# Patient Record
Sex: Male | Born: 1990 | Race: Black or African American | Hispanic: No | Marital: Single | Smoking: Never smoker
Health system: Southern US, Community
[De-identification: ages and names within clinical notes are randomized; demographics above are authoritative.]

## PROBLEM LIST (undated history)

## (undated) DIAGNOSIS — D573 Sickle-cell trait: Secondary | ICD-10-CM

## (undated) DIAGNOSIS — R51 Headache: Secondary | ICD-10-CM

## (undated) DIAGNOSIS — D7218 Eosinophilia in diseases classified elsewhere: Secondary | ICD-10-CM

## (undated) DIAGNOSIS — F419 Anxiety disorder, unspecified: Secondary | ICD-10-CM

## (undated) DIAGNOSIS — M301 Polyarteritis with lung involvement [Churg-Strauss]: Secondary | ICD-10-CM

## (undated) DIAGNOSIS — T7840XA Allergy, unspecified, initial encounter: Secondary | ICD-10-CM

## (undated) DIAGNOSIS — Z889 Allergy status to unspecified drugs, medicaments and biological substances status: Secondary | ICD-10-CM

## (undated) HISTORY — DX: Allergy, unspecified, initial encounter: T78.40XA

## (undated) HISTORY — DX: Headache: R51

---
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
## 2011-02-21 NOTE — Consult Note (Signed)
[facility]  Patient:    Larry Estes, Larry Estes                        MRN: [id number] Proc. Date: 02/14/00 Attending:  Artist Pais. Mina Estes, M.D. CC:         Larry Estes, M.D. 2 copies to office                          Consultation Report  PHYSICIAN REQUESTING CONSULTATION:  Larry Estes, M.D.  REASON FOR CONSULTATION:  Larry Estes is a very pleasant [age]-year-old male, right hand dominant, who sustained a traumatic injury to his dominant right ring finger earlier today, and is here with an open injury of the distal phalanx, nail bed laceration, and open fracture of the distal phalanx.  PAST MEDICAL HISTORY:  He is an otherwise healthy [age]-year-old male with seasonal allergies.  MEDICATIONS:  He is currently on Claritin.  ALLERGIES:  No known drug allergies.  No other medications except Claritin or past surgical history of note.  SOCIAL HISTORY:  Noncontributory.  FAMILY MEDICAL HISTORY:  Noncontributory.  REVIEW OF SYSTEMS:  None significant.  PHYSICAL EXAMINATION:  GENERAL:  Well-developed, well-nourished male, alert and oriented x 3.  HEENT:  Normocephalic, atraumatic.  RIGHT UPPER EXTREMITY:  He has an obvious open injury to his right ring finger with a fracture of the nail plate, a nail bed laceration, an open distal phalangeal fracture, and lacerations on both the radial and ulnar borders of the eponychium.  Also has a volar laceration.  Patients x-rays show no distal dislocation but a distal phalangeal fracture.  The tip of his finger is sensate and he has full FDS and FDP function.  PROCEDURE:  The patient was given a 2% plain lidocaine digital sheath block. Once adequate anesthesia had been obtained, his right hand was prepped and draped in the usual sterile fashion.  A Penrose drain was used as a tourniquet at the base of his finger.  At this point in time, the nail plate was removed from the underlying nail bed.   The fracture site was irrigated and debrided of all clot and all nonviable material, and then at this point in time, a 4-0 Vicryl suture undyed was used to repair the lacerations on the volar aspect of the finger as well as on the radial and ulnar borders of the eponychium.  Once adequate alignment had been obtained, the nail bed was sutured using 6-0 Vicryl in four simple sutures.  The nail plate was then placed back under the eponychial fold.  The patients finger was then dressed in Xeroform, 4x4s, Coban wrap, and a volar extension splint with the DIP joint in extension.  DISPOSITION:  Patient was discharged from the emergency department with Keflex 50 mg 1 p.o. b.i.d. for a week for antibiotic prophylaxis as well as Tylenol with codeine elixir 125 mg/5 cc two teaspoons q.6h. p.r.n. pain., #200 cc bottle, and is to follow up in my office in five to seven days.DD:  02/14/00 TD:  02/15/00 Job: [id number]

## 2011-07-07 LAB — WOUND CULTURE: Gram Stain: NONE SEEN

## 2011-07-08 LAB — RAPID STREP SCREEN (MED CTR MEBANE ONLY): Streptococcus, Group A Screen (Direct): NEGATIVE

## 2011-07-16 LAB — POCT RAPID STREP A: Streptococcus, Group A Screen (Direct): NEGATIVE

## 2011-07-22 LAB — URINALYSIS, ROUTINE W REFLEX MICROSCOPIC
Glucose, UA: NEGATIVE
Hgb urine dipstick: NEGATIVE
Ketones, ur: NEGATIVE
Protein, ur: NEGATIVE

## 2011-07-22 LAB — COMPREHENSIVE METABOLIC PANEL
ALT: 25
Alkaline Phosphatase: 102
CO2: 26
Chloride: 102
Glucose, Bld: 99
Potassium: 3.8
Sodium: 136
Total Protein: 8

## 2011-07-22 LAB — DIFFERENTIAL
Basophils Relative: 1
Eosinophils Absolute: 0.4
Monocytes Absolute: 0.7
Neutrophils Relative %: 40

## 2011-07-22 LAB — CBC
Hemoglobin: 14.8 — ABNORMAL HIGH
RBC: 5.66 — ABNORMAL HIGH
RDW: 16.1 — ABNORMAL HIGH
WBC: 5.5

## 2011-08-30 ENCOUNTER — Emergency Department (HOSPITAL_COMMUNITY)
Admission: EM | Admit: 2011-08-30 | Discharge: 2011-08-30 | Disposition: A | Discharge status: Home / Self Care | Payer: Self-pay | Attending: Emergency Medicine | Admitting: Emergency Medicine

## 2011-08-30 ENCOUNTER — Emergency Department (HOSPITAL_COMMUNITY): Payer: Self-pay

## 2011-08-30 ENCOUNTER — Encounter: Payer: Self-pay | Admitting: Emergency Medicine

## 2011-08-30 DIAGNOSIS — R109 Unspecified abdominal pain: Secondary | ICD-10-CM | POA: Insufficient documentation

## 2011-08-30 DIAGNOSIS — K625 Hemorrhage of anus and rectum: Secondary | ICD-10-CM | POA: Insufficient documentation

## 2011-08-30 DIAGNOSIS — K648 Other hemorrhoids: Secondary | ICD-10-CM | POA: Insufficient documentation

## 2011-08-30 DIAGNOSIS — R10819 Abdominal tenderness, unspecified site: Secondary | ICD-10-CM | POA: Insufficient documentation

## 2011-08-30 HISTORY — DX: Eosinophilia in diseases classified elsewhere: D72.18

## 2011-08-30 HISTORY — DX: Polyarteritis with lung involvement (churg-strauss): M30.1

## 2011-08-30 LAB — CBC
HCT: 42.4 % (ref 39.0–52.0)
MCHC: 34.4 g/dL (ref 30.0–36.0)
MCV: 77.4 fL — ABNORMAL LOW (ref 78.0–100.0)
RDW: 13.5 % (ref 11.5–15.5)

## 2011-08-30 LAB — COMPREHENSIVE METABOLIC PANEL
Albumin: 4 g/dL (ref 3.5–5.2)
BUN: 11 mg/dL (ref 6–23)
Creatinine, Ser: 0.92 mg/dL (ref 0.50–1.35)
Total Protein: 7.6 g/dL (ref 6.0–8.3)

## 2011-08-30 LAB — PROTIME-INR
INR: 0.97 (ref 0.00–1.49)
Prothrombin Time: 13.1 seconds (ref 11.6–15.2)

## 2011-08-30 LAB — TYPE AND SCREEN: Antibody Screen: NEGATIVE

## 2011-08-30 MED ORDER — FENTANYL CITRATE 0.05 MG/ML IJ SOLN
INTRAMUSCULAR | Status: AC
  Administered 2011-08-30: 100 ug via INTRAVENOUS
  Filled 2011-08-30: qty 2

## 2011-08-30 MED ORDER — TRAMADOL-ACETAMINOPHEN 37.5-325 MG PO TABS: ORAL_TABLET | ORAL | Status: AC

## 2011-08-30 MED ORDER — PROMETHAZINE HCL 25 MG PO TABS: 25.0000 mg | ORAL_TABLET | Freq: Four times a day (QID) | ORAL | Status: DC | PRN

## 2011-08-30 MED ORDER — HYDROCORTISONE ACETATE 25 MG RE SUPP: 25.0000 mg | Freq: Two times a day (BID) | RECTAL | Status: AC

## 2011-08-30 MED ORDER — FENTANYL CITRATE 0.05 MG/ML IJ SOLN
100.0000 ug | Freq: Once | INTRAMUSCULAR | Status: AC
  Administered 2011-08-30: 100 ug via INTRAVENOUS

## 2011-08-30 MED ORDER — IOHEXOL 300 MG/ML  SOLN
100.0000 mL | Freq: Once | INTRAMUSCULAR | Status: AC | PRN
  Administered 2011-08-30: 100 mL via INTRAVENOUS

## 2011-08-30 NOTE — ED Notes (Signed)
Pt c/o abd pain, rectal bleeding x 3 months, pt states tonight has BM with bright red and dark red clots. EMTs advised come to ED for eval

## 2011-08-30 NOTE — ED Provider Notes (Signed)
Left knee at change of shift to get CT abdomen pelvis results. Patient relates she's had lower abdominal pain constantly for 3 months and is now having rectal bleeding. He started out as blood when he wiped now he states "I have a constant dripping of blood" when I entered the room nurses had just Tommy patient needed more pain medicine however he is heartily eating  his breakfast in no distress.  Results for orders placed during the hospital encounter of 08/30/11  CBC      Component Value Range   WBC 6.2  4.0 - 10.5 (K/uL)   RBC 5.48  4.22 - 5.81 (MIL/uL)   Hemoglobin 14.6  13.0 - 17.0 (g/dL)   HCT 16.1  09.6 - 04.5 (%)   MCV 77.4 (*) 78.0 - 100.0 (fL)   MCH 26.6  26.0 - 34.0 (pg)   MCHC 34.4  30.0 - 36.0 (g/dL)   RDW 40.9  81.1 - 91.4 (%)   Platelets 272  150 - 400 (K/uL)  COMPREHENSIVE METABOLIC PANEL      Component Value Range   Sodium 140  135 - 145 (mEq/L)   Potassium 3.6  3.5 - 5.1 (mEq/L)   Chloride 102  96 - 112 (mEq/L)   CO2 29  19 - 32 (mEq/L)   Glucose, Bld 95  70 - 99 (mg/dL)   BUN 11  6 - 23 (mg/dL)   Creatinine, Ser 7.82  0.50 - 1.35 (mg/dL)   Calcium 9.9  8.4 - 95.6 (mg/dL)   Total Protein 7.6  6.0 - 8.3 (g/dL)   Albumin 4.0  3.5 - 5.2 (g/dL)   AST 31  0 - 37 (U/L)   ALT 35  0 - 53 (U/L)   Alkaline Phosphatase 62  39 - 117 (U/L)   Total Bilirubin 0.2 (*) 0.3 - 1.2 (mg/dL)   GFR calc non Af Amer >90  >90 (mL/min)   GFR calc Af Amer >90  >90 (mL/min)  PROTIME-INR      Component Value Range   Prothrombin Time 13.1  11.6 - 15.2 (seconds)   INR 0.97  0.00 - 1.49   TYPE AND SCREEN      Component Value Range   ABO/RH(D) A POS     Antibody Screen PENDING     Sample Expiration 09/02/2011    OCCULT BLOOD, POC DEVICE      Component Value Range   Fecal Occult Bld POSITIVE     Laboratory interpretation all normal except for positive Hemoccult for blood  Ct Abdomen Pelvis W Contrast  08/30/2011  *RADIOLOGY REPORT*  Clinical Data: Abdominal pain and rectal bleeding   CT ABDOMEN AND PELVIS WITH CONTRAST  Technique:  Multidetector CT imaging of the abdomen and pelvis was performed following the standard protocol during bolus administration of intravenous contrast.  Contrast: OMNIPAQUE IOHEXOL 300 MG/ML IV SOLN  Comparison: None.  Findings: Visualized lung bases are clear.  Liver is normal.  Spleen is normal.  Pancreas is normal.  The adrenal glands are normal.   The kidneys are normal.  Gallbladder is unremarkable by CT.  No biliary ductal dilation.  Stomach and visualized large and small bowel are unremarkable.  The appendix is identified and appears normal  Abdominal aorta normal is in caliber.  No significant lymphadenopathy.  No free fluid or abnormal fluid collections.  IMPRESSION:  1.  No acute findings. Normal exam.  Original Report Authenticated By: Rosealee Albee, M.D.    Diagnoses that have been  ruled out:  Diagnoses that are still under consideration:  Final diagnoses:  Abdominal pain  Rectal bleeding   Patient's Medications  New Prescriptions   HYDROCORTISONE (ANUSOL-HC) 25 MG SUPPOSITORY    Place 1 suppository (25 mg total) rectally 2 (two) times daily.   PROMETHAZINE (PHENERGAN) 25 MG TABLET    Take 1 tablet (25 mg total) by mouth every 6 (six) hours as needed for nausea.   TRAMADOL-ACETAMINOPHEN (ULTRACET) 37.5-325 MG PER TABLET    2 tabs po QID prn pain  Previous Medications   No medications on file  Modified Medications   No medications on file  Discontinued Medications   No medications on file    Plan discharge and refer to GI (Dr Loreta Ave and Dr Elnoria Howard)   10:30 MOP now in room, upset stating he has trouble having BM's and has to strain. Will give advice on constipation.   Devoria Albe, MD, Armando Gang   Ward Givens, MD 08/30/11 [phone]

## 2011-08-30 NOTE — ED Provider Notes (Addendum)
History     CSN: [id number] Arrival date & time: 08/30/2011  2:05 AM   First MD Initiated Contact with Patient 08/30/11 0510      Chief Complaint  Patient presents with  . Abdominal Pain  . Rectal Bleeding    (Consider location/radiation/quality/duration/timing/severity/associated sxs/prior treatment) HPI 20 year old male presents to emergency department with report of 3 months of rectal bleeding and intermittent lower abdominal pain. Patient reports over the last 24 hours he has had worsening lower abdominal pain, copious amounts of rectal bleeding, and vomiting. Patient denies previously being worked up for same. Patient does not know of any family history of irritable bowel disease. He's had no fevers. Patient reports chronic constipation for which he takes laxatives about twice a week. Patient has not had a bowel movement in 2 days despite laxatives. He denies history of hemorrhoids  Past Medical History  Diagnosis Date  . Allergic angiitis     History reviewed. No pertinent past surgical history.  History reviewed. No pertinent family history.  History  Substance Use Topics  . Smoking status: Never Smoker   . Smokeless tobacco: Not on file  . Alcohol Use: No      Review of Systems  All other systems reviewed and are negative.    Allergies  Review of patient's allergies indicates no known allergies.  Home Medications  No current outpatient prescriptions on file.  BP 136/60  Pulse 81  Temp(Src) 99 F (37.2 C) (Oral)  Resp 19  SpO2 100%  Physical Exam  Nursing note and vitals reviewed. Constitutional: He appears well-developed and well-nourished.  HENT:  Head: Normocephalic and atraumatic.  Right Ear: External ear normal.  Left Ear: External ear normal.  Nose: Nose normal.  Mouth/Throat: Oropharynx is clear and moist.  Eyes: Conjunctivae and EOM are normal. Pupils are equal, round, and reactive to light.  Neck: Normal range of motion. Neck supple. No  JVD present. No tracheal deviation present. No thyromegaly present.  Cardiovascular: Normal rate, regular rhythm, normal heart sounds and intact distal pulses.  Exam reveals no gallop and no friction rub.   No murmur heard. Pulmonary/Chest: Effort normal and breath sounds normal. No stridor. No respiratory distress. He has no wheezes. He has no rales. He exhibits no tenderness.  Abdominal: Soft. Bowel sounds are normal. He exhibits no distension and no mass. There is tenderness. There is no rebound and no guarding.       Tenderness to suprapubic and left lower quadrant with palpation  Genitourinary:       Small internal hemorrhoids noted, nontender bright red blood in the rectum  Musculoskeletal: Normal range of motion. He exhibits no edema and no tenderness.  Lymphadenopathy:    He has no cervical adenopathy.  Skin: Skin is dry. No rash noted. No erythema. No pallor.  Psychiatric: He has a normal mood and affect. His behavior is normal. Judgment and thought content normal.    ED Course  Procedures (including critical care time)   Labs Reviewed  OCCULT BLOOD, POC DEVICE  CBC  COMPREHENSIVE METABOLIC PANEL  PROTIME-INR  TYPE AND SCREEN  POCT OCCULT BLOOD STOOL, DEVICE   No results found.   No diagnosis found.    MDM  20 year old male with 3 months of abdominal pain and intermittent rectal bleeding now worsening. Will get baseline labs, CT abdomen pelvis for possible ulcerative colitis or Crohn's disease. Discussed plan with family wishes if labs and CT scan did not show indications for admission he will need referral  to GI        Olivia Mackie, MD 08/30/11 [phone]  8:22 AM Care passed to Dr. Lynelle Doctor awaiting labs and CT scan  Olivia Mackie, MD 08/30/11 [phone]

## 2012-07-26 ENCOUNTER — Emergency Department (HOSPITAL_COMMUNITY)
Admission: EM | Admit: 2012-07-26 | Discharge: 2012-07-26 | Disposition: A | Discharge status: Home / Self Care | Payer: Medicaid Other | Attending: Emergency Medicine | Admitting: Emergency Medicine

## 2012-07-26 ENCOUNTER — Encounter (HOSPITAL_COMMUNITY): Payer: Self-pay | Admitting: *Deleted

## 2012-07-26 DIAGNOSIS — J069 Acute upper respiratory infection, unspecified: Secondary | ICD-10-CM

## 2012-07-26 DIAGNOSIS — R05 Cough: Secondary | ICD-10-CM | POA: Insufficient documentation

## 2012-07-26 DIAGNOSIS — R059 Cough, unspecified: Secondary | ICD-10-CM | POA: Insufficient documentation

## 2012-07-26 DIAGNOSIS — IMO0001 Reserved for inherently not codable concepts without codable children: Secondary | ICD-10-CM | POA: Insufficient documentation

## 2012-07-26 LAB — RAPID STREP SCREEN (MED CTR MEBANE ONLY): Streptococcus, Group A Screen (Direct): NEGATIVE

## 2012-07-26 MED ORDER — BENZONATATE 100 MG PO CAPS: 100.0000 mg | ORAL_CAPSULE | Freq: Three times a day (TID) | ORAL | Status: DC

## 2012-07-26 MED ORDER — IBUPROFEN 600 MG PO TABS: 600.0000 mg | ORAL_TABLET | Freq: Four times a day (QID) | ORAL | Status: DC | PRN

## 2012-07-26 NOTE — ED Notes (Signed)
Pt states sore throat for the past 2 weeks. Pt states flu like symptoms as well. Pt denies CP, SOB, HA. Pt ambulatory denies productive cough.

## 2012-07-26 NOTE — ED Provider Notes (Signed)
History     CSN: [id number]  Arrival date & time 07/26/12  7829   First MD Initiated Contact with Patient 07/26/12 [phone]      Chief Complaint  Patient presents with  . Sore Throat    (Consider location/radiation/quality/duration/timing/severity/associated sxs/prior treatment) HPI Comments: Larry Estes is a 21 y.o. Male who presents with complaint of a sore throat, body aches, chills, cough for about 1-2 weeks. States it started with a mild cough, but now symptoms are worsening. States chills at home but has not checked his temperature. denies difficulty swallowing or breathing. Denies headache or neck stiffness. States cough is productive with clear thick sputum. Taking over the counter cold medication for the last 3 days with no improvement. Nothing makes symptoms better or worse.   The history is provided by the patient.    Past Medical History  Diagnosis Date  . Allergic angiitis     History reviewed. No pertinent past surgical history.  History reviewed. No pertinent family history.  History  Substance Use Topics  . Smoking status: Never Smoker   . Smokeless tobacco: Not on file  . Alcohol Use: No      Review of Systems  Constitutional: Positive for chills. Negative for fever.  HENT: Positive for congestion and sore throat. Negative for ear pain, trouble swallowing, neck pain, neck stiffness and voice change.   Eyes: Negative for discharge.  Respiratory: Positive for cough. Negative for chest tightness and shortness of breath.   Cardiovascular: Negative for chest pain.  Gastrointestinal: Negative for nausea, vomiting and abdominal distention.  Musculoskeletal: Positive for myalgias.  Skin: Negative for rash.  Neurological: Negative for dizziness, weakness and headaches.  Hematological: Negative for adenopathy.    Allergies  Review of patient's allergies indicates no known allergies.  Home Medications  No current outpatient prescriptions on file.  BP  134/84  Temp 98.1 F (36.7 C) (Oral)  Resp 18  SpO2 100%  Physical Exam  Nursing note and vitals reviewed. Constitutional: He appears well-developed and well-nourished. No distress.  HENT:  Head: Normocephalic.  Right Ear: External ear normal.  Left Ear: External ear normal.  Nose: Nose normal.       TMs normal bilaterally. Pharynx and tonsils erythemotous. No exudate. Uvula midline  Eyes: Conjunctivae normal are normal.  Neck: Normal range of motion. Neck supple.  Cardiovascular: Normal rate, regular rhythm and normal heart sounds.   Pulmonary/Chest: Effort normal and breath sounds normal. No respiratory distress. He has no wheezes. He has no rales.  Musculoskeletal: He exhibits no edema.  Lymphadenopathy:    He has no cervical adenopathy.  Neurological: He is alert.  Skin: Skin is warm and dry.  Psychiatric: He has a normal mood and affect.    ED Course  Procedures (including critical care time)  Pt with URI symptoms, sore throat. No signs of pharyngeal or parapharyngeal abscess. Pt non toxic. Vs normal. Strep pending   Results for orders placed during the hospital encounter of 07/26/12  RAPID STREP SCREEN      Component Value Range   Streptococcus, Group A Screen (Direct) NEGATIVE  NEGATIVE    Strep negative. Will treat symptomatically, with follow up with PCP as needed. Pt afebrile, vs normal. Suspect a viral URI.   Filed Vitals:   07/26/12 0622  BP: 134/84  Temp: 98.1 F (36.7 C)  Resp: 18       1. URI (upper respiratory infection)       MDM  Lottie Mussel, [place] 07/26/12 [phone]

## 2012-07-26 NOTE — ED Provider Notes (Signed)
Medical screening examination/treatment/procedure(s) were performed by non-physician practitioner and as supervising physician I was immediately available for consultation/collaboration.  Olivia Mackie, MD 07/26/12 [phone]

## 2012-10-22 ENCOUNTER — Encounter (HOSPITAL_COMMUNITY): Payer: Self-pay | Admitting: *Deleted

## 2012-10-22 ENCOUNTER — Emergency Department (HOSPITAL_COMMUNITY)
Admission: EM | Admit: 2012-10-22 | Discharge: 2012-10-22 | Disposition: A | Discharge status: Home / Self Care | Payer: Medicaid Other | Attending: Emergency Medicine | Admitting: Emergency Medicine

## 2012-10-22 DIAGNOSIS — Z8709 Personal history of other diseases of the respiratory system: Secondary | ICD-10-CM | POA: Insufficient documentation

## 2012-10-22 DIAGNOSIS — R1013 Epigastric pain: Secondary | ICD-10-CM | POA: Insufficient documentation

## 2012-10-22 DIAGNOSIS — R110 Nausea: Secondary | ICD-10-CM | POA: Insufficient documentation

## 2012-10-22 DIAGNOSIS — IMO0002 Reserved for concepts with insufficient information to code with codable children: Secondary | ICD-10-CM

## 2012-10-22 DIAGNOSIS — R109 Unspecified abdominal pain: Secondary | ICD-10-CM

## 2012-10-22 DIAGNOSIS — L03019 Cellulitis of unspecified finger: Secondary | ICD-10-CM | POA: Insufficient documentation

## 2012-10-22 LAB — CBC WITH DIFFERENTIAL/PLATELET
Basophils Absolute: 0 10*3/uL (ref 0.0–0.1)
Basophils Relative: 0 % (ref 0–1)
Eosinophils Absolute: 0.2 10*3/uL (ref 0.0–0.7)
Eosinophils Relative: 3 % (ref 0–5)
Lymphs Abs: 1.6 10*3/uL (ref 0.7–4.0)
MCH: 28.1 pg (ref 26.0–34.0)
Neutrophils Relative %: 54 % (ref 43–77)
Platelets: 249 10*3/uL (ref 150–400)
RBC: 5.4 MIL/uL (ref 4.22–5.81)
RDW: 14 % (ref 11.5–15.5)

## 2012-10-22 LAB — COMPREHENSIVE METABOLIC PANEL
ALT: 18 U/L (ref 0–53)
AST: 20 U/L (ref 0–37)
Albumin: 4 g/dL (ref 3.5–5.2)
Alkaline Phosphatase: 62 U/L (ref 39–117)
Calcium: 9.6 mg/dL (ref 8.4–10.5)
Glucose, Bld: 77 mg/dL (ref 70–99)
Potassium: 3.8 mEq/L (ref 3.5–5.1)
Sodium: 140 mEq/L (ref 135–145)
Total Protein: 7.7 g/dL (ref 6.0–8.3)

## 2012-10-22 LAB — URINALYSIS, MICROSCOPIC ONLY
Bilirubin Urine: NEGATIVE
Glucose, UA: NEGATIVE mg/dL
Hgb urine dipstick: NEGATIVE
Specific Gravity, Urine: 1.015 (ref 1.005–1.030)
Urine-Other: NONE SEEN
Urobilinogen, UA: 1 mg/dL (ref 0.0–1.0)
pH: 7 (ref 5.0–8.0)

## 2012-10-22 MED ORDER — CEPHALEXIN 500 MG PO CAPS: 500.0000 mg | ORAL_CAPSULE | Freq: Four times a day (QID) | ORAL | Status: DC

## 2012-10-22 MED ORDER — SODIUM CHLORIDE 0.9 % IV BOLUS (SEPSIS)
1000.0000 mL | Freq: Once | INTRAVENOUS | Status: AC
  Administered 2012-10-22: 1000 mL via INTRAVENOUS

## 2012-10-22 MED ORDER — KETOROLAC TROMETHAMINE 30 MG/ML IJ SOLN
30.0000 mg | Freq: Once | INTRAMUSCULAR | Status: AC
  Administered 2012-10-22: 30 mg via INTRAVENOUS
  Filled 2012-10-22: qty 1

## 2012-10-22 MED ORDER — HYDROCODONE-ACETAMINOPHEN 5-325 MG PO TABS
2.0000 | ORAL_TABLET | Freq: Once | ORAL | Status: AC
  Administered 2012-10-22: 2 via ORAL
  Filled 2012-10-22: qty 2

## 2012-10-22 MED ORDER — ONDANSETRON HCL 4 MG/2ML IJ SOLN
4.0000 mg | Freq: Once | INTRAMUSCULAR | Status: AC
  Administered 2012-10-22: 4 mg via INTRAVENOUS
  Filled 2012-10-22: qty 2

## 2012-10-22 MED ORDER — HYDROCODONE-ACETAMINOPHEN 5-325 MG PO TABS: 2.0000 | ORAL_TABLET | Freq: Four times a day (QID) | ORAL | Status: DC | PRN

## 2012-10-22 NOTE — ED Notes (Signed)
Pt reports infection to tip of middle left finger x 2 weeks. Pus noted, swelling noted. Also reports severe abdominal pain and pain up left arm.

## 2012-10-22 NOTE — ED Provider Notes (Signed)
History     CSN: [id number]  Arrival date & time 10/22/12  1401   First MD Initiated Contact with Patient 10/22/12 1530      Chief Complaint  Patient presents with  . Finger Injury  . Abdominal Pain    (Consider location/radiation/quality/duration/timing/severity/associated sxs/prior treatment) HPI Comments: Patient presents with 5 day history of epigastric abd pain, feels nauseated but no vomiting or diarrhea. Also complains of 2 week history of pain to the left middle finger.  No injury or trauma.  It is swollen at the base of the nail and throbs.    Patient is a 22 y.o. male presenting with abdominal pain. The history is provided by the patient.  Abdominal Pain The primary symptoms of the illness include abdominal pain and nausea. The primary symptoms of the illness do not include fever, vomiting, diarrhea or dysuria. Episode onset: 5 days ago. The onset of the illness was gradual. The problem has been gradually worsening.  The patient has not had a change in bowel habit. Symptoms associated with the illness do not include chills, constipation, urgency or hematuria.    Past Medical History  Diagnosis Date  . Allergic angiitis     History reviewed. No pertinent past surgical history.  No family history on file.  History  Substance Use Topics  . Smoking status: Never Smoker   . Smokeless tobacco: Not on file  . Alcohol Use: No      Review of Systems  Constitutional: Negative for fever and chills.  Gastrointestinal: Positive for nausea and abdominal pain. Negative for vomiting, diarrhea and constipation.  Genitourinary: Negative for dysuria, urgency and hematuria.  All other systems reviewed and are negative.    Allergies  Fish allergy  Home Medications  No current outpatient prescriptions on file.  BP 148/84  Pulse 74  Temp 98.4 F (36.9 C) (Oral)  Resp 16  SpO2 98%  Physical Exam  Nursing note and vitals reviewed. Constitutional: He is oriented to  person, place, and time. He appears well-developed and well-nourished. No distress.  HENT:  Head: Normocephalic and atraumatic.  Mouth/Throat: Oropharynx is clear and moist.  Neck: Normal range of motion. Neck supple.  Cardiovascular: Normal rate, regular rhythm and normal heart sounds.   Pulmonary/Chest: Effort normal and breath sounds normal. No respiratory distress.  Abdominal: Soft. Bowel sounds are normal.       There is ttp in the epigastrium and periumbilical region without rebound or guarding.    Musculoskeletal: Normal range of motion. He exhibits no edema.       There is swelling and discoloration to the base of the left third fingernail.    Lymphadenopathy:    He has no cervical adenopathy.  Neurological: He is alert and oriented to person, place, and time.  Skin: Skin is warm and dry. He is not diaphoretic.    ED Course  Procedures (including critical care time)   Labs Reviewed  CBC WITH DIFFERENTIAL  COMPREHENSIVE METABOLIC PANEL  LIPASE, BLOOD  URINALYSIS, MICROSCOPIC ONLY   No results found.   No diagnosis found.    MDM  The patient presents here with abd pain and a painful, swollen finger.  The finger problem is a paronychia which was lanced with a #11 blade with copious pus expressed.  The abd pain is non-specific and the labs are all unremarkable.  I suspect gastroenteritis.  To return prn if he worsens.  Will treat with keflex and vicodin.  Geoffery Lyons, MD 10/22/12 [phone]

## 2012-11-15 ENCOUNTER — Encounter (HOSPITAL_COMMUNITY): Payer: Self-pay | Admitting: *Deleted

## 2012-11-15 ENCOUNTER — Emergency Department (HOSPITAL_COMMUNITY)
Admission: EM | Admit: 2012-11-15 | Discharge: 2012-11-15 | Disposition: A | Discharge status: Home / Self Care | Payer: Medicaid Other | Attending: Emergency Medicine | Admitting: Emergency Medicine

## 2012-11-15 ENCOUNTER — Emergency Department (HOSPITAL_COMMUNITY): Payer: Medicaid Other

## 2012-11-15 DIAGNOSIS — R51 Headache: Secondary | ICD-10-CM | POA: Insufficient documentation

## 2012-11-15 DIAGNOSIS — R109 Unspecified abdominal pain: Secondary | ICD-10-CM | POA: Insufficient documentation

## 2012-11-15 DIAGNOSIS — R42 Dizziness and giddiness: Secondary | ICD-10-CM | POA: Insufficient documentation

## 2012-11-15 DIAGNOSIS — J029 Acute pharyngitis, unspecified: Secondary | ICD-10-CM | POA: Insufficient documentation

## 2012-11-15 DIAGNOSIS — B349 Viral infection, unspecified: Secondary | ICD-10-CM

## 2012-11-15 DIAGNOSIS — J3489 Other specified disorders of nose and nasal sinuses: Secondary | ICD-10-CM | POA: Insufficient documentation

## 2012-11-15 DIAGNOSIS — R197 Diarrhea, unspecified: Secondary | ICD-10-CM | POA: Insufficient documentation

## 2012-11-15 LAB — CBC WITH DIFFERENTIAL/PLATELET
Basophils Relative: 0 % (ref 0–1)
Eosinophils Absolute: 0.2 10*3/uL (ref 0.0–0.7)
Hemoglobin: 14.3 g/dL (ref 13.0–17.0)
MCH: 28 pg (ref 26.0–34.0)
MCHC: 35.4 g/dL (ref 30.0–36.0)
Monocytes Relative: 11 % (ref 3–12)
Neutrophils Relative %: 58 % (ref 43–77)
Platelets: 244 10*3/uL (ref 150–400)
WBC: 7.9 10*3/uL (ref 4.0–10.5)

## 2012-11-15 LAB — COMPREHENSIVE METABOLIC PANEL
Albumin: 3.8 g/dL (ref 3.5–5.2)
Alkaline Phosphatase: 66 U/L (ref 39–117)
BUN: 10 mg/dL (ref 6–23)
Calcium: 9.2 mg/dL (ref 8.4–10.5)
Creatinine, Ser: 1.02 mg/dL (ref 0.50–1.35)
Potassium: 3.8 mEq/L (ref 3.5–5.1)
Total Protein: 7.8 g/dL (ref 6.0–8.3)

## 2012-11-15 LAB — LIPASE, BLOOD: Lipase: 22 U/L (ref 11–59)

## 2012-11-15 MED ORDER — KETOROLAC TROMETHAMINE 30 MG/ML IJ SOLN
30.0000 mg | Freq: Once | INTRAMUSCULAR | Status: AC
  Administered 2012-11-15: 30 mg via INTRAVENOUS
  Filled 2012-11-15: qty 1

## 2012-11-15 MED ORDER — PSEUDOEPHEDRINE HCL 30 MG PO TABS: 30.0000 mg | ORAL_TABLET | ORAL | Status: DC | PRN

## 2012-11-15 MED ORDER — ONDANSETRON 8 MG PO TBDP: 8.0000 mg | ORAL_TABLET | Freq: Three times a day (TID) | ORAL | Status: DC | PRN

## 2012-11-15 MED ORDER — SODIUM CHLORIDE 0.9 % IV BOLUS (SEPSIS)
1000.0000 mL | Freq: Once | INTRAVENOUS | Status: AC
  Administered 2012-11-15: 1000 mL via INTRAVENOUS

## 2012-11-15 MED ORDER — FLUTICASONE PROPIONATE 50 MCG/ACT NA SUSP: 2.0000 | Freq: Every day | NASAL | Status: DC

## 2012-11-15 MED ORDER — ONDANSETRON HCL 4 MG/2ML IJ SOLN
4.0000 mg | Freq: Once | INTRAMUSCULAR | Status: AC
  Administered 2012-11-15: 4 mg via INTRAVENOUS
  Filled 2012-11-15: qty 2

## 2012-11-15 NOTE — ED Notes (Signed)
Pt states that he is having n/v/d since yesterday. He is also c/o a pulsating HA and a rash on his face that itches.

## 2012-11-15 NOTE — ED Provider Notes (Signed)
History     CSN: [id number]  Arrival date & time 11/15/12  0607   First MD Initiated Contact with Patient 11/15/12 [phone]      Chief Complaint  Patient presents with  . Emesis  . Headache    (Consider location/radiation/quality/duration/timing/severity/associated sxs/prior treatment) HPI Larry Estes is a 22 y.o. male who presents to ED complaining of headache, sore throat, congestion, sinus pressure, cough, abdominal pain, nausea, vomiting, diarrhea for 2 wks. States symptoms all getting worse. This morning felt dizzy and so came in. States has not tried anything for his symptoms. Nothing makes his symptoms better or worse. States able to eat just small portions and drinking lots of fluids. States about 5 episodes of diarrhea daily, watery. States coughing up green sputum. States "spitting out" pink sputum. Denies fever. Denies medical problems. Has not taken any medications for his complaints.   Past Medical History  Diagnosis Date  . Allergic angiitis     History reviewed. No pertinent past surgical history.  No family history on file.  History  Substance Use Topics  . Smoking status: Never Smoker   . Smokeless tobacco: Not on file  . Alcohol Use: No      Review of Systems  Constitutional: Negative for fever and chills.  HENT: Positive for congestion and sore throat. Negative for neck pain and neck stiffness.   Respiratory: Positive for cough. Negative for shortness of breath and wheezing.   Cardiovascular: Negative.   Gastrointestinal: Positive for nausea, vomiting, abdominal pain and diarrhea. Negative for blood in stool.  Genitourinary: Negative for dysuria, flank pain and decreased urine volume.  Musculoskeletal: Positive for myalgias.  Skin: Negative.   Allergic/Immunologic: Negative for immunocompromised state.  Neurological: Positive for dizziness, weakness, light-headedness and headaches.  Hematological: Negative.     Allergies  Fish allergy  Home  Medications  No current outpatient prescriptions on file.  BP 156/94  Pulse 56  Temp(Src) 98.5 F (36.9 C)  Resp 20  SpO2 100%  Physical Exam  Nursing note and vitals reviewed. Constitutional: He is oriented to person, place, and time. He appears well-developed and well-nourished. No distress.  HENT:  Head: Normocephalic and atraumatic.  Right Ear: Tympanic membrane, external ear and ear canal normal.  Left Ear: Tympanic membrane, external ear and ear canal normal.  Nose: Rhinorrhea present. Right sinus exhibits no maxillary sinus tenderness and no frontal sinus tenderness. Left sinus exhibits no maxillary sinus tenderness and no frontal sinus tenderness.  Mouth/Throat: Oropharynx is clear and moist.  Eyes: Conjunctivae are normal.  Neck: Normal range of motion. Neck supple.  Cardiovascular: Normal rate, regular rhythm and normal heart sounds.   Pulmonary/Chest: Effort normal and breath sounds normal. No respiratory distress. He has no wheezes. He has no rales.  Abdominal: Soft. Bowel sounds are normal. He exhibits no distension. There is no tenderness. There is no rebound.  Musculoskeletal: He exhibits no edema.  Lymphadenopathy:    He has no cervical adenopathy.  Neurological: He is alert and oriented to person, place, and time.  Skin: Skin is warm and dry.  Psychiatric: He has a normal mood and affect.    ED Course  Procedures (including critical care time)  Results for orders placed during the hospital encounter of 11/15/12  CBC WITH DIFFERENTIAL      Result Value Range   WBC 7.9  4.0 - 10.5 K/uL   RBC 5.10  4.22 - 5.81 MIL/uL   Hemoglobin 14.3  13.0 - 17.0 g/dL   HCT  40.4  39.0 - 52.0 %   MCV 79.2  78.0 - 100.0 fL   MCH 28.0  26.0 - 34.0 pg   MCHC 35.4  30.0 - 36.0 g/dL   RDW 04.5  40.9 - 81.1 %   Platelets 244  150 - 400 K/uL   Neutrophils Relative 58  43 - 77 %   Neutro Abs 4.6  1.7 - 7.7 K/uL   Lymphocytes Relative 30  12 - 46 %   Lymphs Abs 2.3  0.7 - 4.0  K/uL   Monocytes Relative 11  3 - 12 %   Monocytes Absolute 0.8  0.1 - 1.0 K/uL   Eosinophils Relative 2  0 - 5 %   Eosinophils Absolute 0.2  0.0 - 0.7 K/uL   Basophils Relative 0  0 - 1 %   Basophils Absolute 0.0  0.0 - 0.1 K/uL  COMPREHENSIVE METABOLIC PANEL      Result Value Range   Sodium 133 (*) 135 - 145 mEq/L   Potassium 3.8  3.5 - 5.1 mEq/L   Chloride 97  96 - 112 mEq/L   CO2 26  19 - 32 mEq/L   Glucose, Bld 95  70 - 99 mg/dL   BUN 10  6 - 23 mg/dL   Creatinine, Ser 9.14  0.50 - 1.35 mg/dL   Calcium 9.2  8.4 - 78.2 mg/dL   Total Protein 7.8  6.0 - 8.3 g/dL   Albumin 3.8  3.5 - 5.2 g/dL   AST 23  0 - 37 U/L   ALT 30  0 - 53 U/L   Alkaline Phosphatase 66  39 - 117 U/L   Total Bilirubin 0.5  0.3 - 1.2 mg/dL   GFR calc non Af Amer >90  >90 mL/min   GFR calc Af Amer >90  >90 mL/min  LIPASE, BLOOD      Result Value Range   Lipase 22  11 - 59 U/L   Dg Chest 2 View  11/15/2012  *RADIOLOGY REPORT*  Clinical Data: Cough and congestion.  CHEST - 2 VIEW  Comparison: 06/30/2005  Findings: The lungs are clear without focal consolidation, edema, effusion or pneumothorax.  Cardiopericardial silhouette is within normal limits for size.  Imaged bony structures of the thorax are intact.  IMPRESSION: Stable.  Normal exam.   Original Report Authenticated By: Kennith Center, M.D.       1. Viral syndrome       MDM  Pt with nasal congestion, sore throat, cough, n/v/d. Abdomen is soft on exam. Non tender. Doubt acute abdomen. Lungs clear. No meningismus. VS normal. Pt not orthostatic. Labs all normal. Pt hydrated here in ED. zofran and toradol given for his headache and nausea. He is in no distress. Non toxic. Suspect a viral syndrome. Will treat symptomatically.   Filed Vitals:   11/15/12 0615 11/15/12 0650 11/15/12 0653 11/15/12 0655  BP: 156/94 122/70 128/66 130/78  Pulse: 56 57 57 63  Temp: 98.5 F (36.9 C)     Resp: 20     SpO2: 100%              Lottie Mussel,  PA 11/15/12 1415  Gissel Keilman A Allen [place], PA 11/15/12 1428

## 2012-11-15 NOTE — ED Notes (Signed)
Headache; vomiting/diarrhea; sore throat

## 2012-11-15 NOTE — ED Notes (Signed)
Voiced understanding of instructions given 

## 2012-11-15 NOTE — ED Notes (Signed)
Pt c/o burning above IV site. Assessed site - clean, dry and intact, flushes well. NS bolus completed. IV saline locked, will re-assess

## 2012-11-18 NOTE — ED Provider Notes (Signed)
Medical screening examination/treatment/procedure(s) were performed by non-physician practitioner and as supervising physician I was immediately available for consultation/collaboration.  Sugey Trevathan, MD 11/18/12 0746 

## 2013-01-06 ENCOUNTER — Encounter (HOSPITAL_COMMUNITY): Payer: Self-pay | Admitting: *Deleted

## 2013-01-06 ENCOUNTER — Emergency Department (HOSPITAL_COMMUNITY)
Admission: EM | Admit: 2013-01-06 | Discharge: 2013-01-06 | Disposition: A | Discharge status: Home Health | Payer: Self-pay | Attending: Emergency Medicine | Admitting: Emergency Medicine

## 2013-01-06 DIAGNOSIS — R109 Unspecified abdominal pain: Secondary | ICD-10-CM | POA: Insufficient documentation

## 2013-01-06 DIAGNOSIS — K921 Melena: Secondary | ICD-10-CM | POA: Insufficient documentation

## 2013-01-06 DIAGNOSIS — R1110 Vomiting, unspecified: Secondary | ICD-10-CM | POA: Insufficient documentation

## 2013-01-06 DIAGNOSIS — K5900 Constipation, unspecified: Secondary | ICD-10-CM | POA: Insufficient documentation

## 2013-01-06 DIAGNOSIS — K625 Hemorrhage of anus and rectum: Secondary | ICD-10-CM | POA: Insufficient documentation

## 2013-01-06 LAB — CBC
HCT: 39.9 % (ref 39.0–52.0)
Hemoglobin: 14.4 g/dL (ref 13.0–17.0)
RBC: 4.98 MIL/uL (ref 4.22–5.81)
WBC: 7.8 10*3/uL (ref 4.0–10.5)

## 2013-01-06 LAB — COMPREHENSIVE METABOLIC PANEL
ALT: 24 U/L (ref 0–53)
Alkaline Phosphatase: 51 U/L (ref 39–117)
BUN: 13 mg/dL (ref 6–23)
CO2: 26 mEq/L (ref 19–32)
Chloride: 103 mEq/L (ref 96–112)
GFR calc Af Amer: >90 mL/min (ref >90)
Glucose, Bld: 115 mg/dL — ABNORMAL HIGH (ref 70–99)
Potassium: 3.4 mEq/L — ABNORMAL LOW (ref 3.5–5.1)
Total Bilirubin: 0.3 mg/dL (ref 0.3–1.2)

## 2013-01-06 LAB — LIPASE, BLOOD: Lipase: 29 U/L (ref 11–59)

## 2013-01-06 MED ORDER — ONDANSETRON 4 MG PO TBDP
8.0000 mg | ORAL_TABLET | Freq: Once | ORAL | Status: AC
  Administered 2013-01-06: 8 mg via ORAL
  Filled 2013-01-06: qty 2

## 2013-01-06 MED ORDER — HYDROCODONE-ACETAMINOPHEN 5-325 MG PO TABS
2.0000 | ORAL_TABLET | Freq: Once | ORAL | Status: AC
  Administered 2013-01-06: 2 via ORAL
  Filled 2013-01-06: qty 2

## 2013-01-06 MED ORDER — POTASSIUM CHLORIDE CRYS ER 20 MEQ PO TBCR
20.0000 meq | EXTENDED_RELEASE_TABLET | Freq: Once | ORAL | Status: AC
  Administered 2013-01-06: 20 meq via ORAL
  Filled 2013-01-06: qty 1

## 2013-01-06 NOTE — ED Notes (Signed)
Patient states ongoing issues with constipation and now has noticed blood in stool when able to have bm, patient also vomiting x few days as well

## 2013-01-06 NOTE — ED Provider Notes (Signed)
History     CSN: [id number]  Arrival date & time 01/06/13  0345   First MD Initiated Contact with Patient 01/06/13 [phone]      Chief Complaint  Patient presents with  . Constipation  . Rectal Bleeding    (Consider location/radiation/quality/duration/timing/severity/associated sxs/prior treatment) Patient is a 22 y.o. male presenting with constipation and hematochezia. The history is provided by the patient.  Constipation  Associated symptoms include abdominal pain and vomiting. Pertinent negatives include no fever, no hematuria, no chest pain, no headaches and no rash.  Rectal Bleeding  Associated symptoms include abdominal pain and vomiting. Pertinent negatives include no fever, no hematuria, no chest pain, no headaches and no rash.  pt states noted recent streaks brb in bms. Notes hx same. Denies specific prior diagnoses. No melena. ?hx hemorrhoids. No hx pud, avm or diverticula. Denies nsaid, or asa use. No etoh use. States recent mildly constipation/straining, with last bm 2 days ago. No distension. C/o intermittent, crampy mid abd pain. No radiation. No specific exacerbating or alleviating factors. Cannot relate to eating or certain foods. No recent wt loss. No prior abd surgery. Denies gu c/o. No fever or chills. No rectal trauma. Denies faintness or lightheadedness. Has had normal appetite. Does note couple episodes of recent emesis, not bloody or bilious. No known ill contacts or bad food ingestion. No recent abx or new meds. Denies hx celiac dis, food allergies, inflammatory bowel, or irritable bowel.   Past Medical History  Diagnosis Date  . Allergic angiitis     History reviewed. No pertinent past surgical history.  No family history on file.  History  Substance Use Topics  . Smoking status: Never Smoker   . Smokeless tobacco: Not on file  . Alcohol Use: No      Review of Systems  Constitutional: Negative for fever and chills.  HENT: Negative for neck pain.   Eyes:  Negative for redness.  Respiratory: Negative for shortness of breath.   Cardiovascular: Negative for chest pain.  Gastrointestinal: Positive for vomiting, abdominal pain, constipation, blood in stool and hematochezia.  Genitourinary: Negative for dysuria, hematuria and flank pain.  Musculoskeletal: Negative for back pain.  Skin: Negative for rash.  Neurological: Negative for light-headedness and headaches.  Hematological: Does not bruise/bleed easily.  Psychiatric/Behavioral: Negative for confusion.    Allergies  Fish allergy  Home Medications   Current Outpatient Rx  Name  Route  Sig  Dispense  Refill  . fluticasone (FLONASE) 50 MCG/ACT nasal spray   Nasal   Place 2 sprays into the nose daily.   16 g   2   . ondansetron (ZOFRAN ODT) 8 MG disintegrating tablet   Oral   Take 1 tablet (8 mg total) by mouth every 8 (eight) hours as needed for nausea.   12 tablet   0   . pseudoephedrine (SUDAFED) 30 MG tablet   Oral   Take 1 tablet (30 mg total) by mouth every 4 (four) hours as needed for congestion.   30 tablet   0     BP 130/69  Pulse 86  Temp(Src) 98.6 F (37 C) (Oral)  Resp 18  SpO2 98%  Physical Exam  Nursing note and vitals reviewed. Constitutional: He is oriented to person, place, and time. He appears well-developed and well-nourished. No distress.  HENT:  Head: Atraumatic.  Mouth/Throat: Oropharynx is clear and moist.  Eyes: Conjunctivae are normal. No scleral icterus.  Neck: Neck supple. No tracheal deviation present.  Cardiovascular: Normal rate,  regular rhythm, normal heart sounds and intact distal pulses.   Pulmonary/Chest: Effort normal and breath sounds normal. No accessory muscle usage. No respiratory distress.  Abdominal: Soft. Bowel sounds are normal. He exhibits no distension and no mass. There is no tenderness. There is no rebound and no guarding.  Genitourinary:  No acutely thrombosed hemorrhoids or external hemorrhoids. No fissue. Scant amt  brown soft stool on rectal, streak of blood, heme pos.   Musculoskeletal: Normal range of motion. He exhibits no edema and no tenderness.  Neurological: He is alert and oriented to person, place, and time.  Skin: Skin is warm and dry. No rash noted. He is not diaphoretic.  Psychiatric: He has a normal mood and affect.    ED Course  Procedures (including critical care time)   Results for orders placed during the hospital encounter of 01/06/13  CBC      Result Value Range   WBC 7.8  4.0 - 10.5 K/uL   RBC 4.98  4.22 - 5.81 MIL/uL   Hemoglobin 14.4  13.0 - 17.0 g/dL   HCT 16.1  09.6 - 04.5 %   MCV 80.1  78.0 - 100.0 fL   MCH 28.9  26.0 - 34.0 pg   MCHC 36.1 (*) 30.0 - 36.0 g/dL   RDW 40.9  81.1 - 91.4 %   Platelets 239  150 - 400 K/uL  COMPREHENSIVE METABOLIC PANEL      Result Value Range   Sodium 139  135 - 145 mEq/L   Potassium 3.4 (*) 3.5 - 5.1 mEq/L   Chloride 103  96 - 112 mEq/L   CO2 26  19 - 32 mEq/L   Glucose, Bld 115 (*) 70 - 99 mg/dL   BUN 13  6 - 23 mg/dL   Creatinine, Ser 7.82  0.50 - 1.35 mg/dL   Calcium 9.3  8.4 - 95.6 mg/dL   Total Protein 7.0  6.0 - 8.3 g/dL   Albumin 3.9  3.5 - 5.2 g/dL   AST 40 (*) 0 - 37 U/L   ALT 24  0 - 53 U/L   Alkaline Phosphatase 51  39 - 117 U/L   Total Bilirubin 0.3  0.3 - 1.2 mg/dL   GFR calc non Af Amer 89 (*) >90 mL/min   GFR calc Af Amer >90  >90 mL/min  LIPASE, BLOOD      Result Value Range   Lipase 29  11 - 59 U/L  OCCULT BLOOD, POC DEVICE      Result Value Range   Fecal Occult Bld POSITIVE (*) NEGATIVE      MDM  Labs.  Reviewed nursing notes and prior charts for additional history.   Noted prior ed eval for same in past couple yrs, was referred to gi, states had not followed up then.  Pt reports negative endoscopy at age 24.   States hx chronic, intermittent gi issues/symptoms, but denies prior specific dx.   Discussed plan for gi f/u w pt.   Recheck no nv in ed.   Pt ask for pain med, hydrocodone  po.  Recheck abd soft nt.  No nv in ed.  Lab unremarkable. k sl low, kcl po.  Pt appears stable for d/c. Tolerating po fluids w benign abd exam.  Will refer to gi, discussed need for close f/u w pt.       Suzi Roots, MD 01/06/13 [phone]

## 2013-01-20 ENCOUNTER — Emergency Department (HOSPITAL_COMMUNITY)
Admission: EM | Admit: 2013-01-20 | Discharge: 2013-01-21 | Disposition: A | Discharge status: Home / Self Care | Payer: Self-pay | Attending: Emergency Medicine | Admitting: Emergency Medicine

## 2013-01-20 ENCOUNTER — Encounter (HOSPITAL_COMMUNITY): Payer: Self-pay | Admitting: Emergency Medicine

## 2013-01-20 DIAGNOSIS — R197 Diarrhea, unspecified: Secondary | ICD-10-CM | POA: Insufficient documentation

## 2013-01-20 DIAGNOSIS — K921 Melena: Secondary | ICD-10-CM | POA: Insufficient documentation

## 2013-01-20 DIAGNOSIS — R112 Nausea with vomiting, unspecified: Secondary | ICD-10-CM | POA: Insufficient documentation

## 2013-01-20 DIAGNOSIS — R059 Cough, unspecified: Secondary | ICD-10-CM | POA: Insufficient documentation

## 2013-01-20 DIAGNOSIS — Z862 Personal history of diseases of the blood and blood-forming organs and certain disorders involving the immune mechanism: Secondary | ICD-10-CM | POA: Insufficient documentation

## 2013-01-20 DIAGNOSIS — J189 Pneumonia, unspecified organism: Secondary | ICD-10-CM

## 2013-01-20 DIAGNOSIS — R109 Unspecified abdominal pain: Secondary | ICD-10-CM | POA: Insufficient documentation

## 2013-01-20 DIAGNOSIS — Z8709 Personal history of other diseases of the respiratory system: Secondary | ICD-10-CM | POA: Insufficient documentation

## 2013-01-20 DIAGNOSIS — J3489 Other specified disorders of nose and nasal sinuses: Secondary | ICD-10-CM | POA: Insufficient documentation

## 2013-01-20 DIAGNOSIS — R05 Cough: Secondary | ICD-10-CM | POA: Insufficient documentation

## 2013-01-20 DIAGNOSIS — J029 Acute pharyngitis, unspecified: Secondary | ICD-10-CM | POA: Insufficient documentation

## 2013-01-20 DIAGNOSIS — R52 Pain, unspecified: Secondary | ICD-10-CM | POA: Insufficient documentation

## 2013-01-20 HISTORY — DX: Allergy status to unspecified drugs, medicaments and biological substances: Z88.9

## 2013-01-20 HISTORY — DX: Sickle-cell trait: D57.3

## 2013-01-20 NOTE — ED Notes (Signed)
PT. REPORTS EMESIS /DIARRHEA WITH BODY ACHES , NECK PAIN , HEADACHE PRODUCTIVE COUGH FOR SEVERAL DAYS .

## 2013-01-21 ENCOUNTER — Emergency Department (HOSPITAL_COMMUNITY): Payer: Self-pay

## 2013-01-21 LAB — URINALYSIS, ROUTINE W REFLEX MICROSCOPIC
Leukocytes, UA: NEGATIVE
Nitrite: NEGATIVE
Specific Gravity, Urine: 1.015 (ref 1.005–1.030)
pH: 6.5 (ref 5.0–8.0)

## 2013-01-21 LAB — CBC WITH DIFFERENTIAL/PLATELET
Basophils Relative: 1 % (ref 0–1)
Eosinophils Absolute: 0.3 10*3/uL (ref 0.0–0.7)
Hemoglobin: 16 g/dL (ref 13.0–17.0)
MCH: 28.9 pg (ref 26.0–34.0)
MCHC: 36.5 g/dL — ABNORMAL HIGH (ref 30.0–36.0)
Monocytes Relative: 15 % — ABNORMAL HIGH (ref 3–12)
Neutrophils Relative %: 35 % — ABNORMAL LOW (ref 43–77)
Platelets: 226 10*3/uL (ref 150–400)
RDW: 13.6 % (ref 11.5–15.5)

## 2013-01-21 LAB — COMPREHENSIVE METABOLIC PANEL
ALT: 23 U/L (ref 0–53)
BUN: 11 mg/dL (ref 6–23)
Calcium: 9.9 mg/dL (ref 8.4–10.5)
GFR calc Af Amer: >90 mL/min (ref >90)
Glucose, Bld: 93 mg/dL (ref 70–99)
Sodium: 141 mEq/L (ref 135–145)
Total Protein: 7.8 g/dL (ref 6.0–8.3)

## 2013-01-21 MED ORDER — MORPHINE SULFATE 4 MG/ML IJ SOLN
4.0000 mg | Freq: Once | INTRAMUSCULAR | Status: AC
  Administered 2013-01-21: 4 mg via INTRAVENOUS
  Filled 2013-01-21: qty 1

## 2013-01-21 MED ORDER — ONDANSETRON 4 MG PO TBDP: ORAL_TABLET | ORAL | Status: DC

## 2013-01-21 MED ORDER — IOHEXOL 300 MG/ML  SOLN
50.0000 mL | Freq: Once | INTRAMUSCULAR | Status: AC | PRN
  Administered 2013-01-21: 50 mL via ORAL

## 2013-01-21 MED ORDER — SODIUM CHLORIDE 0.9 % IV BOLUS (SEPSIS)
1000.0000 mL | Freq: Once | INTRAVENOUS | Status: AC
  Administered 2013-01-21: 1000 mL via INTRAVENOUS

## 2013-01-21 MED ORDER — PROMETHAZINE HCL 25 MG/ML IJ SOLN
25.0000 mg | Freq: Once | INTRAMUSCULAR | Status: AC
  Administered 2013-01-21: 25 mg via INTRAVENOUS
  Filled 2013-01-21: qty 1

## 2013-01-21 MED ORDER — ONDANSETRON HCL 4 MG/2ML IJ SOLN
4.0000 mg | Freq: Once | INTRAMUSCULAR | Status: AC
  Administered 2013-01-21: 4 mg via INTRAVENOUS
  Filled 2013-01-21: qty 2

## 2013-01-21 MED ORDER — HYDROCODONE-ACETAMINOPHEN 5-325 MG PO TABS: 2.0000 | ORAL_TABLET | ORAL | Status: DC | PRN

## 2013-01-21 MED ORDER — LEVOFLOXACIN 750 MG PO TABS: 750.0000 mg | ORAL_TABLET | Freq: Every day | ORAL | Status: DC

## 2013-01-21 MED ORDER — IOHEXOL 300 MG/ML  SOLN
100.0000 mL | Freq: Once | INTRAMUSCULAR | Status: AC | PRN
  Administered 2013-01-21: 100 mL via INTRAVENOUS

## 2013-01-21 MED ORDER — HYDROMORPHONE HCL PF 1 MG/ML IJ SOLN
1.0000 mg | Freq: Once | INTRAMUSCULAR | Status: AC
  Administered 2013-01-21: 1 mg via INTRAVENOUS
  Filled 2013-01-21: qty 1

## 2013-01-21 NOTE — ED Notes (Signed)
Pt given mouth swab and petroleum jelly for lips; mom given soda.

## 2013-01-21 NOTE — ED Notes (Signed)
Pt c/o increased nausea when drinking contrast, EDP aware

## 2013-01-21 NOTE — ED Notes (Signed)
Upon this RN's arrival to pt's room, pt smoking e-cigarette. Pt instructed that this is a smoke-free campus and told to refrain from smoking while on the premisis.

## 2013-01-21 NOTE — ED Provider Notes (Signed)
History     CSN: [id number]  Arrival date & time 01/20/13  2036   First MD Initiated Contact with Patient 01/21/13 0131      Chief Complaint  Patient presents with  . Emesis  . Generalized Body Aches    (Consider location/radiation/quality/duration/timing/severity/associated sxs/prior treatment) HPI Comments: 22 yo AA male w/ PMH of sickle cell trait presents to the ED with complaints of emesis and generalized body aches x 2 days and abdominal pain x several days -acutely worse over the past few days. Pain is on the right side - umbilicus level, and is described as a waxing and waning pinching that is now a stabbing constant pain. He says the pain radiates to his lower back and rates his pain as 10/10. He is also complaining of sinus congestion, cough productive of green/yellow sputum, SOB when coughing and pleuritic pain after cough. Pt has some nausea, emesis, and reports 2 BM with BRBPR.  ROS-  admits to chills, fatigue, HA,bright red blood in stool and dyspepsia; denies dizziness,chest pain,palpitations, urinary freq, dysuria, hematuria  Patient is a 22 y.o. male presenting with vomiting. The history is provided by the patient.  Emesis Associated symptoms: abdominal pain, diarrhea and sore throat     Past Medical History  Diagnosis Date  . Allergic angiitis   . Multiple allergies   . Sickle cell trait     History reviewed. No pertinent past surgical history.  No family history on file.  History  Substance Use Topics  . Smoking status: Never Smoker   . Smokeless tobacco: Not on file  . Alcohol Use: No      Review of Systems  Constitutional: Negative for activity change and appetite change.  HENT: Positive for congestion and sore throat.   Respiratory: Positive for cough. Negative for shortness of breath and wheezing.   Cardiovascular: Negative for chest pain.  Gastrointestinal: Positive for nausea, vomiting, abdominal pain, diarrhea and blood in stool.   Genitourinary: Negative for dysuria.  Skin: Negative for rash.  Neurological: Negative for dizziness.  Hematological: Does not bruise/bleed easily.    Allergies  Fish allergy  Home Medications  No current outpatient prescriptions on file.  BP 122/68  Pulse 52  Temp(Src) 98.9 F (37.2 C) (Oral)  Resp 16  SpO2 97%  Physical Exam  Nursing note and vitals reviewed. Constitutional: He is oriented to person, place, and time. He appears well-developed.  HENT:  Head: Normocephalic and atraumatic.  Mouth/Throat: Oropharynx is clear and moist.  Eyes: Conjunctivae and EOM are normal. Pupils are equal, round, and reactive to light.  Neck: Normal range of motion. Neck supple. No JVD present.  Cardiovascular: Normal rate and regular rhythm.   Pulmonary/Chest: Effort normal and breath sounds normal. He has no wheezes.  Abdominal: Soft. Bowel sounds are normal. He exhibits no distension. There is tenderness. There is guarding. There is no rebound.  Diffuse right sided abd tenderness, including mcburneys  Lymphadenopathy:    He has no cervical adenopathy.  Neurological: He is alert and oriented to person, place, and time.  Skin: Skin is warm.    ED Course  Procedures (including critical care time)  Labs Reviewed  CBC WITH DIFFERENTIAL - Abnormal; Notable for the following:    MCHC 36.5 (*)    Neutrophils Relative 35 (*)    Monocytes Relative 15 (*)    All other components within normal limits  LIPASE, BLOOD  COMPREHENSIVE METABOLIC PANEL  URINALYSIS, ROUTINE W REFLEX MICROSCOPIC   Dg Chest  2 View  01/21/2013  *RADIOLOGY REPORT*  Clinical Data: Chest pain, shortness of breath, cough and chills. Right upper quadrant abdominal pain and bloody stools.  CHEST - 2 VIEW  Comparison: Chest radiograph performed 11/15/2012  Findings: The lungs are well-aerated and clear.  There is no evidence of focal opacification, pleural effusion or pneumothorax.  The heart is normal in size; the  mediastinal contour is within normal limits.  No acute osseous abnormalities are seen.  IMPRESSION: No acute cardiopulmonary process seen.   Original Report Authenticated By: Tonia Ghent, M.D.      No diagnosis found.    MDM  Pt comes in with cc of abd pain. Pt has some viral syndrome like sx - and in addition abd pain, generalized x 2+ weeks now that is worse today. He also had 2 BRBPR. No risk factors for UGIB.  Pt has no hx of renal stones. No uti like sx. I am unsure what the cause of his pain is at this time. We will get CT scan of the abdomen, which if negative- likely no significant pathology suspected.  Will treat rest of the symptoms symptomatically.    Derwood Kaplan, MD 01/21/13 [phone]

## 2013-01-21 NOTE — ED Notes (Signed)
Pt returned from xray

## 2013-01-21 NOTE — ED Notes (Signed)
Patient transported to CT 

## 2013-01-21 NOTE — ED Notes (Signed)
PT ambulated with baseline gait; VSS; A&Ox3; no signs of distress; respirations even and unlabored; skin warm and dry; no questions upon discharge.  

## 2013-01-21 NOTE — ED Notes (Signed)
Notified CT that pt is drinking 2nd cup of contrast.

## 2013-01-24 ENCOUNTER — Encounter (HOSPITAL_COMMUNITY): Payer: Self-pay | Admitting: Emergency Medicine

## 2013-01-24 ENCOUNTER — Emergency Department (HOSPITAL_COMMUNITY)
Admission: EM | Admit: 2013-01-24 | Discharge: 2013-01-24 | Disposition: A | Discharge status: Home / Self Care | Payer: Self-pay | Attending: Emergency Medicine | Admitting: Emergency Medicine

## 2013-01-24 DIAGNOSIS — R197 Diarrhea, unspecified: Secondary | ICD-10-CM | POA: Insufficient documentation

## 2013-01-24 DIAGNOSIS — R5381 Other malaise: Secondary | ICD-10-CM | POA: Insufficient documentation

## 2013-01-24 DIAGNOSIS — Z8679 Personal history of other diseases of the circulatory system: Secondary | ICD-10-CM | POA: Insufficient documentation

## 2013-01-24 DIAGNOSIS — R110 Nausea: Secondary | ICD-10-CM

## 2013-01-24 DIAGNOSIS — R6884 Jaw pain: Secondary | ICD-10-CM | POA: Insufficient documentation

## 2013-01-24 DIAGNOSIS — R059 Cough, unspecified: Secondary | ICD-10-CM | POA: Insufficient documentation

## 2013-01-24 DIAGNOSIS — R1084 Generalized abdominal pain: Secondary | ICD-10-CM | POA: Insufficient documentation

## 2013-01-24 DIAGNOSIS — R112 Nausea with vomiting, unspecified: Secondary | ICD-10-CM | POA: Insufficient documentation

## 2013-01-24 DIAGNOSIS — Z9109 Other allergy status, other than to drugs and biological substances: Secondary | ICD-10-CM | POA: Insufficient documentation

## 2013-01-24 DIAGNOSIS — D573 Sickle-cell trait: Secondary | ICD-10-CM | POA: Insufficient documentation

## 2013-01-24 DIAGNOSIS — R0602 Shortness of breath: Secondary | ICD-10-CM | POA: Insufficient documentation

## 2013-01-24 DIAGNOSIS — J029 Acute pharyngitis, unspecified: Secondary | ICD-10-CM | POA: Insufficient documentation

## 2013-01-24 DIAGNOSIS — R05 Cough: Secondary | ICD-10-CM | POA: Insufficient documentation

## 2013-01-24 LAB — COMPREHENSIVE METABOLIC PANEL
AST: 24 U/L (ref 0–37)
Albumin: 3.8 g/dL (ref 3.5–5.2)
Alkaline Phosphatase: 51 U/L (ref 39–117)
Chloride: 100 mEq/L (ref 96–112)
Potassium: 4.2 mEq/L (ref 3.5–5.1)
Sodium: 137 mEq/L (ref 135–145)
Total Bilirubin: 0.5 mg/dL (ref 0.3–1.2)

## 2013-01-24 LAB — CBC WITH DIFFERENTIAL/PLATELET
Basophils Absolute: 0.1 10*3/uL (ref 0.0–0.1)
Eosinophils Absolute: 0.3 10*3/uL (ref 0.0–0.7)
Eosinophils Relative: 6 % — ABNORMAL HIGH (ref 0–5)
Lymphs Abs: 2.7 10*3/uL (ref 0.7–4.0)
MCH: 28.6 pg (ref 26.0–34.0)
MCV: 78.9 fL (ref 78.0–100.0)
Monocytes Absolute: 0.5 10*3/uL (ref 0.1–1.0)
Platelets: 216 10*3/uL (ref 150–400)
RDW: 13 % (ref 11.5–15.5)

## 2013-01-24 MED ORDER — PROMETHAZINE HCL 25 MG PO TABS
25.0000 mg | ORAL_TABLET | Freq: Once | ORAL | Status: AC
  Administered 2013-01-24: 25 mg via ORAL
  Filled 2013-01-24: qty 1

## 2013-01-24 MED ORDER — PROMETHAZINE HCL 25 MG PO TABS: 25.0000 mg | ORAL_TABLET | Freq: Three times a day (TID) | ORAL | Status: DC | PRN

## 2013-01-24 MED ORDER — BENZOCAINE 20 % MT SOLN
Freq: Once | OROMUCOSAL | Status: AC
  Administered 2013-01-24: 10:00:00 via OROMUCOSAL
  Filled 2013-01-24: qty 57

## 2013-01-24 MED ORDER — DICYCLOMINE HCL 10 MG PO CAPS
10.0000 mg | ORAL_CAPSULE | Freq: Once | ORAL | Status: AC
  Administered 2013-01-24: 10 mg via ORAL
  Filled 2013-01-24: qty 1

## 2013-01-24 MED ORDER — DICYCLOMINE HCL 10 MG PO CAPS: 10.0000 mg | ORAL_CAPSULE | Freq: Three times a day (TID) | ORAL | Status: DC

## 2013-01-24 MED ORDER — METOCLOPRAMIDE HCL 10 MG PO TABS
10.0000 mg | ORAL_TABLET | Freq: Once | ORAL | Status: AC
  Administered 2013-01-24: 10 mg via ORAL
  Filled 2013-01-24 (×2): qty 1

## 2013-01-24 MED ORDER — ONDANSETRON 4 MG PO TBDP: 4.0000 mg | ORAL_TABLET | Freq: Once | ORAL | Status: DC

## 2013-01-24 MED ORDER — METOCLOPRAMIDE HCL 10 MG PO TABS: 10.0000 mg | ORAL_TABLET | Freq: Three times a day (TID) | ORAL | Status: DC | PRN

## 2013-01-24 NOTE — ED Provider Notes (Signed)
History     CSN: [id number]  Arrival date & time 01/24/13  8413   First MD Initiated Contact with Patient 01/24/13 [phone]      Chief Complaint  Patient presents with  . Nausea  . Emesis    (Consider location/radiation/quality/duration/timing/severity/associated sxs/prior treatment) HPI 22 yo M presenting to the ED with abdominal pain, nausea and vomiting.  He was seen for the same symptoms 4 days ago and discharged with zofran.  CT abd/pelvis at that time was normal without signs of appendicitis or cholecystitis.  He returns today complaining of continued nausea and vomiting, especially when he tries to eat.  The abdominal pain is constant, feels like "someone is stabbing me."  He states that if it worse after he tries to eat and seems to start in the lower abdomen and radiate around the belly button.  Vomiting about 3 times a day.  Tolerating some water.  Describes having loose stools.  No blood in stool or emesis.  The zofran relieves the nausea and abdominal pain for about 20 minutes.  Also complaining of submandibular pain, sore throat, fatigue, cough and shortness of breath.  No headache, neck stiffness, urinary symptoms.    Past Medical History  Diagnosis Date  . Allergic angiitis   . Multiple allergies   . Sickle cell trait     History reviewed. No pertinent past surgical history.  History reviewed. No pertinent family history.  History  Substance Use Topics  . Smoking status: Never Smoker   . Smokeless tobacco: Not on file  . Alcohol Use: No      Review of Systems  Constitutional: Positive for chills and fatigue. Negative for fever.  HENT: Positive for sore throat. Negative for congestion and rhinorrhea.   Eyes: Negative.   Respiratory: Positive for cough and shortness of breath.   Cardiovascular: Negative for chest pain, palpitations and leg swelling.  Gastrointestinal: Positive for nausea, vomiting, abdominal pain and diarrhea (loose stools). Negative for  constipation and blood in stool.  Genitourinary: Negative.   Musculoskeletal: Negative.   Skin: Negative.   Neurological: Negative.   All other systems reviewed and are negative.    Allergies  Fish allergy  Home Medications   Current Outpatient Rx  Name  Route  Sig  Dispense  Refill  . HYDROcodone-acetaminophen (NORCO) 5-325 MG per tablet   Oral   Take 2 tablets by mouth every 4 (four) hours as needed for pain.   20 tablet   0   . levofloxacin (LEVAQUIN) 750 MG tablet   Oral   Take 1 tablet (750 mg total) by mouth daily. X 7 days   7 tablet   0   . ondansetron (ZOFRAN ODT) 4 MG disintegrating tablet      4mg  ODT q4 hours prn nausea/vomit   8 tablet   0     BP 136/72  Pulse 86  Temp(Src) 98 F (36.7 C) (Oral)  Resp 12  SpO2 100%  Physical Exam  Constitutional: He is oriented to person, place, and time. He appears well-developed and well-nourished. No distress.  HENT:  Head: Normocephalic and atraumatic.  Right Ear: External ear normal.  Left Ear: External ear normal.  Mouth/Throat: Oropharynx is clear and moist. No oropharyngeal exudate.  Eyes: Conjunctivae and EOM are normal. Pupils are equal, round, and reactive to light. Right eye exhibits no discharge. Left eye exhibits no discharge.  Neck: Normal range of motion. Neck supple.  Tender over bilateral submandibular lymph nodes without obvious enlargement  Cardiovascular: Normal rate, regular rhythm, normal heart sounds and intact distal pulses.   No murmur heard. Pulmonary/Chest: Effort normal and breath sounds normal. No respiratory distress. He has no wheezes. He has no rales.  Abdominal: Soft. Bowel sounds are normal. He exhibits no distension and no mass. There is tenderness (diffuse, but worse along right side). There is no rebound and no guarding.  Musculoskeletal: He exhibits no edema and no tenderness.  Lymphadenopathy:    He has no cervical adenopathy.  Neurological: He is alert and oriented to  person, place, and time. He exhibits normal muscle tone. Coordination normal.  Skin: Skin is warm and dry. No rash noted. He is not diaphoretic. No erythema.  Psychiatric: He has a normal mood and affect. Thought content normal.    ED Course  Procedures (including critical care time)  Labs Reviewed  CBC WITH DIFFERENTIAL - Abnormal; Notable for the following:    MCHC 36.3 (*)    Neutrophils Relative 33 (*)    Lymphocytes Relative 50 (*)    Eosinophils Relative 6 (*)    All other components within normal limits  COMPREHENSIVE METABOLIC PANEL   No results found.   No diagnosis found.    MDM  22 yo healthy M presenting with continued abdominal pain and nausea/vomiting.  CT abd/pelvic normal 4 days ago.  Concern for IBD given prolonged course of functional abdominal pain/nausea.  Unknown family history.  Will check basic lab work, phenergan, PO challenge.  Anticipate d/c with anti-emetic, bentyl, and f/u with gastroenterology.     8:30AM: Labs are normal.  Nausea has improved with phenergan, but not completely.  Will give reglan as well and do fluid challenge.   9:30AM: Patient feeling better after bentyl and reglan.  Tolerating POs.  Okay for discharge.  Encouraged f/u with GI.  Return precautions reviewed.        Phebe Colla, MD 01/24/13 [phone]

## 2013-01-24 NOTE — ED Provider Notes (Signed)
I saw and evaluated the patient, reviewed the resident's note and I agree with the findings and plan.  The patient is well-appearing.  His abdominal exam is benign.  This may represent some type of inflammatory bowel disease given his age and his symptoms.  CT scan reviewed from several days ago that was normal.  The patient feels better in emergency apartment.  Discharge home with antibiotics and gastroenterology followup.  He understands to return to the ER for new or worsening symptoms  Lyanne Co, MD 01/24/13 1000

## 2013-01-24 NOTE — ED Notes (Signed)
Offered patient fluids, patient tolerated well.  No emesis noted.

## 2013-01-24 NOTE — ED Notes (Signed)
Pt from home c/o n/v w/abd pain and loose stools. Pt was seen here for same Thursday. Pt states symptoms have not resolved.

## 2013-07-03 ENCOUNTER — Encounter (HOSPITAL_COMMUNITY): Payer: Self-pay | Admitting: Emergency Medicine

## 2013-07-03 ENCOUNTER — Emergency Department (HOSPITAL_COMMUNITY): Payer: Self-pay

## 2013-07-03 ENCOUNTER — Emergency Department (HOSPITAL_COMMUNITY)
Admission: EM | Admit: 2013-07-03 | Discharge: 2013-07-03 | Disposition: A | Discharge status: Home / Self Care | Payer: Self-pay | Attending: Emergency Medicine | Admitting: Emergency Medicine

## 2013-07-03 DIAGNOSIS — R112 Nausea with vomiting, unspecified: Secondary | ICD-10-CM | POA: Insufficient documentation

## 2013-07-03 DIAGNOSIS — Z79899 Other long term (current) drug therapy: Secondary | ICD-10-CM | POA: Insufficient documentation

## 2013-07-03 DIAGNOSIS — D573 Sickle-cell trait: Secondary | ICD-10-CM | POA: Insufficient documentation

## 2013-07-03 DIAGNOSIS — Z9109 Other allergy status, other than to drugs and biological substances: Secondary | ICD-10-CM | POA: Insufficient documentation

## 2013-07-03 DIAGNOSIS — R109 Unspecified abdominal pain: Secondary | ICD-10-CM | POA: Insufficient documentation

## 2013-07-03 DIAGNOSIS — R1110 Vomiting, unspecified: Secondary | ICD-10-CM

## 2013-07-03 DIAGNOSIS — R197 Diarrhea, unspecified: Secondary | ICD-10-CM | POA: Insufficient documentation

## 2013-07-03 DIAGNOSIS — M3130 Wegener's granulomatosis without renal involvement: Secondary | ICD-10-CM | POA: Insufficient documentation

## 2013-07-03 LAB — CBC WITH DIFFERENTIAL/PLATELET
Basophils Absolute: 0 10*3/uL (ref 0.0–0.1)
Basophils Relative: 0 % (ref 0–1)
Eosinophils Absolute: 0.2 10*3/uL (ref 0.0–0.7)
Eosinophils Relative: 3 % (ref 0–5)
Lymphocytes Relative: 46 % (ref 12–46)
MCHC: 35.9 g/dL (ref 30.0–36.0)
MCV: 81.5 fL (ref 78.0–100.0)
Platelets: 229 10*3/uL (ref 150–400)
RDW: 13.2 % (ref 11.5–15.5)
WBC: 5.2 10*3/uL (ref 4.0–10.5)

## 2013-07-03 LAB — URINALYSIS, ROUTINE W REFLEX MICROSCOPIC
Bilirubin Urine: NEGATIVE
Ketones, ur: NEGATIVE mg/dL
Nitrite: NEGATIVE
Urobilinogen, UA: 0.2 mg/dL (ref 0.0–1.0)

## 2013-07-03 LAB — LIPASE, BLOOD: Lipase: 22 U/L (ref 11–59)

## 2013-07-03 LAB — COMPREHENSIVE METABOLIC PANEL
ALT: 19 U/L (ref 0–53)
AST: 21 U/L (ref 0–37)
Albumin: 3.8 g/dL (ref 3.5–5.2)
CO2: 26 mEq/L (ref 19–32)
Calcium: 9.2 mg/dL (ref 8.4–10.5)
Sodium: 140 mEq/L (ref 135–145)
Total Protein: 6.9 g/dL (ref 6.0–8.3)

## 2013-07-03 LAB — URINE MICROSCOPIC-ADD ON

## 2013-07-03 MED ORDER — IOHEXOL 300 MG/ML  SOLN
100.0000 mL | Freq: Once | INTRAMUSCULAR | Status: AC | PRN
  Administered 2013-07-03: 100 mL via INTRAVENOUS

## 2013-07-03 MED ORDER — ONDANSETRON HCL 4 MG/2ML IJ SOLN
4.0000 mg | Freq: Once | INTRAMUSCULAR | Status: AC
  Administered 2013-07-03: 4 mg via INTRAVENOUS
  Filled 2013-07-03: qty 2

## 2013-07-03 MED ORDER — MORPHINE SULFATE 4 MG/ML IJ SOLN
6.0000 mg | Freq: Once | INTRAMUSCULAR | Status: AC
  Administered 2013-07-03: 6 mg via INTRAVENOUS
  Filled 2013-07-03: qty 2

## 2013-07-03 MED ORDER — POTASSIUM CHLORIDE CRYS ER 20 MEQ PO TBCR
40.0000 meq | EXTENDED_RELEASE_TABLET | Freq: Once | ORAL | Status: AC
  Administered 2013-07-03: 40 meq via ORAL
  Filled 2013-07-03: qty 2

## 2013-07-03 MED ORDER — IOHEXOL 300 MG/ML  SOLN
25.0000 mL | INTRAMUSCULAR | Status: DC | PRN
  Administered 2013-07-03: 25 mL via ORAL

## 2013-07-03 MED ORDER — MORPHINE SULFATE 4 MG/ML IJ SOLN
8.0000 mg | Freq: Once | INTRAMUSCULAR | Status: AC
  Administered 2013-07-03: 8 mg via INTRAVENOUS
  Filled 2013-07-03: qty 2

## 2013-07-03 MED ORDER — ONDANSETRON HCL 8 MG PO TABS: 8.0000 mg | ORAL_TABLET | Freq: Two times a day (BID) | ORAL | Status: DC | PRN

## 2013-07-03 NOTE — ED Notes (Signed)
Pt has been experiencing n/v/d x7 days along with RLQ and LLQ abd pain. Pt reports having fevers of 101.

## 2013-07-03 NOTE — ED Provider Notes (Signed)
CSN: [id number]     Arrival date & time 07/03/13  0439 History   First MD Initiated Contact with Patient 07/03/13 [phone]     Chief Complaint  Patient presents with  . Abdominal Pain   (Consider location/radiation/quality/duration/timing/severity/associated sxs/prior Treatment) HPI Comments: 22 yo male with hx of recurrent abd pain presents with lower abd pain, vomiting, diarrhea for 7 days.  Across lower abdomen.  No sick contacts or new foods.  Nothing improves, gradually worsening.  No abd surgery hx.  Pt has had egd in the past, unremarkable per pt.   Patient is a 22 y.o. male presenting with abdominal pain. The history is provided by the patient.  Abdominal Pain Associated symptoms: diarrhea, nausea and vomiting   Associated symptoms: no chest pain, no chills, no dysuria, no fever and no shortness of breath     Past Medical History  Diagnosis Date  . Allergic angiitis   . Multiple allergies   . Sickle cell trait    History reviewed. No pertinent past surgical history. No family history on file. History  Substance Use Topics  . Smoking status: Never Smoker   . Smokeless tobacco: Not on file  . Alcohol Use: No    Review of Systems  Constitutional: Negative for fever and chills.  HENT: Negative for neck pain and neck stiffness.   Respiratory: Negative for shortness of breath.   Cardiovascular: Negative for chest pain.  Gastrointestinal: Positive for nausea, vomiting, abdominal pain and diarrhea.  Genitourinary: Negative for dysuria and flank pain.  Musculoskeletal: Negative for back pain.  Skin: Negative for rash.  Neurological: Negative for light-headedness and headaches.    Allergies  Fish allergy  Home Medications   Current Outpatient Rx  Name  Route  Sig  Dispense  Refill  . Aspirin-Salicylamide-Caffeine (BC HEADACHE POWDER PO)   Oral   Take 1 packet by mouth 2 (two) times daily as needed (for pain).          BP 136/71  Pulse 60  Temp(Src) 98 F (36.7 C)  (Oral)  Resp 19  SpO2 100% Physical Exam  Nursing note and vitals reviewed. Constitutional: He is oriented to person, place, and time. He appears well-developed and well-nourished.  HENT:  Head: Normocephalic and atraumatic.  Eyes: Conjunctivae are normal. Right eye exhibits no discharge. Left eye exhibits no discharge.  Neck: Normal range of motion. Neck supple. No tracheal deviation present.  Cardiovascular: Normal rate and regular rhythm.   Pulmonary/Chest: Effort normal and breath sounds normal.  Abdominal: Soft. He exhibits no distension. There is tenderness (tender RLQ and LLQ). There is no guarding.  Musculoskeletal: He exhibits no edema.  Neurological: He is alert and oriented to person, place, and time.  Skin: Skin is warm. No rash noted.  Psychiatric: He has a normal mood and affect.    ED Course  Procedures (including critical care time) Labs Review Labs Reviewed  CBC WITH DIFFERENTIAL - Abnormal; Notable for the following:    Neutrophils Relative % 39 (*)    All other components within normal limits  COMPREHENSIVE METABOLIC PANEL - Abnormal; Notable for the following:    Potassium 3.3 (*)    All other components within normal limits  URINALYSIS, ROUTINE W REFLEX MICROSCOPIC - Abnormal; Notable for the following:    Color, Urine STRAW (*)    Specific Gravity, Urine <1.005 (*)    Hgb urine dipstick TRACE (*)    All other components within normal limits  LIPASE, BLOOD  URINE MICROSCOPIC-ADD  ON   Imaging Review No results found.  MDM  No diagnosis found. Likely acute on chronic vs colitis vs atypical appy,.  Pain improved in ED.  With recurrent sxs, fever at home, vomiting, CT to rule out appy. Fluids given.   Signed out with plan to fup CT scan and dispo.     Enid Skeens, MD 07/03/13 [phone]

## 2013-07-03 NOTE — ED Provider Notes (Signed)
I assumed care in signout from Dr Jodi Mourning to f/u on CT imaging CT scan negative, and appendix is normal Pt admits he has pain on/off for "awhile" He walks around the room and department in no distress His abdomen is soft to palpation Discussed need for outpatient followup with patient and his mother Stable for d/c home   Joya Gaskins, MD 07/03/13 [phone]

## 2013-07-03 NOTE — ED Notes (Signed)
Pt. Reports that pain continues to be 10/10.  Pain medication he received was ineffective.  Reported to Dr. Bebe Shaggy

## 2013-08-19 ENCOUNTER — Emergency Department (HOSPITAL_COMMUNITY)
Admission: EM | Admit: 2013-08-19 | Discharge: 2013-08-19 | Disposition: A | Discharge status: Home / Self Care | Payer: Self-pay | Attending: Emergency Medicine | Admitting: Emergency Medicine

## 2013-08-19 ENCOUNTER — Other Ambulatory Visit: Payer: Self-pay | Admitting: Diagnostic Radiology

## 2013-08-19 ENCOUNTER — Encounter (HOSPITAL_COMMUNITY): Payer: Self-pay | Admitting: Emergency Medicine

## 2013-08-19 ENCOUNTER — Emergency Department (HOSPITAL_COMMUNITY): Payer: Self-pay

## 2013-08-19 DIAGNOSIS — R0789 Other chest pain: Secondary | ICD-10-CM

## 2013-08-19 DIAGNOSIS — R071 Chest pain on breathing: Secondary | ICD-10-CM | POA: Insufficient documentation

## 2013-08-19 DIAGNOSIS — Z862 Personal history of diseases of the blood and blood-forming organs and certain disorders involving the immune mechanism: Secondary | ICD-10-CM | POA: Insufficient documentation

## 2013-08-19 LAB — CBC
Hemoglobin: 16.1 g/dL (ref 13.0–17.0)
MCH: 29 pg (ref 26.0–34.0)
MCHC: 35.9 g/dL (ref 30.0–36.0)
MCV: 80.9 fL (ref 78.0–100.0)
Platelets: 252 10*3/uL (ref 150–400)
RBC: 5.55 MIL/uL (ref 4.22–5.81)
WBC: 4.6 10*3/uL (ref 4.0–10.5)

## 2013-08-19 LAB — COMPREHENSIVE METABOLIC PANEL
ALT: 18 U/L (ref 0–53)
AST: 21 U/L (ref 0–37)
Alkaline Phosphatase: 60 U/L (ref 39–117)
CO2: 27 mEq/L (ref 19–32)
Calcium: 10 mg/dL (ref 8.4–10.5)
Chloride: 99 mEq/L (ref 96–112)
GFR calc Af Amer: >90 mL/min (ref >90)
GFR calc non Af Amer: >90 mL/min (ref >90)
Glucose, Bld: 91 mg/dL (ref 70–99)
Potassium: 3.3 mEq/L — ABNORMAL LOW (ref 3.5–5.1)
Sodium: 137 mEq/L (ref 135–145)
Total Bilirubin: 1.1 mg/dL (ref 0.3–1.2)
Total Protein: 8.6 g/dL — ABNORMAL HIGH (ref 6.0–8.3)

## 2013-08-19 MED ORDER — HYDROMORPHONE HCL PF 1 MG/ML IJ SOLN
0.5000 mg | Freq: Once | INTRAMUSCULAR | Status: AC
  Administered 2013-08-19: 0.5 mg via INTRAMUSCULAR
  Filled 2013-08-19: qty 1

## 2013-08-19 MED ORDER — IBUPROFEN 600 MG PO TABS: 600.0000 mg | ORAL_TABLET | Freq: Three times a day (TID) | ORAL | Status: AC

## 2013-08-19 MED ORDER — KETOROLAC TROMETHAMINE 30 MG/ML IJ SOLN: 30.0000 mg | Freq: Once | INTRAMUSCULAR | Status: DC

## 2013-08-19 MED ORDER — KETOROLAC TROMETHAMINE 30 MG/ML IJ SOLN
30.0000 mg | Freq: Once | INTRAMUSCULAR | Status: AC
  Administered 2013-08-19: 30 mg via INTRAMUSCULAR
  Filled 2013-08-19: qty 1

## 2013-08-19 NOTE — ED Notes (Signed)
Pt was laying on couch and began feeling as if someone was stabbing him in middle of chest and he felt as if his heart was racing.  He became anxious as if was falling over a cliff.  Pt feels as if he has been under a lot of stress.

## 2013-08-19 NOTE — ED Notes (Signed)
Patient transported to X-ray, NAD.

## 2013-08-19 NOTE — ED Provider Notes (Signed)
CSN: [id number]     Arrival date & time 08/19/13  1857 History   First MD Initiated Contact with Patient 08/19/13 1927     Chief Complaint  Patient presents with  . Chest Pain   (Consider location/radiation/quality/duration/timing/severity/associated sxs/prior Treatment) HPI Patient presents with chest pain.  Pain began earlier today, when the patient was sitting down. The past several days he has had vague discomfort, palpitations in the area, but earlier today the discomfort became sharp, focally about the left suprasternal area.  There is no associated nausea, vomiting, lightheadedness, syncope, unilateral weakness. Pain is nonradiating. Patient does not smoke, does not drink. Patient has no notable family history of cardiac disease. Patient is a Archivist, and working as well. No clear alleviating or exacerbating factors Past Medical History  Diagnosis Date  . Allergic angiitis   . Multiple allergies   . Sickle cell trait    History reviewed. No pertinent past surgical history. No family history on file. History  Substance Use Topics  . Smoking status: Never Smoker   . Smokeless tobacco: Not on file  . Alcohol Use: No    Review of Systems  Constitutional:       Per HPI, otherwise negative  HENT:       Per HPI, otherwise negative  Respiratory:       Per HPI, otherwise negative  Cardiovascular:       Per HPI, otherwise negative  Gastrointestinal: Negative for vomiting.  Endocrine:       Negative aside from HPI  Genitourinary:       Neg aside from HPI   Musculoskeletal:       Per HPI, otherwise negative  Skin: Negative for rash and wound.  Neurological: Negative for syncope.    Allergies  Fish allergy  Home Medications  No current outpatient prescriptions on file. BP 135/63  Pulse 65  Temp(Src) 98.5 F (36.9 C) (Oral)  Resp 16  Ht 5\' 7"  (1.702 m)  Wt 190 lb 8 oz (86.41 kg)  BMI 29.83 kg/m2  SpO2 100% Physical Exam  Nursing note and vitals  reviewed. Constitutional: He is oriented to person, place, and time. He appears well-developed. No distress.  HENT:  Head: Normocephalic and atraumatic.  Eyes: Conjunctivae and EOM are normal.  Cardiovascular: Normal rate and regular rhythm.   Pulmonary/Chest: Effort normal. No stridor. No respiratory distress.    Abdominal: He exhibits no distension.  Musculoskeletal: He exhibits no edema.  Neurological: He is alert and oriented to person, place, and time.  Skin: Skin is warm and dry.  Psychiatric: He has a normal mood and affect.    ED Course  Procedures (including critical care time) Labs Review Labs Reviewed  COMPREHENSIVE METABOLIC PANEL - Abnormal; Notable for the following:    Potassium 3.3 (*)    Total Protein 8.6 (*)    All other components within normal limits  CBC  POCT I-STAT TROPONIN I   Imaging Review Dg Chest 2 View  08/19/2013   CLINICAL DATA:  Chest pain,  rapid heart rate  EXAM: CHEST  2 VIEW  COMPARISON:  None.  FINDINGS: Normal mediastinum and cardiac silhouette. Normal pulmonary vasculature. No evidence of effusion, infiltrate, or pneumothorax. No acute bony abnormality.  IMPRESSION: Normal chest radiograph.   Electronically Signed   By: Genevive Bi M.D.   On: 08/19/2013 19:40    EKG Interpretation     Ventricular Rate:  82 PR Interval:  146 QRS Duration: 88 QT Interval:  334  QTC Calculation: 390 R Axis:   81 Text Interpretation:  Normal sinus rhythm with sinus arrhythmia Artifact Borderline ECG           Cardiac 70 sinus rhythm normal Pulse oximetry 100% room air normal   10:44 PM Patient upright, talking on the phone.  He appears better, states he feels better. MDM  No diagnosis found. This young male presents with chest pain.  Patient has no risk factors for ACS, a nonischemic EKG, negative troponin.  The tenderness to palpation about the left suprasternal/clavicular area suggests inflammatory condition.  Patient's labs, x-ray  were reassuring.  Patient was started on an anti-inflammatory regimen for discharged in stable condition.    Gerhard Munch, MD 08/19/13 [phone]

## 2013-09-09 ENCOUNTER — Emergency Department (HOSPITAL_COMMUNITY): Payer: Self-pay

## 2013-09-09 ENCOUNTER — Emergency Department (HOSPITAL_COMMUNITY)
Admission: EM | Admit: 2013-09-09 | Discharge: 2013-09-09 | Disposition: A | Discharge status: Home / Self Care | Payer: Self-pay | Attending: Emergency Medicine | Admitting: Emergency Medicine

## 2013-09-09 ENCOUNTER — Encounter (HOSPITAL_COMMUNITY): Payer: Self-pay | Admitting: Emergency Medicine

## 2013-09-09 DIAGNOSIS — Z8679 Personal history of other diseases of the circulatory system: Secondary | ICD-10-CM | POA: Insufficient documentation

## 2013-09-09 DIAGNOSIS — H53149 Visual discomfort, unspecified: Secondary | ICD-10-CM | POA: Insufficient documentation

## 2013-09-09 DIAGNOSIS — R51 Headache: Secondary | ICD-10-CM | POA: Insufficient documentation

## 2013-09-09 DIAGNOSIS — H538 Other visual disturbances: Secondary | ICD-10-CM | POA: Insufficient documentation

## 2013-09-09 DIAGNOSIS — R110 Nausea: Secondary | ICD-10-CM | POA: Insufficient documentation

## 2013-09-09 DIAGNOSIS — Z862 Personal history of diseases of the blood and blood-forming organs and certain disorders involving the immune mechanism: Secondary | ICD-10-CM | POA: Insufficient documentation

## 2013-09-09 MED ORDER — MORPHINE SULFATE 4 MG/ML IJ SOLN
4.0000 mg | Freq: Once | INTRAMUSCULAR | Status: AC
  Administered 2013-09-09: 4 mg via INTRAVENOUS
  Filled 2013-09-09: qty 1

## 2013-09-09 MED ORDER — KETOROLAC TROMETHAMINE 30 MG/ML IJ SOLN
30.0000 mg | Freq: Once | INTRAMUSCULAR | Status: AC
  Administered 2013-09-09: 30 mg via INTRAVENOUS
  Filled 2013-09-09: qty 1

## 2013-09-09 MED ORDER — METOCLOPRAMIDE HCL 5 MG/ML IJ SOLN
10.0000 mg | Freq: Once | INTRAMUSCULAR | Status: AC
  Administered 2013-09-09: 10 mg via INTRAVENOUS
  Filled 2013-09-09: qty 2

## 2013-09-09 NOTE — ED Notes (Signed)
Pt presents with c/o headache that has been going on for about an hour. Pt says he has never had a headache like this before. Pt says the pain is "rippling" from the left side of his head to the right side and his eyes are also throbbing.

## 2013-09-09 NOTE — ED Provider Notes (Signed)
CSN: [id number]     Arrival date & time 09/09/13  1906 History   First MD Initiated Contact with Patient 09/09/13 2008     Chief Complaint  Patient presents with  . Headache    HPI Is reports worsening headache for the past several days with associated nausea.  He reports bilateral blurred vision.  He reports some photophobia and phonophobia.  No prior history of headache.  Recent trauma or illness.  Neck pain or neck stiffness.  No vomiting.  No diarrhea.  No recent illness or sick contacts.  Denies weakness of his arms or legs.  Symptoms are moderate to severe in severity.  Nothing improves his pain.  His pain is worsened as above   Past Medical History  Diagnosis Date  . Allergic angiitis   . Multiple allergies   . Sickle cell trait    History reviewed. No pertinent past surgical history. No family history on file. History  Substance Use Topics  . Smoking status: Never Smoker   . Smokeless tobacco: Not on file  . Alcohol Use: No    Review of Systems  All other systems reviewed and are negative.    Allergies  Fish allergy  Home Medications   Current Outpatient Rx  Name  Route  Sig  Dispense  Refill  . ibuprofen (ADVIL,MOTRIN) 200 MG tablet   Oral   Take 200 mg by mouth every 6 (six) hours as needed (pain).          BP 131/84  Pulse 72  Temp(Src) 98.5 F (36.9 C) (Oral)  Resp 18  SpO2 98% Physical Exam  Nursing note and vitals reviewed. Constitutional: He is oriented to person, place, and time. He appears well-developed and well-nourished.  HENT:  Head: Normocephalic and atraumatic.  Eyes: EOM are normal. Pupils are equal, round, and reactive to light.  Neck: Normal range of motion.  Cardiovascular: Normal rate, regular rhythm, normal heart sounds and intact distal pulses.   Pulmonary/Chest: Effort normal and breath sounds normal. No respiratory distress.  Abdominal: Soft. He exhibits no distension. There is no tenderness.  Musculoskeletal: Normal range  of motion.  Neurological: He is alert and oriented to person, place, and time.  5/5 strength in major muscle groups of  bilateral upper and lower extremities. Speech normal. No facial asymetry.   Skin: Skin is warm and dry.  Psychiatric: He has a normal mood and affect. Judgment normal.    ED Course  Procedures (including critical care time) Labs Review Labs Reviewed - No data to display Imaging Review Ct Head Wo Contrast  09/09/2013   CLINICAL DATA:  Syncopal episode today. Headache for several days. Difficulty swallowing and talking.  EXAM: CT HEAD WITHOUT CONTRAST  TECHNIQUE: Contiguous axial images were obtained from the base of the skull through the vertex without intravenous contrast.  COMPARISON:  09/05/2005.  FINDINGS: Normal appearing cerebral hemispheres and posterior fossa structures. Normal size and position of the ventricles. No intracranial hemorrhage, mass lesion or CT evidence of acute infarction. Unremarkable bones and included paranasal sinuses.  IMPRESSION: Normal examination.   Electronically Signed   By: Gordan Payment M.D.   On: 09/09/2013 21:15  I personally reviewed the imaging tests through PACS system I reviewed available ER/hospitalization records through the EMR   EKG Interpretation   None       MDM   1. Headache    May represent migraine headache.  Outpatient neurology followup.  CT head normal.  Discharge home in good  condition.  Patient feeling better after migraine cocktail    Lyanne Co, MD 09/09/13 2212

## 2013-12-22 ENCOUNTER — Encounter (HOSPITAL_COMMUNITY): Payer: Self-pay | Admitting: Emergency Medicine

## 2013-12-22 ENCOUNTER — Emergency Department (HOSPITAL_COMMUNITY)
Admission: EM | Admit: 2013-12-22 | Discharge: 2013-12-23 | Disposition: A | Discharge status: Home / Self Care | Payer: Self-pay | Attending: Emergency Medicine | Admitting: Emergency Medicine

## 2013-12-22 DIAGNOSIS — R51 Headache: Secondary | ICD-10-CM | POA: Insufficient documentation

## 2013-12-22 DIAGNOSIS — L02219 Cutaneous abscess of trunk, unspecified: Secondary | ICD-10-CM | POA: Insufficient documentation

## 2013-12-22 DIAGNOSIS — Z862 Personal history of diseases of the blood and blood-forming organs and certain disorders involving the immune mechanism: Secondary | ICD-10-CM | POA: Insufficient documentation

## 2013-12-22 DIAGNOSIS — R319 Hematuria, unspecified: Secondary | ICD-10-CM | POA: Insufficient documentation

## 2013-12-22 DIAGNOSIS — L03319 Cellulitis of trunk, unspecified: Secondary | ICD-10-CM

## 2013-12-22 DIAGNOSIS — L02214 Cutaneous abscess of groin: Secondary | ICD-10-CM

## 2013-12-22 DIAGNOSIS — R42 Dizziness and giddiness: Secondary | ICD-10-CM | POA: Insufficient documentation

## 2013-12-22 LAB — I-STAT CHEM 8, ED
BUN: 8 mg/dL (ref 6–23)
CREATININE: 1.1 mg/dL (ref 0.50–1.35)
Calcium, Ion: 1.22 mmol/L (ref 1.12–1.23)
Chloride: 101 mEq/L (ref 96–112)
Glucose, Bld: 121 mg/dL — ABNORMAL HIGH (ref 70–99)
HCT: 47 % (ref 39.0–52.0)
HEMOGLOBIN: 16 g/dL (ref 13.0–17.0)
Potassium: 3.7 mEq/L (ref 3.7–5.3)
SODIUM: 141 meq/L (ref 137–147)
TCO2: 27 mmol/L (ref 0–100)

## 2013-12-22 LAB — CBC WITH DIFFERENTIAL/PLATELET
Basophils Absolute: 0 10*3/uL (ref 0.0–0.1)
Basophils Relative: 0 % (ref 0–1)
Eosinophils Absolute: 0.2 10*3/uL (ref 0.0–0.7)
Eosinophils Relative: 2 % (ref 0–5)
HCT: 42.6 % (ref 39.0–52.0)
Hemoglobin: 15 g/dL (ref 13.0–17.0)
LYMPHS ABS: 2.2 10*3/uL (ref 0.7–4.0)
LYMPHS PCT: 29 % (ref 12–46)
MCH: 28.5 pg (ref 26.0–34.0)
MCHC: 35.2 g/dL (ref 30.0–36.0)
MCV: 80.8 fL (ref 78.0–100.0)
Monocytes Absolute: 0.8 10*3/uL (ref 0.1–1.0)
Monocytes Relative: 11 % (ref 3–12)
NEUTROS PCT: 57 % (ref 43–77)
Neutro Abs: 4.3 10*3/uL (ref 1.7–7.7)
PLATELETS: 239 10*3/uL (ref 150–400)
RBC: 5.27 MIL/uL (ref 4.22–5.81)
RDW: 12.8 % (ref 11.5–15.5)
WBC: 7.5 10*3/uL (ref 4.0–10.5)

## 2013-12-22 LAB — URINE MICROSCOPIC-ADD ON

## 2013-12-22 LAB — URINALYSIS, ROUTINE W REFLEX MICROSCOPIC
Bilirubin Urine: NEGATIVE
GLUCOSE, UA: NEGATIVE mg/dL
Ketones, ur: NEGATIVE mg/dL
Leukocytes, UA: NEGATIVE
Nitrite: NEGATIVE
Protein, ur: NEGATIVE mg/dL
Specific Gravity, Urine: 1.009 (ref 1.005–1.030)
Urobilinogen, UA: 0.2 mg/dL (ref 0.0–1.0)
pH: 6.5 (ref 5.0–8.0)

## 2013-12-22 LAB — POC OCCULT BLOOD, ED: Fecal Occult Bld: NEGATIVE

## 2013-12-22 MED ORDER — MORPHINE SULFATE 4 MG/ML IJ SOLN
4.0000 mg | Freq: Once | INTRAMUSCULAR | Status: AC
  Administered 2013-12-22: 4 mg via INTRAVENOUS
  Filled 2013-12-22: qty 1

## 2013-12-22 MED ORDER — SODIUM CHLORIDE 0.9 % IV BOLUS (SEPSIS)
1000.0000 mL | Freq: Once | INTRAVENOUS | Status: AC
  Administered 2013-12-22: 1000 mL via INTRAVENOUS

## 2013-12-22 MED ORDER — ONDANSETRON HCL 4 MG/2ML IJ SOLN
4.0000 mg | Freq: Once | INTRAMUSCULAR | Status: AC
  Administered 2013-12-22: 4 mg via INTRAVENOUS
  Filled 2013-12-22: qty 2

## 2013-12-22 MED ORDER — HYDROMORPHONE HCL PF 1 MG/ML IJ SOLN
1.0000 mg | Freq: Once | INTRAMUSCULAR | Status: AC
  Administered 2013-12-22: 1 mg via INTRAVENOUS
  Filled 2013-12-22: qty 1

## 2013-12-22 NOTE — ED Notes (Signed)
Pt complains of urinating blood and spitting up blood since Monday, he also states that he has pain in his right lower flank. Pt has a groin cyst on the right side

## 2013-12-22 NOTE — ED Notes (Signed)
PA at bedside. PA Laveda Normanran at bedside performing I&D on pt's abscess.

## 2013-12-22 NOTE — ED Notes (Addendum)
Pt is aware that we need urine. A urinal has been left at the bedside.

## 2013-12-22 NOTE — ED Provider Notes (Signed)
CSN: [id number]     Arrival date & time 12/22/13  1834 History   First MD Initiated Contact with Patient 12/22/13 1945     Chief Complaint  Patient presents with  . Hematuria  . Groin Pain     (Consider location/radiation/quality/duration/timing/severity/associated sxs/prior Treatment) HPI  23 year old male with history of sickle cell trait presents with chief complaints of abnormal bleeding. Patient reports for the past 2-3 weeks he has been experiencing many interesting symptoms including headache. Describes headache as a throbbing sensation to his forehead followed by a pulsating sensation lasting for minutes to hours. Reports light and sound sensitivity with headaches. He tries taking Motrin twice daily with minimal relief. He also endorsed having occasional cough with trace of blood as well as vomiting a small amount of blood for the past 3 weeks. For the same duration he complains of urinating blood as well. Report having right lower flank and noticing a cyst on his R groin that is painful.  Patient also reported having lightheadedness and dizziness with occasional shortness of breath. Denies any family history of coagulopathy. Denies fever chills. Denies any abnormal gum bleeding, any prior history of PE DVT, patient is not a smoker.  Past Medical History  Diagnosis Date  . Allergic angiitis   . Multiple allergies   . Sickle cell trait    History reviewed. No pertinent past surgical history. History reviewed. No pertinent family history. History  Substance Use Topics  . Smoking status: Never Smoker   . Smokeless tobacco: Not on file  . Alcohol Use: No    Review of Systems  All other systems reviewed and are negative.      Allergies  Tramadol and Fish allergy  Home Medications   Current Outpatient Rx  Name  Route  Sig  Dispense  Refill  . Diphenhydramine-APAP, sleep, 38-500 MG PACK   Oral   Take 1 Package by mouth 2 (two) times daily as needed (HEADACHE).           BP 132/69  Pulse 78  Temp(Src) 98.7 F (37.1 C) (Oral)  Resp 18  Ht 5\' 7"  (1.702 m)  Wt 181 lb (82.101 kg)  BMI 28.34 kg/m2  SpO2 100% Physical Exam  Nursing note and vitals reviewed. Constitutional: He is oriented to person, place, and time. He appears well-developed and well-nourished. No distress.  Awake, alert, nontoxic appearance  HENT:  Head: Atraumatic.  Mouth/Throat: Oropharynx is clear and moist. No oropharyngeal exudate.  Eyes: Conjunctivae and EOM are normal. Pupils are equal, round, and reactive to light. Right eye exhibits no discharge. Left eye exhibits no discharge.  Neck: Normal range of motion. Neck supple.  No nuchal rigidity  Cardiovascular: Normal rate and regular rhythm.   Pulmonary/Chest: Effort normal. No respiratory distress. He exhibits no tenderness.  Abdominal: Soft. Bowel sounds are normal. There is tenderness (mild generalized abdominal tenderness without guarding or rebound tenderness. no hernia noted). There is no rebound.  R CVA tenderness.    Genitourinary:  Chaperone present:  Normal circumcised penis, no scrotal pain or swelling, no hernia noted.    Superficial cutaneous abscess noted to R groin, ttp.    Normal rectal tone, no frank bleeding, no melena.  Hemoccult negative    Musculoskeletal: He exhibits no tenderness.  ROM appears intact, no obvious focal weakness  Neurological: He is alert and oriented to person, place, and time.  Skin: Skin is warm and dry. No rash noted.  Psychiatric: He has a normal mood and affect.  ED Course  Procedures (including critical care time)  12:00 AM Patient came in complaining of hematuria, hemoptysis, hematemesis. Patient has normal hemoglobin of 15, and fecal Hemoccult is negative. There is trace of blood on urinalysis. Patient does have mild right flank pain. Dr. Jodi MourningZavitz performed bedside renal US without evidence of hydronephrosis.  Pt may have a small kidney stone causing his complaint.  He has  normal platelets and normal renal function.  Therefore, will provide pain medication and urology follow up.  Pt also has a small cutaneous R inguinal abscess that was I&D by me.  Recommend warm compress and abscess care.  Return precaution discussed.  Otherwise pt stable for discharge.  Pt to f/u with PCP for further care  INCISION AND DRAINAGE Performed by: Fayrene HelperRAN,Weston Fulco Consent: Verbal consent obtained. Risks and benefits: risks, benefits and alternatives were discussed Type: abscess  Body area: R inguinal region   Anesthesia: local infiltration  Incision was made with a scalpel.  Local anesthetic: lidocaine 2% w/o epinephrine  Anesthetic total: 3 ml  Complexity: complex Blunt dissection to break up loculations  Drainage: purulent  Drainage amount: small  Packing material: none  Patient tolerance: Patient tolerated the procedure well with no immediate complications.     Labs Review Labs Reviewed  URINALYSIS, ROUTINE W REFLEX MICROSCOPIC - Abnormal; Notable for the following:    Hgb urine dipstick TRACE (*)    All other components within normal limits  I-STAT CHEM 8, ED - Abnormal; Notable for the following:    Glucose, Bld 121 (*)    All other components within normal limits  CBC WITH DIFFERENTIAL  URINE MICROSCOPIC-ADD ON  POC OCCULT BLOOD, ED   Imaging Review No results found.   EKG Interpretation None      MDM   Final diagnoses:  Soft tissue abscess of inguinal region  Hematuria    BP 122/77  Pulse 51  Temp(Src) 98.7 F (37.1 C) (Oral)  Resp 16  Ht 5\' 7"  (1.702 m)  Wt 181 lb (82.101 kg)  BMI 28.34 kg/m2  SpO2 98%     Fayrene HelperBowie Mordecai Tindol, PA-C 12/23/13 0007

## 2013-12-23 MED ORDER — PROMETHAZINE HCL 25 MG PO TABS: 25.0000 mg | ORAL_TABLET | Freq: Four times a day (QID) | ORAL | Status: DC | PRN

## 2013-12-23 MED ORDER — OXYCODONE-ACETAMINOPHEN 5-325 MG PO TABS: 2.0000 | ORAL_TABLET | ORAL | Status: DC | PRN

## 2013-12-23 NOTE — Discharge Instructions (Signed)
Remember to apply warm moist compress to abscess 3-5 times daily for several days and squeeze on it to decrease risk of reinfection.  Take pain medication as needed.  You may have a small kidney stone.  If you continue to have blood in urine and having pain then follow up with urologist.  Otherwise follow up with your doctor for further care.    Abscess Care After An abscess (also called a boil or furuncle) is an infected area that contains a collection of pus. Signs and symptoms of an abscess include pain, tenderness, redness, or hardness, or you may feel a moveable soft area under your skin. An abscess can occur anywhere in the body. The infection may spread to surrounding tissues causing cellulitis. A cut (incision) by the surgeon was made over your abscess and the pus was drained out. Gauze may have been packed into the space to provide a drain that will allow the cavity to heal from the inside outwards. The boil may be painful for 5 to 7 days. Most people with a boil do not have high fevers. Your abscess, if seen early, may not have localized, and may not have been lanced. If not, another appointment may be required for this if it does not get better on its own or with medications. HOME CARE INSTRUCTIONS   Only take over-the-counter or prescription medicines for pain, discomfort, or fever as directed by your caregiver.  When you bathe, soak and then remove gauze or iodoform packs at least daily or as directed by your caregiver. You may then wash the wound gently with mild soapy water. Repack with gauze or do as your caregiver directs. SEEK IMMEDIATE MEDICAL CARE IF:   You develop increased pain, swelling, redness, drainage, or bleeding in the wound site.  You develop signs of generalized infection including muscle aches, chills, fever, or a general ill feeling.  An oral temperature above 102 F (38.9 C) develops, not controlled by medication. See your caregiver for a recheck if you develop any  of the symptoms described above. If medications (antibiotics) were prescribed, take them as directed. Document Released: 04/10/2005 Document Revised: 12/15/2011 Document Reviewed: 12/06/2007 [facility] Patient Information 2014 [place], [place].  Hematuria, Adult Hematuria is blood in your urine. It can be caused by a bladder infection, kidney infection, prostate infection, kidney stone, or cancer of your urinary tract. Infections can usually be treated with medicine, and a kidney stone usually will pass through your urine. If neither of these is the cause of your hematuria, further workup to find out the reason may be needed. It is very important that you tell your health care provider about any blood you see in your urine, even if the blood stops without treatment or happens without causing pain. Blood in your urine that happens and then stops and then happens again can be a symptom of a very serious condition. Also, pain is not a symptom in the initial stages of many urinary cancers. HOME CARE INSTRUCTIONS   Drink lots of fluid, 3 4 quarts a day. If you have been diagnosed with an infection, cranberry juice is especially recommended, in addition to large amounts of water.  Avoid caffeine, tea, and carbonated beverages, because they tend to irritate the bladder.  Avoid alcohol because it may irritate the prostate.  Only take over-the-counter or prescription medicines for pain, discomfort, or fever as directed by your health care provider.  If you have been diagnosed with a kidney stone, follow your health care  provider's instructions regarding straining your urine to catch the stone.  Empty your bladder often. Avoid holding urine for long periods of time.  After a bowel movement, women should cleanse front to back. Use each tissue only once.  Empty your bladder before and after sexual intercourse if you are a male. SEEK MEDICAL CARE IF: You develop back pain, fever, a feeling of sickness in  your stomach (nausea), or vomiting or if your symptoms are not better in 3 days. Return sooner if you are getting worse. SEEK IMMEDIATE MEDICAL CARE IF:   You have a persistent fever, with a temperature of 101.53F (38.8C) or greater.  You develop severe vomiting and are unable to keep the medicine down.  You develop severe back or abdominal pain despite taking your medicines.  You begin passing a large amount of blood or clots in your urine.  You feel extremely weak or faint, or you pass out. MAKE SURE YOU:   Understand these instructions.  Will watch your condition.  Will get help right away if you are not doing well or get worse. Document Released: 09/22/2005 Document Revised: 07/13/2013 Document Reviewed: 05/23/2013 [facility] Patient Information 2014 [place]

## 2013-12-23 NOTE — ED Provider Notes (Signed)
Medical screening examination/treatment/procedure(s) were conducted as a shared visit with non-physician practitioner(s) or resident and myself. I personally evaluated the patient during the encounter and agree with the findings and plan unless otherwise indicated.  I have personally reviewed any xrays and/ or EKG's with the provider and I agree with interpretation.  Sickle trait hx. Intermittent mild bleeding with urinating and vomiting. Recent right flank pain, intermittent. No stone hx. Swelling right groin. No MRSA hx or DM. Exam right inguinal region 2 cm abscess/ tender, no surrounding induration or cellulitis. No flank pain, abd soft/ NT. Blood work unremarkable. Mild hematuria.  Bedside US for flank pain/ hematuria, no hydronephrosis seen.  RIght groin US showed abscess/ improved after I and D.  I was present for I and D and participated in US. Pain meds given.  EMERGENCY DEPARTMENT US SOFT TISSUE INTERPRETATION  "Study: Limited Soft Tissue Ultrasound"  INDICATIONS: Pain and Soft tissue infection  Multiple views of the body part were obtained in real-time with a multi-frequency linear probe  PERFORMED BY: Myself  IMAGES ARCHIVED?: Yes  SIDE:Right  BODY PART:Other soft tisse (comment in note)  FINDINGS: Abcess present  INTERPRETATION: Abcess present and No cellulitis noted  Emergency Focused Ultrasound Exam  Limited retroperitoneal ultrasound of kidneys  Performed and interpreted by Dr. Jodi MourningZavitz  Indication: flank pain right  Focused abdominal ultrasound with both kidneys imaged in transverse and longitudinal planes in real-time.  Interpretation: no hydronephrosis visualized. No stones or cysts visualized  Images archived electronically  Right flank pain, hematuria, Inguinal abscess   Larry SkeensJoshua M Evelene Roussin, MD 12/23/13 1050

## 2014-05-24 ENCOUNTER — Encounter (HOSPITAL_COMMUNITY): Payer: Self-pay | Admitting: Emergency Medicine

## 2014-05-24 ENCOUNTER — Emergency Department (HOSPITAL_COMMUNITY)
Admission: EM | Admit: 2014-05-24 | Discharge: 2014-05-25 | Disposition: A | Discharge status: Home / Self Care | Payer: Self-pay | Attending: Emergency Medicine | Admitting: Emergency Medicine

## 2014-05-24 DIAGNOSIS — R519 Headache, unspecified: Secondary | ICD-10-CM

## 2014-05-24 DIAGNOSIS — Z8709 Personal history of other diseases of the respiratory system: Secondary | ICD-10-CM | POA: Insufficient documentation

## 2014-05-24 DIAGNOSIS — Z862 Personal history of diseases of the blood and blood-forming organs and certain disorders involving the immune mechanism: Secondary | ICD-10-CM | POA: Insufficient documentation

## 2014-05-24 DIAGNOSIS — R51 Headache: Secondary | ICD-10-CM | POA: Insufficient documentation

## 2014-05-24 MED ORDER — DIPHENHYDRAMINE HCL 50 MG/ML IJ SOLN
25.0000 mg | Freq: Once | INTRAMUSCULAR | Status: AC
  Administered 2014-05-24: 25 mg via INTRAVENOUS
  Filled 2014-05-24: qty 1

## 2014-05-24 MED ORDER — SODIUM CHLORIDE 0.9 % IV BOLUS (SEPSIS)
1000.0000 mL | Freq: Once | INTRAVENOUS | Status: AC
  Administered 2014-05-24: 1000 mL via INTRAVENOUS

## 2014-05-24 MED ORDER — KETOROLAC TROMETHAMINE 15 MG/ML IJ SOLN
15.0000 mg | Freq: Once | INTRAMUSCULAR | Status: AC
  Administered 2014-05-24: 15 mg via INTRAVENOUS
  Filled 2014-05-24: qty 1

## 2014-05-24 MED ORDER — PROCHLORPERAZINE EDISYLATE 5 MG/ML IJ SOLN
10.0000 mg | Freq: Four times a day (QID) | INTRAMUSCULAR | Status: DC | PRN
  Administered 2014-05-24: 10 mg via INTRAVENOUS
  Filled 2014-05-24: qty 2

## 2014-05-24 NOTE — Discharge Instructions (Signed)

## 2014-05-24 NOTE — ED Notes (Signed)
Pt with sharp needle like pain in head.  Pain moves around head and in no specific spot.  Pt states dizziness and light headed. Also.

## 2014-05-31 NOTE — ED Provider Notes (Signed)
CSN: [id number]     Arrival date & time 05/24/14  1826 History   First MD Initiated Contact with Patient 05/24/14 2123     Chief Complaint  Patient presents with  . Headache     (Consider location/radiation/quality/duration/timing/severity/associated sxs/prior Treatment) HPI  23 year old male with headache. Gradual onset approximately 3 days ago. Denies any trauma. Describes the pain is sharp/needlelike. Migratory. Does not feel like it stays in any specific location. Denies any significant neck pain or stiffness. Has been taking over-the-counter medication for symptoms of any significant relief. No change in visual acuity or other ocular complaints. No fevers or chills. Denies any significant headache history. No acute numbness, tingling or loss of strength. No family history of aneurysm that is aware of.  Past Medical History  Diagnosis Date  . Allergic angiitis   . Multiple allergies   . Sickle cell trait    History reviewed. No pertinent past surgical history. History reviewed. No pertinent family history. History  Substance Use Topics  . Smoking status: Never Smoker   . Smokeless tobacco: Not on file  . Alcohol Use: No    Review of Systems  All systems reviewed and negative, other than as noted in HPI.   Allergies  Tramadol and Fish allergy  Home Medications   Prior to Admission medications   Medication Sig Start Date End Date Taking? Authorizing Provider  Diphenhydramine-APAP, sleep, 38-500 MG PACK Take 1 Package by mouth 2 (two) times daily as needed (HEADACHE).   Yes Historical Provider, MD   BP 116/80  Pulse 74  Temp(Src) 98.2 F (36.8 C) (Oral)  Resp 16  SpO2 100% Physical Exam  Nursing note and vitals reviewed. Constitutional: He is oriented to person, place, and time. He appears well-developed and well-nourished. No distress.  Sitting up in bed watching TV. No acute distress.  HENT:  Head: Normocephalic and atraumatic.  Eyes: Conjunctivae are normal.  Right eye exhibits no discharge. Left eye exhibits no discharge.  Neck: Normal range of motion. Neck supple.  No neck rigidity  Cardiovascular: Normal rate, regular rhythm and normal heart sounds.  Exam reveals no gallop and no friction rub.   No murmur heard. Pulmonary/Chest: Effort normal and breath sounds normal. No respiratory distress.  Abdominal: Soft. He exhibits no distension. There is no tenderness.  Musculoskeletal: He exhibits no edema and no tenderness.  Neurological: He is alert and oriented to person, place, and time.  Speech clear. Content appropriate. Cranial nerves are intact. Strength is 5 out of 5 bilateral upper and lower extremities. Good finger nose testing bilaterally. Gait is steady.  Skin: Skin is warm and dry.  Psychiatric: He has a normal mood and affect. His behavior is normal. Thought content normal.    ED Course  Procedures (including critical care time) Labs Review Labs Reviewed - No data to display  Imaging Review No results found.   EKG Interpretation None      MDM   Final diagnoses:  Headache, unspecified headache type    23yM with headache. Suspect primary HA. Consider emergent secondary causes such as bleed, infectious or mass but doubt. There is no history of trauma. Pt has a nonfocal neurological exam. Afebrile and neck supple. No use of blood thinning medication. Consider ocular etiology such as acute angle closure glaucoma but doubt. Pt denies acute change in visual acuity and eye exam unremarkable. Doubt CO poisoning. No contacts with similar symptoms. Doubt venous thrombosis. Doubt carotid or vertebral arteries dissection. Symptoms improved with meds. Feel  that can be safely discharged, but strict return precautions discussed. Outpt fu.     Raeford Razor, MD 05/31/14 2211

## 2014-06-19 ENCOUNTER — Encounter (HOSPITAL_COMMUNITY): Payer: Self-pay | Admitting: Emergency Medicine

## 2014-06-19 ENCOUNTER — Emergency Department (HOSPITAL_COMMUNITY): Payer: Self-pay

## 2014-06-19 ENCOUNTER — Emergency Department (HOSPITAL_COMMUNITY)
Admission: EM | Admit: 2014-06-19 | Discharge: 2014-06-20 | Disposition: A | Discharge status: Home / Self Care | Payer: Self-pay | Attending: Emergency Medicine | Admitting: Emergency Medicine

## 2014-06-19 DIAGNOSIS — H9209 Otalgia, unspecified ear: Secondary | ICD-10-CM | POA: Insufficient documentation

## 2014-06-19 DIAGNOSIS — Z8679 Personal history of other diseases of the circulatory system: Secondary | ICD-10-CM | POA: Insufficient documentation

## 2014-06-19 DIAGNOSIS — Z862 Personal history of diseases of the blood and blood-forming organs and certain disorders involving the immune mechanism: Secondary | ICD-10-CM | POA: Insufficient documentation

## 2014-06-19 DIAGNOSIS — J029 Acute pharyngitis, unspecified: Secondary | ICD-10-CM | POA: Insufficient documentation

## 2014-06-19 DIAGNOSIS — R5381 Other malaise: Secondary | ICD-10-CM | POA: Insufficient documentation

## 2014-06-19 DIAGNOSIS — R51 Headache: Secondary | ICD-10-CM | POA: Insufficient documentation

## 2014-06-19 DIAGNOSIS — R5383 Other fatigue: Secondary | ICD-10-CM

## 2014-06-19 LAB — CBC WITH DIFFERENTIAL/PLATELET
BASOS PCT: 1 % (ref 0–1)
Basophils Absolute: 0 10*3/uL (ref 0.0–0.1)
EOS ABS: 0.3 10*3/uL (ref 0.0–0.7)
Eosinophils Relative: 4 % (ref 0–5)
HCT: 44.3 % (ref 39.0–52.0)
Hemoglobin: 15.1 g/dL (ref 13.0–17.0)
LYMPHS ABS: 1.5 10*3/uL (ref 0.7–4.0)
Lymphocytes Relative: 24 % (ref 12–46)
MCH: 28.3 pg (ref 26.0–34.0)
MCHC: 34.1 g/dL (ref 30.0–36.0)
MCV: 83 fL (ref 78.0–100.0)
Monocytes Absolute: 0.7 10*3/uL (ref 0.1–1.0)
Monocytes Relative: 11 % (ref 3–12)
Neutro Abs: 3.6 10*3/uL (ref 1.7–7.7)
Neutrophils Relative %: 60 % (ref 43–77)
PLATELETS: 246 10*3/uL (ref 150–400)
RBC: 5.34 MIL/uL (ref 4.22–5.81)
RDW: 13.5 % (ref 11.5–15.5)
WBC: 6.1 10*3/uL (ref 4.0–10.5)

## 2014-06-19 LAB — COMPREHENSIVE METABOLIC PANEL
ALT: 28 U/L (ref 0–53)
AST: 29 U/L (ref 0–37)
Albumin: 4.1 g/dL (ref 3.5–5.2)
Alkaline Phosphatase: 55 U/L (ref 39–117)
Anion gap: 12 (ref 5–15)
BUN: 6 mg/dL (ref 6–23)
CALCIUM: 9.7 mg/dL (ref 8.4–10.5)
CO2: 23 meq/L (ref 19–32)
Chloride: 99 mEq/L (ref 96–112)
Creatinine, Ser: 0.97 mg/dL (ref 0.50–1.35)
GFR calc Af Amer: >90 mL/min (ref >90)
Glucose, Bld: 96 mg/dL (ref 70–99)
Potassium: 4.4 mEq/L (ref 3.7–5.3)
Sodium: 134 mEq/L — ABNORMAL LOW (ref 137–147)
Total Bilirubin: 0.8 mg/dL (ref 0.3–1.2)
Total Protein: 7.5 g/dL (ref 6.0–8.3)

## 2014-06-19 LAB — I-STAT TROPONIN, ED: Troponin i, poc: 0 ng/mL (ref 0.00–0.08)

## 2014-06-19 LAB — RAPID STREP SCREEN (MED CTR MEBANE ONLY): STREPTOCOCCUS, GROUP A SCREEN (DIRECT): NEGATIVE

## 2014-06-19 LAB — LIPASE, BLOOD: Lipase: 17 U/L (ref 11–59)

## 2014-06-19 MED ORDER — SODIUM CHLORIDE 0.9 % IV BOLUS (SEPSIS)
1000.0000 mL | Freq: Once | INTRAVENOUS | Status: AC
  Administered 2014-06-19: 1000 mL via INTRAVENOUS

## 2014-06-19 MED ORDER — DEXAMETHASONE SODIUM PHOSPHATE 10 MG/ML IJ SOLN
10.0000 mg | Freq: Once | INTRAMUSCULAR | Status: AC
  Administered 2014-06-19: 10 mg via INTRAVENOUS
  Filled 2014-06-19: qty 1

## 2014-06-19 MED ORDER — KETOROLAC TROMETHAMINE 30 MG/ML IJ SOLN
30.0000 mg | Freq: Once | INTRAMUSCULAR | Status: AC
  Administered 2014-06-19: 30 mg via INTRAVENOUS
  Filled 2014-06-19: qty 1

## 2014-06-19 NOTE — ED Notes (Signed)
Pt aware urine sample is needed 

## 2014-06-19 NOTE — ED Provider Notes (Signed)
MSE was initiated and I personally evaluated the patient and placed orders (if any) at  9:48 PM on June 19, 2014.  Larry Estes is a 23 y.o. male who presents to the Emergency Department complaining of gradual onset severe sore throat that started 3 days ago. Swallowing worsens the pain. States he has associated intermittent throbbing headache that started 3 days ago and became constant earlier today. Reports chills, nausea and constant pulling chest pain that started 3 days ago as well. Unsure of fever. He has not eaten or drank anything due to nausea. States he has had lower abdominal pain and two episodes of emesis yesterday. He had bright red stools this morning. Reports neck stiffness and pain that started earlier today. Pt states family members were recently diagnosed with strep throat. Denies blurred vision, sudden loss of vision, SOB, difficulty breathing, black tarry stools.  PCP is Dr. Jeri Cos  Alert and oriented. GCS 15. Heart rate and rhythm normal. Lungs clear to auscultation to upper and lower lobes bilaterally. Bowel sounds normoactive in all 4 quadrants the abdomen, abdomen soft with tenderness upon palpation to the lower quadrants and suprapubic abdomen. Negative trismus. Uvula midline with symmetrical elevation. Very mild tonsillar adenopathy bilaterally with negative petechiae or exudate noted. Decreased range of motion to the neck. Discomfort upon palpation to the neck.  Patient with multiple complaints-neck pain, subjective fever, abdominal pain. Further imaging and labs are required. Patient moved to main ED for further workup to be performed. Orders have been placed.  The patient appears stable so that the remainder of the MSE may be completed by another provider.  Raymon Mutton, PA-C 06/20/14 [phone]

## 2014-06-19 NOTE — ED Notes (Signed)
Pt A&Ox4. Moving all extremities. C/o neck stiffness and fevers at home recently upon PA assessment. Also c/o chest pain. In NAD. Awaiting MD/PA.

## 2014-06-19 NOTE — ED Notes (Signed)
Pt states that he has close family members with strep throat and PNA. Pt states that he is having a severe sore throat and neck pain with stiffness. Pt also c/o nausea.

## 2014-06-20 MED ORDER — PENICILLIN G BENZATHINE 1200000 UNIT/2ML IM SUSP
1.2000 10*6.[IU] | Freq: Once | INTRAMUSCULAR | Status: AC
  Administered 2014-06-20: 1.2 10*6.[IU] via INTRAMUSCULAR
  Filled 2014-06-20: qty 2

## 2014-06-20 MED ORDER — NAPROXEN 500 MG PO TABS: 500.0000 mg | ORAL_TABLET | Freq: Two times a day (BID) | ORAL | Status: DC

## 2014-06-20 NOTE — ED Provider Notes (Signed)
CSN: [id number]     Arrival date & time 06/19/14  1748 History   First MD Initiated Contact with Patient 06/19/14 2120     Chief Complaint  Patient presents with  . Sore Throat  . Neck Pain     (Consider location/radiation/quality/duration/timing/severity/associated sxs/prior Treatment) HPI Comments: Patient with no significant past medical history presents with complaint of sore throat, soreness and neck, nausea, headache for the past 3-4 days. Of note, several family members were recently diagnosed with strep throat. Patient also states he has been exposed to family members who have pneumonia. Patient has chills but no fever. He is eating and drinking well. He took [place] powder once today without relief. He has myalgias. No vomiting, diarrhea, dysuria. The onset of this condition was acute. The course is constant. Aggravating factors: none. Alleviating factors: none.    Patient is a 23 y.o. male presenting with pharyngitis and neck pain. The history is provided by the patient and a parent.  Sore Throat Associated symptoms include chills, fatigue, headaches, myalgias, nausea, neck pain and a sore throat. Pertinent negatives include no abdominal pain, chest pain, congestion, coughing, fever, rash or vomiting.  Neck Pain Associated symptoms: headaches   Associated symptoms: no chest pain and no fever     Past Medical History  Diagnosis Date  . Allergic angiitis   . Multiple allergies   . Sickle cell trait    History reviewed. No pertinent past surgical history. No family history on file. History  Substance Use Topics  . Smoking status: Never Smoker   . Smokeless tobacco: Never Used  . Alcohol Use: No    Review of Systems  Constitutional: Positive for chills and fatigue. Negative for fever.  HENT: Positive for ear pain and sore throat. Negative for congestion, ear discharge, rhinorrhea and sinus pressure.   Eyes: Negative for redness.  Respiratory: Negative for cough, shortness  of breath and wheezing.   Cardiovascular: Negative for chest pain and leg swelling.  Gastrointestinal: Positive for nausea. Negative for vomiting, abdominal pain and diarrhea.  Genitourinary: Negative for dysuria.  Musculoskeletal: Positive for myalgias and neck pain.  Skin: Negative for rash.  Neurological: Positive for headaches.  Hematological: Negative for adenopathy.   Allergies  Tramadol and Fish allergy  Home Medications   Prior to Admission medications   Medication Sig Start Date End Date Taking? Authorizing Provider  Aspirin-Salicylamide-Caffeine (BC HEADACHE POWDER PO) Take 1 packet by mouth as needed (pain or headache).   Yes Historical Provider, MD   BP 126/80  Pulse 60  Temp(Src) 98.7 F (37.1 C) (Oral)  Resp 20  Wt 180 lb (81.647 kg)  SpO2 99%  Physical Exam  Nursing note and vitals reviewed. Constitutional: He appears well-developed and well-nourished.  HENT:  Head: Normocephalic and atraumatic.  Right Ear: Tympanic membrane, external ear and ear canal normal.  Left Ear: Tympanic membrane, external ear and ear canal normal.  Nose: Nose normal. No mucosal edema or rhinorrhea.  Mouth/Throat: Uvula is midline and mucous membranes are normal. Mucous membranes are not dry. No trismus in the jaw. No uvula swelling. Posterior oropharyngeal erythema present. No oropharyngeal exudate, posterior oropharyngeal edema or tonsillar abscesses.  Eyes: Conjunctivae are normal. Right eye exhibits no discharge. Left eye exhibits no discharge.  Neck: Normal range of motion. Neck supple.  No meningitis.  Cardiovascular: Normal rate, regular rhythm and normal heart sounds.   No murmur heard. Pulmonary/Chest: Effort normal and breath sounds normal. No respiratory distress. He has no wheezes. He  has no rales.  Abdominal: Soft. There is no tenderness.  Musculoskeletal: He exhibits no edema and no tenderness.  Lymphadenopathy:    He has no cervical adenopathy.  Neurological: He is  alert.  Skin: Skin is warm and dry.  Psychiatric: He has a normal mood and affect.    ED Course  Procedures (including critical care time) Labs Review Labs Reviewed  COMPREHENSIVE METABOLIC PANEL - Abnormal; Notable for the following:    Sodium 134 (*)    All other components within normal limits  RAPID STREP SCREEN  CULTURE, GROUP A STREP  LIPASE, BLOOD  CBC WITH DIFFERENTIAL  CBC WITH DIFFERENTIAL  URINALYSIS, ROUTINE W REFLEX MICROSCOPIC  I-STAT TROPOININ, ED    Imaging Review Dg Chest 2 View  06/19/2014   CLINICAL DATA:  Sore throat and neck stiffness  EXAM: CHEST  2 VIEW  COMPARISON:  Prior chest x-ray 08/19/2013  FINDINGS: The lungs are clear and negative for focal airspace consolidation, pulmonary edema or suspicious pulmonary nodule. No pleural effusion or pneumothorax. Cardiac and mediastinal contours are within normal limits. No acute fracture or lytic or blastic osseous lesions. The visualized upper abdominal bowel gas pattern is unremarkable.  IMPRESSION: No active cardiopulmonary disease.   Electronically Signed   By: Malachy Moan M.D.   On: 06/19/2014 22:26     EKG Interpretation None      Patient seen and examined. Medications ordered.   Vital signs reviewed and are as follows: BP 126/80  Pulse 60  Temp(Src) 98.7 F (37.1 C) (Oral)  Resp 20  Wt 180 lb (81.647 kg)  SpO2 99%  I spoke with patient and mother extensively at bedside. Given positive strep tests of family members and patient's symptoms -- they would feel more comfortable with get IM bicillin here. I do not think this is entirely unreasonable given these exposures.   Pt counseled to continue NSAIDs and rest at home.   Patient counseled on supportive care for viral URI and s/s to return including worsening symptoms, persistent fever, persistent vomiting, or if they have any other concerns. Urged to see PCP if symptoms persist for more than 3 days. Patient verbalizes understanding and agrees  with plan.    MDM   Final diagnoses:  Pharyngitis   Patient with syndrome consistent with pharyngitis. He has neck pain but good mobility of neck, no fever. I doubt meningitis. Do not feel LP would be of much utility at this time. Patient appears very well, non-toxic. Treated in ED with decadron, Bicillin, toradol, fluids. Labs are unremarkable. Discharge to home with supportive care indicated and felt appropriate. Appropriate return instructions discussed with patient and mother including signs such as severe HA, fever, confusion that would indicate worsening meningitis and should prompt immediate return.   No dangerous or life-threatening conditions suspected or identified by history, physical exam, and by work-up. No indications for hospitalization identified.       Renne Crigler, PA-C 06/20/14 [phone]

## 2014-06-20 NOTE — Discharge Instructions (Signed)
Please read and follow all provided instructions.  Your diagnoses today include:  1. Pharyngitis     Tests performed today include:  Strep test: was negative for strep throat  Strep culture: you will be notified if this comes back positive  Blood counts and electrolytes - normal  Chest x-ray - no pneumonia  Vital signs. See below for your results today.   Medications prescribed:   Naproxen - anti-inflammatory pain medication  Do not exceed  naproxen every 12 hours, take with food  You have been prescribed an anti-inflammatory medication or NSAID. Take with food. Take smallest effective dose for the shortest duration needed for your pain. Stop taking if you experience stomach pain or vomiting.   Home care instructions:  Please read the educational materials provided and follow any instructions contained in this packet.  Follow-up instructions: Please follow-up with your primary care provider as needed for further evaluation of your symptoms.   Return instructions:   Please return to the Emergency Department if you experience worsening symptoms.   Return if you have worsening problems swallowing, your neck becomes swollen, you cannot swallow your saliva or your voice becomes muffled.   Return with high persistent fever, persistent vomiting, or if you have trouble breathing.   Please return if you have any other emergent concerns.  Additional Information:  Your vital signs today were: BP 126/80   Pulse 60   Temp(Src) 98.7 F (37.1 C) (Oral)   Resp 20   Wt 180 lb (81.647 kg)   SpO2 99% If your blood pressure (BP) was elevated above 135/85 this visit, please have this repeated by your doctor within one month. --------------

## 2014-06-21 LAB — CULTURE, GROUP A STREP

## 2014-06-22 NOTE — ED Provider Notes (Signed)
Medical screening examination/treatment/procedure(s) were performed by non-physician practitioner and as supervising physician I was immediately available for consultation/collaboration.   EKG Interpretation   Date/Time:  Monday June 19 2014 22:16:21 EDT Ventricular Rate:  56 PR Interval:  147 QRS Duration: 88 QT Interval:  372 QTC Calculation: 359 R Axis:   70 Text Interpretation:  Sinus rhythm Baseline wander in lead(s) V2 ED  PHYSICIAN INTERPRETATION AVAILABLE IN [facility] Confirmed by TEST,  Record ([id number]) on 06/21/2014 8:02:07 AM       Derwood Kaplan, MD 06/22/14 6045

## 2014-06-28 ENCOUNTER — Encounter (HOSPITAL_COMMUNITY): Payer: Self-pay | Admitting: Emergency Medicine

## 2014-06-28 ENCOUNTER — Emergency Department (HOSPITAL_COMMUNITY): Payer: Self-pay

## 2014-06-28 DIAGNOSIS — M949 Disorder of cartilage, unspecified: Secondary | ICD-10-CM

## 2014-06-28 DIAGNOSIS — Y9289 Other specified places as the place of occurrence of the external cause: Secondary | ICD-10-CM | POA: Insufficient documentation

## 2014-06-28 DIAGNOSIS — Z862 Personal history of diseases of the blood and blood-forming organs and certain disorders involving the immune mechanism: Secondary | ICD-10-CM | POA: Insufficient documentation

## 2014-06-28 DIAGNOSIS — Z79899 Other long term (current) drug therapy: Secondary | ICD-10-CM | POA: Insufficient documentation

## 2014-06-28 DIAGNOSIS — X500XXA Overexertion from strenuous movement or load, initial encounter: Secondary | ICD-10-CM | POA: Insufficient documentation

## 2014-06-28 DIAGNOSIS — S93409A Sprain of unspecified ligament of unspecified ankle, initial encounter: Secondary | ICD-10-CM | POA: Insufficient documentation

## 2014-06-28 DIAGNOSIS — S8990XA Unspecified injury of unspecified lower leg, initial encounter: Secondary | ICD-10-CM | POA: Insufficient documentation

## 2014-06-28 DIAGNOSIS — S99929A Unspecified injury of unspecified foot, initial encounter: Secondary | ICD-10-CM

## 2014-06-28 DIAGNOSIS — M899 Disorder of bone, unspecified: Secondary | ICD-10-CM | POA: Insufficient documentation

## 2014-06-28 DIAGNOSIS — S99919A Unspecified injury of unspecified ankle, initial encounter: Secondary | ICD-10-CM

## 2014-06-28 DIAGNOSIS — Z7982 Long term (current) use of aspirin: Secondary | ICD-10-CM | POA: Insufficient documentation

## 2014-06-28 DIAGNOSIS — Y9339 Activity, other involving climbing, rappelling and jumping off: Secondary | ICD-10-CM | POA: Insufficient documentation

## 2014-06-28 NOTE — ED Notes (Signed)
Pt reports twisting his R ankle when trying to chase his niece earlier today.  Pt is ambulatory with a limp

## 2014-06-29 ENCOUNTER — Emergency Department (HOSPITAL_COMMUNITY)
Admission: EM | Admit: 2014-06-29 | Discharge: 2014-06-29 | Disposition: A | Discharge status: Home / Self Care | Payer: Self-pay | Attending: Emergency Medicine | Admitting: Emergency Medicine

## 2014-06-29 DIAGNOSIS — S93401A Sprain of unspecified ligament of right ankle, initial encounter: Secondary | ICD-10-CM

## 2014-06-29 DIAGNOSIS — M899 Disorder of bone, unspecified: Secondary | ICD-10-CM

## 2014-06-29 MED ORDER — HYDROCODONE-ACETAMINOPHEN 5-325 MG PO TABS: 1.0000 | ORAL_TABLET | ORAL | Status: DC | PRN

## 2014-06-29 MED ORDER — IBUPROFEN 800 MG PO TABS
800.0000 mg | ORAL_TABLET | Freq: Once | ORAL | Status: AC
  Administered 2014-06-29: 800 mg via ORAL
  Filled 2014-06-29: qty 1

## 2014-06-29 MED ORDER — IBUPROFEN 600 MG PO TABS: 600.0000 mg | ORAL_TABLET | Freq: Four times a day (QID) | ORAL | Status: DC | PRN

## 2014-06-29 MED ORDER — HYDROCODONE-ACETAMINOPHEN 5-325 MG PO TABS
1.0000 | ORAL_TABLET | Freq: Once | ORAL | Status: AC
  Administered 2014-06-29: 1 via ORAL
  Filled 2014-06-29: qty 1

## 2014-06-29 NOTE — ED Provider Notes (Signed)
Medical screening examination/treatment/procedure(s) were performed by non-physician practitioner and as supervising physician I was immediately available for consultation/collaboration.   EKG Interpretation None        Purvis Sheffield, MD 06/29/14 [phone]

## 2014-06-29 NOTE — Discharge Instructions (Signed)
Ibuprofen for pain. norco for severe pain. Keep ankle elevated. Ice. ASO brace. Crutches. Follow up with your doctor.   It is also recommended that you follow up to have an MRI done for the incidental finding of a bone lesion on your x-ray.    Ankle Sprain An ankle sprain is an injury to the strong, fibrous tissues (ligaments) that hold the bones of your ankle joint together.  CAUSES An ankle sprain is usually caused by a fall or by twisting your ankle. Ankle sprains most commonly occur when you step on the outer edge of your foot, and your ankle turns inward. People who participate in sports are more prone to these types of injuries.  SYMPTOMS   Pain in your ankle. The pain may be present at rest or only when you are trying to stand or walk.  Swelling.  Bruising. Bruising may develop immediately or within 1 to 2 days after your injury.  Difficulty standing or walking, particularly when turning corners or changing directions. DIAGNOSIS  Your caregiver will ask you details about your injury and perform a physical exam of your ankle to determine if you have an ankle sprain. During the physical exam, your caregiver will press on and apply pressure to specific areas of your foot and ankle. Your caregiver will try to move your ankle in certain ways. An X-ray exam may be done to be sure a bone was not broken or a ligament did not separate from one of the bones in your ankle (avulsion fracture).  TREATMENT  Certain types of braces can help stabilize your ankle. Your caregiver can make a recommendation for this. Your caregiver may recommend the use of medicine for pain. If your sprain is severe, your caregiver may refer you to a surgeon who helps to restore function to parts of your skeletal system (orthopedist) or a physical therapist. HOME CARE INSTRUCTIONS   Apply ice to your injury for 1-2 days or as directed by your caregiver. Applying ice helps to reduce inflammation and pain.  Put ice in a  plastic bag.  Place a towel between your skin and the bag.  Leave the ice on for 15-20 minutes at a time, every 2 hours while you are awake.  Only take over-the-counter or prescription medicines for pain, discomfort, or fever as directed by your caregiver.  Elevate your injured ankle above the level of your heart as much as possible for 2-3 days.  If your caregiver recommends crutches, use them as instructed. Gradually put weight on the affected ankle. Continue to use crutches or a cane until you can walk without feeling pain in your ankle.  If you have a plaster splint, wear the splint as directed by your caregiver. Do not rest it on anything harder than a pillow for the first 24 hours. Do not put weight on it. Do not get it wet. You may take it off to take a shower or bath.  You may have been given an elastic bandage to wear around your ankle to provide support. If the elastic bandage is too tight (you have numbness or tingling in your foot or your foot becomes cold and blue), adjust the bandage to make it comfortable.  If you have an air splint, you may blow more air into it or let air out to make it more comfortable. You may take your splint off at night and before taking a shower or bath. Wiggle your toes in the splint several times per day to  decrease swelling. SEEK MEDICAL CARE IF:   You have rapidly increasing bruising or swelling.  Your toes feel extremely cold or you lose feeling in your foot.  Your pain is not relieved with medicine. SEEK IMMEDIATE MEDICAL CARE IF:  Your toes are numb or blue.  You have severe pain that is increasing. MAKE SURE YOU:   Understand these instructions.  Will watch your condition.  Will get help right away if you are not doing well or get worse. Document Released: 09/22/2005 Document Revised: 06/16/2012 Document Reviewed: 10/04/2011 [facility] Patient Information 2015 [place], [place]. This information is not intended to replace advice given  to you by your health care provider. Make sure you discuss any questions you have with your health care provider.

## 2014-06-29 NOTE — ED Provider Notes (Signed)
CSN: [id number]     Arrival date & time 06/28/14  2326 History   First MD Initiated Contact with Patient 06/29/14 0056     Chief Complaint  Patient presents with  . Ankle Pain     (Consider location/radiation/quality/duration/timing/severity/associated sxs/prior Treatment) HPI Larry Estes is a 23 y.o. male with no medical problems, presents emergency department after a right ankle injury. Patient states that he was playing around with his knees, jumped off of the bed and twisted his right ankle. States pain to the outside of the ankle. Unable to bear weight. Denies history of prior ankle injury. No other injuries. No numbness or weakness in his foot. No medications tried prior to coming in. States movement, palpation of the ankle, appearing weight causes him to have pain. Nothing makes his pain better. Pain does not radiate.  Past Medical History  Diagnosis Date  . Allergic angiitis   . Multiple allergies   . Sickle cell trait    History reviewed. No pertinent past surgical history. No family history on file. History  Substance Use Topics  . Smoking status: Never Smoker   . Smokeless tobacco: Never Used  . Alcohol Use: No    Review of Systems  Constitutional: Negative for fever and chills.  Respiratory: Negative for cough, chest tightness and shortness of breath.   Cardiovascular: Negative for chest pain, palpitations and leg swelling.  Musculoskeletal: Positive for arthralgias and joint swelling. Negative for myalgias, neck pain and neck stiffness.  Skin: Negative for rash.  Allergic/Immunologic: Negative for immunocompromised state.  Neurological: Negative for dizziness, weakness, light-headedness, numbness and headaches.      Allergies  Tramadol and Fish allergy  Home Medications   Prior to Admission medications   Medication Sig Start Date End Date Taking? Authorizing Provider  acetaminophen (TYLENOL) 500 MG tablet Take 1,000 mg by mouth every 6 (six) hours as  needed for moderate pain.   Yes Historical Provider, MD  Aspirin-Salicylamide-Caffeine (BC HEADACHE POWDER PO) Take 1 packet by mouth as needed (pain or headache).   Yes Historical Provider, MD  Multiple Vitamin (MULTIVITAMIN WITH MINERALS) TABS tablet Take 1 tablet by mouth daily.   Yes Historical Provider, MD   BP 135/69  Pulse 72  Temp(Src) 98.3 F (36.8 C) (Oral)  Resp 16  SpO2 100% Physical Exam  Nursing note and vitals reviewed. Constitutional: He appears well-developed and well-nourished. No distress.  HENT:  Head: Normocephalic and atraumatic.  Eyes: Conjunctivae are normal.  Neck: Neck supple.  Cardiovascular: Normal rate, regular rhythm and normal heart sounds.   Pulmonary/Chest: Effort normal. No respiratory distress. He has no wheezes. He has no rales.  Musculoskeletal:  Normal appearing ankle with no swelling or deformity. Tender to palpation over lateral malleolus. Pain with any range of motion of the ankle joint including dorsiflexion, plantar flexion, ED version, inversion. Normal foot. Dorsal pedal pulses intact. Cap refill less than 2 seconds distally. Achilles tendon nontender and is intact  Neurological: He is alert.  Skin: Skin is warm and dry.    ED Course  Procedures (including critical care time) Labs Review Labs Reviewed - No data to display  Imaging Review Dg Ankle Complete Right  06/29/2014   CLINICAL DATA:  Running, twisted ankle with pain and swelling lateral malleolus.  EXAM: RIGHT ANKLE - COMPLETE 3+ VIEW  COMPARISON:  None.  FINDINGS: No acute fracture deformity or dislocation. Joint space intact without erosions. Lateral tibial lucent lesion in the metaphysis with thickened geographic peripheral sclerosis. The Soft  tissue planes are not suspicious.  IMPRESSION: No acute fracture deformity or dislocation.  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    Electronically Signed   By: Awilda Metro   On: 06/29/2014 00:10     EKG Interpretation None      MDM   Final diagnoses:  Ankle sprain, right, initial encounter  Bone lesion   Patient with right ankle injury after twisting and while jumping off the bed. X-rays negative with exception of an incidental finding of a lateral tibial metaphysis lesion, and recommended outpatient MRI. This was discussed with patient. He will followup. There is some crutches provided. Home with pain medications and followup with primary care doctor.   Filed Vitals:   06/28/14 2341  BP: 135/69  Pulse: 72  Temp: 98.3 F (36.8 C)  TempSrc: Oral  Resp: 16  SpO2: 100%       Lottie Mussel, PA-C 06/29/14 0535

## 2014-10-02 ENCOUNTER — Emergency Department (HOSPITAL_COMMUNITY): Payer: Self-pay

## 2014-10-02 ENCOUNTER — Emergency Department (HOSPITAL_COMMUNITY)
Admission: EM | Admit: 2014-10-02 | Discharge: 2014-10-03 | Disposition: A | Discharge status: Home / Self Care | Payer: Self-pay | Attending: Emergency Medicine | Admitting: Emergency Medicine

## 2014-10-02 ENCOUNTER — Encounter (HOSPITAL_COMMUNITY): Payer: Self-pay

## 2014-10-02 DIAGNOSIS — Z8739 Personal history of other diseases of the musculoskeletal system and connective tissue: Secondary | ICD-10-CM | POA: Insufficient documentation

## 2014-10-02 DIAGNOSIS — H539 Unspecified visual disturbance: Secondary | ICD-10-CM | POA: Insufficient documentation

## 2014-10-02 DIAGNOSIS — Z862 Personal history of diseases of the blood and blood-forming organs and certain disorders involving the immune mechanism: Secondary | ICD-10-CM | POA: Insufficient documentation

## 2014-10-02 DIAGNOSIS — R5383 Other fatigue: Secondary | ICD-10-CM | POA: Insufficient documentation

## 2014-10-02 DIAGNOSIS — R51 Headache: Secondary | ICD-10-CM | POA: Insufficient documentation

## 2014-10-02 DIAGNOSIS — R519 Headache, unspecified: Secondary | ICD-10-CM

## 2014-10-02 DIAGNOSIS — Z8679 Personal history of other diseases of the circulatory system: Secondary | ICD-10-CM | POA: Insufficient documentation

## 2014-10-02 DIAGNOSIS — R42 Dizziness and giddiness: Secondary | ICD-10-CM | POA: Insufficient documentation

## 2014-10-02 DIAGNOSIS — Z79899 Other long term (current) drug therapy: Secondary | ICD-10-CM | POA: Insufficient documentation

## 2014-10-02 LAB — CBC WITH DIFFERENTIAL/PLATELET
BASOS ABS: 0 10*3/uL (ref 0.0–0.1)
Basophils Relative: 0 % (ref 0–1)
Eosinophils Absolute: 0.1 10*3/uL (ref 0.0–0.7)
Eosinophils Relative: 1 % (ref 0–5)
HCT: 45.2 % (ref 39.0–52.0)
Hemoglobin: 15.2 g/dL (ref 13.0–17.0)
Lymphocytes Relative: 20 % (ref 12–46)
Lymphs Abs: 1.3 10*3/uL (ref 0.7–4.0)
MCH: 27.9 pg (ref 26.0–34.0)
MCHC: 33.6 g/dL (ref 30.0–36.0)
MCV: 83.1 fL (ref 78.0–100.0)
Monocytes Absolute: 0.6 10*3/uL (ref 0.1–1.0)
Monocytes Relative: 10 % (ref 3–12)
NEUTROS PCT: 69 % (ref 43–77)
Neutro Abs: 4.5 10*3/uL (ref 1.7–7.7)
PLATELETS: 295 10*3/uL (ref 150–400)
RBC: 5.44 MIL/uL (ref 4.22–5.81)
RDW: 13.9 % (ref 11.5–15.5)
WBC: 6.6 10*3/uL (ref 4.0–10.5)

## 2014-10-02 LAB — URINE MICROSCOPIC-ADD ON

## 2014-10-02 LAB — COMPREHENSIVE METABOLIC PANEL
ALBUMIN: 4.5 g/dL (ref 3.5–5.2)
ALK PHOS: 47 U/L (ref 39–117)
ALT: 22 U/L (ref 0–53)
AST: 30 U/L (ref 0–37)
Anion gap: 7 (ref 5–15)
BUN: 8 mg/dL (ref 6–23)
CALCIUM: 9.6 mg/dL (ref 8.4–10.5)
CO2: 26 mmol/L (ref 19–32)
Chloride: 106 mEq/L (ref 96–112)
Creatinine, Ser: 0.91 mg/dL (ref 0.50–1.35)
GFR calc Af Amer: >90 mL/min (ref >90)
GFR calc non Af Amer: >90 mL/min (ref >90)
Glucose, Bld: 84 mg/dL (ref 70–99)
POTASSIUM: 3.9 mmol/L (ref 3.5–5.1)
Sodium: 139 mmol/L (ref 135–145)
TOTAL PROTEIN: 7.8 g/dL (ref 6.0–8.3)
Total Bilirubin: 0.7 mg/dL (ref 0.3–1.2)

## 2014-10-02 LAB — URINALYSIS, ROUTINE W REFLEX MICROSCOPIC
BILIRUBIN URINE: NEGATIVE
Glucose, UA: NEGATIVE mg/dL
Ketones, ur: NEGATIVE mg/dL
Leukocytes, UA: NEGATIVE
NITRITE: NEGATIVE
PH: 6.5 (ref 5.0–8.0)
Protein, ur: NEGATIVE mg/dL
Specific Gravity, Urine: 1.013 (ref 1.005–1.030)
Urobilinogen, UA: 0.2 mg/dL (ref 0.0–1.0)

## 2014-10-02 MED ORDER — METOCLOPRAMIDE HCL 5 MG/ML IJ SOLN
10.0000 mg | Freq: Once | INTRAMUSCULAR | Status: AC
  Administered 2014-10-02: 10 mg via INTRAVENOUS
  Filled 2014-10-02: qty 2

## 2014-10-02 MED ORDER — KETOROLAC TROMETHAMINE 30 MG/ML IJ SOLN
30.0000 mg | Freq: Once | INTRAMUSCULAR | Status: AC
  Administered 2014-10-02: 30 mg via INTRAVENOUS
  Filled 2014-10-02: qty 1

## 2014-10-02 MED ORDER — SODIUM CHLORIDE 0.9 % IV BOLUS (SEPSIS)
1000.0000 mL | Freq: Once | INTRAVENOUS | Status: AC
  Administered 2014-10-02: 1000 mL via INTRAVENOUS

## 2014-10-02 MED ORDER — MAGNESIUM SULFATE IN D5W 10-5 MG/ML-% IV SOLN
1.0000 g | Freq: Once | INTRAVENOUS | Status: AC
  Administered 2014-10-02: 1 g via INTRAVENOUS
  Filled 2014-10-02: qty 100

## 2014-10-02 MED ORDER — DEXAMETHASONE SODIUM PHOSPHATE 10 MG/ML IJ SOLN
10.0000 mg | Freq: Once | INTRAMUSCULAR | Status: AC
  Administered 2014-10-02: 10 mg via INTRAVENOUS
  Filled 2014-10-02: qty 1

## 2014-10-02 MED ORDER — DIPHENHYDRAMINE HCL 50 MG/ML IJ SOLN
25.0000 mg | Freq: Once | INTRAMUSCULAR | Status: AC
  Administered 2014-10-02: 25 mg via INTRAVENOUS
  Filled 2014-10-02: qty 1

## 2014-10-02 NOTE — Discharge Instructions (Signed)

## 2014-10-02 NOTE — ED Notes (Addendum)
Pt c/o dizziness, headache and fatigue x 1 week.  Pt states loss of balance today.  Vomiting.  Decreased energy.  Increased urinary frequency.  Blurred vision

## 2014-10-02 NOTE — ED Provider Notes (Signed)
CSN: [id number]     Arrival date & time 10/02/14  1811 History   First MD Initiated Contact with Patient 10/02/14 2011     Chief Complaint  Patient presents with  . Headache  . Fatigue     (Consider location/radiation/quality/duration/timing/severity/associated sxs/prior Treatment) Patient is a 23 y.o. male presenting with headaches. The history is provided by the patient.  Headache Pain location:  Generalized Quality:  Sharp Radiates to:  Does not radiate Onset quality:  Sudden Duration:  7 days Timing:  Intermittent Progression:  Waxing and waning Chronicity:  New Similar to prior headaches: no   Context: not coughing   Context comment:  Spontaneously Relieved by:  Nothing Worsened by:  Nothing tried Associated symptoms: dizziness (with difficulty ambulating) and visual change   Associated symptoms: no abdominal pain, no cough, no fever, no nausea, no syncope, no tingling and no vomiting     Past Medical History  Diagnosis Date  . Allergic angiitis   . Multiple allergies   . Sickle cell trait    History reviewed. No pertinent past surgical history. History reviewed. No pertinent family history. History  Substance Use Topics  . Smoking status: Never Smoker   . Smokeless tobacco: Never Used  . Alcohol Use: No    Review of Systems  Constitutional: Negative for fever.  Respiratory: Negative for cough and shortness of breath.   Cardiovascular: Negative for syncope.  Gastrointestinal: Negative for nausea, vomiting and abdominal pain.  Neurological: Positive for dizziness (with difficulty ambulating) and headaches.  All other systems reviewed and are negative.     Allergies  Tramadol and Fish allergy  Home Medications   Prior to Admission medications   Medication Sig Start Date End Date Taking? Authorizing Provider  acetaminophen (TYLENOL) 500 MG tablet Take 1,000 mg by mouth every 6 (six) hours as needed for moderate pain.    Historical Provider, MD   Aspirin-Salicylamide-Caffeine (BC HEADACHE POWDER PO) Take 1 packet by mouth as needed (pain or headache).    Historical Provider, MD  HYDROcodone-acetaminophen (NORCO/VICODIN) 5-325 MG per tablet Take 1 tablet by mouth every 4 (four) hours as needed for moderate pain or severe pain. Patient not taking: Reported on 10/02/2014 06/29/14   Tatyana A Kirichenko, PA-C  ibuprofen (ADVIL,MOTRIN) 600 MG tablet Take 1 tablet (600 mg total) by mouth every 6 (six) hours as needed. Patient not taking: Reported on 10/02/2014 06/29/14   Tatyana A Kirichenko, PA-C  Multiple Vitamin (MULTIVITAMIN WITH MINERALS) TABS tablet Take 1 tablet by mouth daily.    Historical Provider, MD   BP 128/58 mmHg  Pulse 66  Temp(Src) 97.3 F (36.3 C) (Oral)  Resp 16  SpO2 96% Physical Exam  Constitutional: He is oriented to person, place, and time. He appears well-developed and well-nourished. No distress.  HENT:  Head: Normocephalic and atraumatic.  Mouth/Throat: Oropharynx is clear and moist. No oropharyngeal exudate.  Eyes: EOM are normal. Pupils are equal, round, and reactive to light.  Neck: Normal range of motion. Neck supple.  Cardiovascular: Normal rate and regular rhythm.  Exam reveals no friction rub.   No murmur heard. Pulmonary/Chest: Effort normal and breath sounds normal. No respiratory distress. He has no wheezes. He has no rales.  Abdominal: Soft. He exhibits no distension. There is no tenderness. There is no rebound.  Musculoskeletal: Normal range of motion. He exhibits no edema.  Neurological: He is alert and oriented to person, place, and time. No cranial nerve deficit. He exhibits normal muscle tone. Coordination normal.  Skin: No rash noted. He is not diaphoretic.  Nursing note and vitals reviewed.   ED Course  Procedures (including critical care time) Labs Review Labs Reviewed  CBC WITH DIFFERENTIAL  COMPREHENSIVE METABOLIC PANEL  URINALYSIS, ROUTINE W REFLEX MICROSCOPIC    Imaging  Review No results found.   EKG Interpretation None      MDM   Final diagnoses:  None    49M presents with headache for 7 days. Acute onset 7 days ago, throbbing, dizziness, fatigue. Also having blurry vision. He had some balance difficulties today. Hx of migraines, not regularly having migraines though. AFVSS here. Well-appearing, neurologically intact. Will treat with headache cocktail and scan his head. CT Head normal. Feeling much better after meds. Stable for discharge.   Elwin MochaBlair Luria Rosario, MD 10/03/14 [phone]

## 2014-12-15 ENCOUNTER — Encounter (HOSPITAL_COMMUNITY): Payer: Self-pay | Admitting: Emergency Medicine

## 2014-12-15 ENCOUNTER — Emergency Department (HOSPITAL_COMMUNITY)
Admission: EM | Admit: 2014-12-15 | Discharge: 2014-12-16 | Disposition: A | Discharge status: Home / Self Care | Payer: Self-pay | Attending: Emergency Medicine | Admitting: Emergency Medicine

## 2014-12-15 DIAGNOSIS — Z862 Personal history of diseases of the blood and blood-forming organs and certain disorders involving the immune mechanism: Secondary | ICD-10-CM | POA: Insufficient documentation

## 2014-12-15 DIAGNOSIS — A084 Viral intestinal infection, unspecified: Secondary | ICD-10-CM | POA: Insufficient documentation

## 2014-12-15 DIAGNOSIS — Z8739 Personal history of other diseases of the musculoskeletal system and connective tissue: Secondary | ICD-10-CM | POA: Insufficient documentation

## 2014-12-15 DIAGNOSIS — Z79899 Other long term (current) drug therapy: Secondary | ICD-10-CM | POA: Insufficient documentation

## 2014-12-15 LAB — CBC WITH DIFFERENTIAL/PLATELET
BASOS ABS: 0 10*3/uL (ref 0.0–0.1)
Basophils Relative: 0 % (ref 0–1)
Eosinophils Absolute: 0.1 10*3/uL (ref 0.0–0.7)
Eosinophils Relative: 2 % (ref 0–5)
HCT: 44.5 % (ref 39.0–52.0)
HEMOGLOBIN: 14.9 g/dL (ref 13.0–17.0)
LYMPHS PCT: 27 % (ref 12–46)
Lymphs Abs: 1.4 10*3/uL (ref 0.7–4.0)
MCH: 28.3 pg (ref 26.0–34.0)
MCHC: 33.5 g/dL (ref 30.0–36.0)
MCV: 84.6 fL (ref 78.0–100.0)
MONO ABS: 0.6 10*3/uL (ref 0.1–1.0)
MONOS PCT: 11 % (ref 3–12)
NEUTROS ABS: 3 10*3/uL (ref 1.7–7.7)
NEUTROS PCT: 60 % (ref 43–77)
Platelets: 265 10*3/uL (ref 150–400)
RBC: 5.26 MIL/uL (ref 4.22–5.81)
RDW: 13.7 % (ref 11.5–15.5)
WBC: 5.1 10*3/uL (ref 4.0–10.5)

## 2014-12-15 LAB — COMPREHENSIVE METABOLIC PANEL
ALBUMIN: 4.3 g/dL (ref 3.5–5.2)
ALT: 24 U/L (ref 0–53)
AST: 33 U/L (ref 0–37)
Alkaline Phosphatase: 46 U/L (ref 39–117)
Anion gap: 1 — ABNORMAL LOW (ref 5–15)
BILIRUBIN TOTAL: 1.1 mg/dL (ref 0.3–1.2)
BUN: 9 mg/dL (ref 6–23)
CHLORIDE: 105 mmol/L (ref 96–112)
CO2: 30 mmol/L (ref 19–32)
CREATININE: 0.81 mg/dL (ref 0.50–1.35)
Calcium: 9.1 mg/dL (ref 8.4–10.5)
GFR calc Af Amer: >90 mL/min (ref >90)
GFR calc non Af Amer: >90 mL/min (ref >90)
Glucose, Bld: 86 mg/dL (ref 70–99)
POTASSIUM: 3.6 mmol/L (ref 3.5–5.1)
Sodium: 136 mmol/L (ref 135–145)
TOTAL PROTEIN: 7.7 g/dL (ref 6.0–8.3)

## 2014-12-15 NOTE — ED Notes (Signed)
Pt reports he had sudden onset of abdominal pain, emesis and diarrhea. Pt states he believes he has vomited x8 and had x7 episodes of diarrhea.

## 2014-12-16 MED ORDER — ONDANSETRON HCL 4 MG PO TABS: 4.0000 mg | ORAL_TABLET | Freq: Four times a day (QID) | ORAL | Status: DC

## 2014-12-16 MED ORDER — FENTANYL CITRATE 0.05 MG/ML IJ SOLN
50.0000 ug | Freq: Once | INTRAMUSCULAR | Status: AC
  Administered 2014-12-16: 50 ug via INTRAVENOUS
  Filled 2014-12-16: qty 2

## 2014-12-16 MED ORDER — SODIUM CHLORIDE 0.9 % IV BOLUS (SEPSIS)
1000.0000 mL | Freq: Once | INTRAVENOUS | Status: AC
  Administered 2014-12-16: 1000 mL via INTRAVENOUS

## 2014-12-16 MED ORDER — NAPROXEN 500 MG PO TABS: 500.0000 mg | ORAL_TABLET | Freq: Two times a day (BID) | ORAL | Status: DC

## 2014-12-16 MED ORDER — ONDANSETRON HCL 4 MG/2ML IJ SOLN
4.0000 mg | Freq: Once | INTRAMUSCULAR | Status: AC
Start: 2014-12-16 — End: 2014-12-16
  Administered 2014-12-16: 4 mg via INTRAVENOUS
  Filled 2014-12-16: qty 2

## 2014-12-16 NOTE — ED Notes (Signed)
Patient given ginger ale. 

## 2014-12-16 NOTE — Discharge Instructions (Signed)
Recommend he drink plenty of clear liquids to prevent dehydration. Take Zofran as needed for nausea/vomiting and naproxen as prescribed for pain. Avoid fatty, greasy, or fried foods as well as milk products as this will likely cause you to vomit. Follow-up with your primary care doctor for a recheck of symptoms.  Viral Gastroenteritis Viral gastroenteritis is also known as stomach flu. This condition affects the stomach and intestinal tract. It can cause sudden diarrhea and vomiting. The illness typically lasts 3 to 8 days. Most people develop an immune response that eventually gets rid of the virus. While this natural response develops, the virus can make you quite ill. CAUSES  Many different viruses can cause gastroenteritis, such as rotavirus or noroviruses. You can catch one of these viruses by consuming contaminated food or water. You may also catch a virus by sharing utensils or other personal items with an infected person or by touching a contaminated surface. SYMPTOMS  The most common symptoms are diarrhea and vomiting. These problems can cause a severe loss of body fluids (dehydration) and a body salt (electrolyte) imbalance. Other symptoms may include:  Fever.  Headache.  Fatigue.  Abdominal pain. DIAGNOSIS  Your caregiver can usually diagnose viral gastroenteritis based on your symptoms and a physical exam. A stool sample may also be taken to test for the presence of viruses or other infections. TREATMENT  This illness typically goes away on its own. Treatments are aimed at rehydration. The most serious cases of viral gastroenteritis involve vomiting so severely that you are not able to keep fluids down. In these cases, fluids must be given through an intravenous line (IV). HOME CARE INSTRUCTIONS   Drink enough fluids to keep your urine clear or pale yellow. Drink small amounts of fluids frequently and increase the amounts as tolerated.  Ask your caregiver for specific rehydration  instructions.  Avoid:  Foods high in sugar.  Alcohol.  Carbonated drinks.  Tobacco.  Juice.  Caffeine drinks.  Extremely hot or cold fluids.  Fatty, greasy foods.  Too much intake of anything at one time.  Dairy products until 24 to 48 hours after diarrhea stops.  You may consume probiotics. Probiotics are active cultures of beneficial bacteria. They may lessen the amount and number of diarrheal stools in adults. Probiotics can be found in yogurt with active cultures and in supplements.  Wash your hands well to avoid spreading the virus.  Only take over-the-counter or prescription medicines for pain, discomfort, or fever as directed by your caregiver. Do not give aspirin to children. Antidiarrheal medicines are not recommended.  Ask your caregiver if you should continue to take your regular prescribed and over-the-counter medicines.  Keep all follow-up appointments as directed by your caregiver. SEEK IMMEDIATE MEDICAL CARE IF:   You are unable to keep fluids down.  You do not urinate at least once every 6 to 8 hours.  You develop shortness of breath.  You notice blood in your stool or vomit. This may look like coffee grounds.  You have abdominal pain that increases or is concentrated in one small area (localized).  You have persistent vomiting or diarrhea.  You have a fever.  The patient is a child younger than 3 months, and he or she has a fever.  The patient is a child older than 3 months, and he or she has a fever and persistent symptoms.  The patient is a child older than 3 months, and he or she has a fever and symptoms suddenly get  worse.  The patient is a baby, and he or she has no tears when crying. MAKE SURE YOU:   Understand these instructions.  Will watch your condition.  Will get help right away if you are not doing well or get worse. Document Released: 09/22/2005 Document Revised: 12/15/2011 Document Reviewed: 07/09/2011 [facility] Patient  Information 2015 [employer], [place]. This information is not intended to replace advice given to you by your health care provider. Make sure you discuss any questions you have with your health care provider.

## 2014-12-16 NOTE — ED Provider Notes (Signed)
CSN: [id number]     Arrival date & time 12/15/14  1914 History   First MD Initiated Contact with Patient 12/16/14 0055     Chief Complaint  Patient presents with  . Abdominal Pain  . Nausea  . Emesis    (Consider location/radiation/quality/duration/timing/severity/associated sxs/prior Treatment) HPI Comments: 24 year old male with a history of sickle cell trait presents to the emergency department for further evaluation of vomiting and diarrhea. Symptoms have been intermittent over the past 2 days, though he reports preceding nausea for 1 week. Patient has had associated headache which she describes as pressure-like and diffuse. He also notes a "piercing" abdominal pain which is generalized. Patient has tried to drink water, but threw it up. He reports sick contacts with similar symptoms at his job. Patient denies associated fever, hematemesis, melena or hematochezia, dysuria, or hematuria. He was able to void 2 hours ago while in the waiting room. No medications taken prior to arrival. No history of abdominal surgeries.  Patient is a 24 y.o. male presenting with abdominal pain and vomiting. The history is provided by the patient. No language interpreter was used.  Abdominal Pain Associated symptoms: chills, diarrhea, nausea and vomiting   Emesis Associated symptoms: abdominal pain, chills, diarrhea and headaches     Past Medical History  Diagnosis Date  . Allergic angiitis   . Multiple allergies   . Sickle cell trait    History reviewed. No pertinent past surgical history. History reviewed. No pertinent family history. History  Substance Use Topics  . Smoking status: Never Smoker   . Smokeless tobacco: Never Used  . Alcohol Use: No    Review of Systems  Constitutional: Positive for chills.  Gastrointestinal: Positive for nausea, vomiting, abdominal pain and diarrhea.  Neurological: Positive for headaches.  All other systems reviewed and are negative.   Allergies  Tramadol  and Fish allergy  Home Medications   Prior to Admission medications   Medication Sig Start Date End Date Taking? Authorizing Provider  Multiple Vitamin (MULTIVITAMIN WITH MINERALS) TABS tablet Take 1 tablet by mouth daily.   Yes Historical Provider, MD  acetaminophen (TYLENOL) 500 MG tablet Take 1,000 mg by mouth every 6 (six) hours as needed for moderate pain.    Historical Provider, MD  Aspirin-Salicylamide-Caffeine (BC HEADACHE POWDER PO) Take 1 packet by mouth as needed (pain or headache).    Historical Provider, MD  HYDROcodone-acetaminophen (NORCO/VICODIN) 5-325 MG per tablet Take 1 tablet by mouth every 4 (four) hours as needed for moderate pain or severe pain. Patient not taking: Reported on 10/02/2014 06/29/14   Tatyana Kirichenko, PA-C  ibuprofen (ADVIL,MOTRIN) 600 MG tablet Take 1 tablet (600 mg total) by mouth every 6 (six) hours as needed. Patient not taking: Reported on 10/02/2014 06/29/14   Tatyana Kirichenko, PA-C  naproxen (NAPROSYN) 500 MG tablet Take 1 tablet (500 mg total) by mouth 2 (two) times daily. 12/16/14   Antony Madura, PA-C  ondansetron (ZOFRAN) 4 MG tablet Take 1 tablet (4 mg total) by mouth every 6 (six) hours. 12/16/14   Antony Madura, PA-C   BP 107/47 mmHg  Pulse 58  Temp(Src) 98.3 F (36.8 C) (Oral)  Resp 18  Ht  (1.702 m)  Wt 185 lb (83.915 kg)  BMI 28.97 kg/m2  SpO2 100%   Physical Exam  Constitutional: He is oriented to person, place, and time. He appears well-developed and well-nourished. No distress.  Nontoxic/nonseptic appearing  HENT:  Head: Normocephalic and atraumatic.  Eyes: Conjunctivae and EOM are normal.  No scleral icterus.  Neck: Normal range of motion.  Cardiovascular: Normal rate, regular rhythm and intact distal pulses.   Pulmonary/Chest: Effort normal and breath sounds normal. No respiratory distress. He has no wheezes. He has no rales.  Respirations even and unlabored  Abdominal: Soft. He exhibits no distension. There is  tenderness. There is no rebound and no guarding.  Generalized TTP. Abdomen soft. No peritoneal signs.  Musculoskeletal: Normal range of motion.  Neurological: He is alert and oriented to person, place, and time. He exhibits normal muscle tone. Coordination normal.  Skin: Skin is warm and dry. No rash noted. He is not diaphoretic. No erythema. No pallor.  Psychiatric: He has a normal mood and affect. His behavior is normal.  Nursing note and vitals reviewed.   ED Course  Procedures (including critical care time) Labs Review Labs Reviewed  COMPREHENSIVE METABOLIC PANEL - Abnormal; Notable for the following:    Anion gap 1 (*)    All other components within normal limits  CBC WITH DIFFERENTIAL/PLATELET    Imaging Review No results found.   EKG Interpretation None      Medications  sodium chloride 0.9 % bolus 1,000 mL (0 mLs Intravenous Stopped 12/16/14 0220)  ondansetron (ZOFRAN) injection 4 mg (4 mg Intravenous Given 12/16/14 0122)  fentaNYL (SUBLIMAZE) injection 50 mcg (50 mcg Intravenous Given 12/16/14 0339)    MDM   Final diagnoses:  Viral gastroenteritis    Patient with symptoms consistent with viral gastroenteritis. Vitals are stable, no fever. No signs of dehydration and patient tolerating PO fluids > 6 oz. Lungs are clear. No focal abdominal pain and serial abdominal reexaminations are stable. No concern for appendicitis, cholecystitis, pancreatitis, ruptured viscus, UTI, kidney stone, or any other abdominal etiology. Supportive therapy indicated with return if symptoms worsen. Return precautions provided and patient agreeable to plan with no unaddressed concerns.   Filed Vitals:   12/16/14 0130 12/16/14 0200 12/16/14 0230 12/16/14 0339  BP: 104/55 113/72 115/60 107/47  Pulse: 48 64 44 58  Temp:    98.3 F (36.8 C)  TempSrc:    Oral  Resp:    18  Height:      Weight:      SpO2: 100% 100% 99% 100%       Antony MaduraKelly Ikeisha Blumberg, PA-C 12/16/14 0411  Paula LibraJohn Molpus,  MD 12/16/14 [phone]

## 2015-01-04 ENCOUNTER — Emergency Department (HOSPITAL_COMMUNITY)
Admission: EM | Admit: 2015-01-04 | Discharge: 2015-01-04 | Disposition: A | Discharge status: Home / Self Care | Payer: Self-pay | Attending: Emergency Medicine | Admitting: Emergency Medicine

## 2015-01-04 ENCOUNTER — Encounter (HOSPITAL_COMMUNITY): Payer: Self-pay | Admitting: Emergency Medicine

## 2015-01-04 DIAGNOSIS — M791 Myalgia, unspecified site: Secondary | ICD-10-CM

## 2015-01-04 DIAGNOSIS — Z791 Long term (current) use of non-steroidal anti-inflammatories (NSAID): Secondary | ICD-10-CM | POA: Insufficient documentation

## 2015-01-04 DIAGNOSIS — R112 Nausea with vomiting, unspecified: Secondary | ICD-10-CM

## 2015-01-04 DIAGNOSIS — F419 Anxiety disorder, unspecified: Secondary | ICD-10-CM | POA: Insufficient documentation

## 2015-01-04 DIAGNOSIS — E876 Hypokalemia: Secondary | ICD-10-CM | POA: Insufficient documentation

## 2015-01-04 DIAGNOSIS — Z862 Personal history of diseases of the blood and blood-forming organs and certain disorders involving the immune mechanism: Secondary | ICD-10-CM | POA: Insufficient documentation

## 2015-01-04 DIAGNOSIS — Z79899 Other long term (current) drug therapy: Secondary | ICD-10-CM | POA: Insufficient documentation

## 2015-01-04 DIAGNOSIS — R197 Diarrhea, unspecified: Secondary | ICD-10-CM

## 2015-01-04 LAB — CBC WITH DIFFERENTIAL/PLATELET
Basophils Absolute: 0 10*3/uL (ref 0.0–0.1)
Basophils Relative: 0 % (ref 0–1)
EOS PCT: 4 % (ref 0–5)
Eosinophils Absolute: 0.2 10*3/uL (ref 0.0–0.7)
HEMATOCRIT: 39.1 % (ref 39.0–52.0)
Hemoglobin: 13.2 g/dL (ref 13.0–17.0)
LYMPHS ABS: 1.1 10*3/uL (ref 0.7–4.0)
Lymphocytes Relative: 18 % (ref 12–46)
MCH: 28.1 pg (ref 26.0–34.0)
MCHC: 33.8 g/dL (ref 30.0–36.0)
MCV: 83.2 fL (ref 78.0–100.0)
MONO ABS: 0.5 10*3/uL (ref 0.1–1.0)
Monocytes Relative: 8 % (ref 3–12)
Neutro Abs: 4.1 10*3/uL (ref 1.7–7.7)
Neutrophils Relative %: 70 % (ref 43–77)
Platelets: 238 10*3/uL (ref 150–400)
RBC: 4.7 MIL/uL (ref 4.22–5.81)
RDW: 13.6 % (ref 11.5–15.5)
WBC: 5.9 10*3/uL (ref 4.0–10.5)

## 2015-01-04 LAB — BASIC METABOLIC PANEL
ANION GAP: 6 (ref 5–15)
BUN: 16 mg/dL (ref 6–23)
CALCIUM: 9 mg/dL (ref 8.4–10.5)
CO2: 25 mmol/L (ref 19–32)
Chloride: 109 mmol/L (ref 96–112)
Creatinine, Ser: 0.99 mg/dL (ref 0.50–1.35)
GFR calc Af Amer: >90 mL/min (ref >90)
Glucose, Bld: 102 mg/dL — ABNORMAL HIGH (ref 70–99)
POTASSIUM: 3.3 mmol/L — AB (ref 3.5–5.1)
SODIUM: 140 mmol/L (ref 135–145)

## 2015-01-04 MED ORDER — ONDANSETRON HCL 4 MG PO TABS: 4.0000 mg | ORAL_TABLET | Freq: Four times a day (QID) | ORAL | Status: DC

## 2015-01-04 MED ORDER — ONDANSETRON HCL 4 MG/2ML IJ SOLN
4.0000 mg | Freq: Once | INTRAMUSCULAR | Status: AC
  Administered 2015-01-04: 4 mg via INTRAVENOUS
  Filled 2015-01-04: qty 2

## 2015-01-04 MED ORDER — SODIUM CHLORIDE 0.9 % IV BOLUS (SEPSIS)
1000.0000 mL | Freq: Once | INTRAVENOUS | Status: AC
  Administered 2015-01-04: 1000 mL via INTRAVENOUS

## 2015-01-04 MED ORDER — KETOROLAC TROMETHAMINE 30 MG/ML IJ SOLN
30.0000 mg | Freq: Once | INTRAMUSCULAR | Status: AC
  Administered 2015-01-04: 30 mg via INTRAVENOUS
  Filled 2015-01-04: qty 1

## 2015-01-04 MED ORDER — POTASSIUM CHLORIDE CRYS ER 20 MEQ PO TBCR
40.0000 meq | EXTENDED_RELEASE_TABLET | Freq: Once | ORAL | Status: AC
  Administered 2015-01-04: 40 meq via ORAL
  Filled 2015-01-04: qty 2

## 2015-01-04 MED ORDER — POTASSIUM CHLORIDE CRYS ER 20 MEQ PO TBCR: 20.0000 meq | EXTENDED_RELEASE_TABLET | Freq: Two times a day (BID) | ORAL | Status: DC

## 2015-01-04 NOTE — ED Notes (Signed)
Pt states he is having flu like symptoms including headache, sore throat, fever, body aches, chills, and body aches  Pt states he is very fatigued  Pt states he has been vomiting for about a week off and on

## 2015-01-04 NOTE — Discharge Instructions (Signed)
Drink plenty of fluids, avoid fried, spicy or greasy foods until for the next several days. You can take zofran for your nausea or vomiting and imodium OTC for your diarrhea. Avoid milk until the diarrhea is gone. You can take Aleve 2 tabs OTC + acetaminophen 1000 mg 4 times a day for your body aches.  You can have Dr Janee Mornhompson recheck your wrists if you continue to have problems.

## 2015-01-04 NOTE — ED Provider Notes (Signed)
CSN: [id number]     Arrival date & time 01/04/15  4098 History   First MD Initiated Contact with Patient 01/04/15 [phone]     Chief Complaint  Patient presents with  . Generalized Body Aches  . Emesis  . Headache     (Consider location/radiation/quality/duration/timing/severity/associated sxs/prior Treatment) HPI  Patient reports for the past 2 weeks he has felt like he was going to catch a cold and he was in the "recovering mode". He reports he's had nausea with vomiting at least 3 times a day for the past week. He also has had diarrhea about 3 times a day for the past week that he describes as watery and loose. He denies abdominal pain or fever. He states it started out with a sore throat 2-1/2 weeks ago and he has some yellow-green rhinorrhea. He states he feels dizzy and lightheaded when he stands up. He complains of diffuse myalgias and body aches and states tonight at work he was so weak in his wrists he felt like he couldn't pick up any packages. He denies any specific injury to his wrists. 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  Of note patient was just seen in the ED on March 12 with complaints of vomiting and diarrhea. At that time he was having diffuse abdominal pain. He states he got better than restarted again.  PCP Dr Shelda Pal  Past Medical History  Diagnosis Date  . Allergic angiitis   . Multiple allergies   . Sickle cell trait    History reviewed. No pertinent past surgical history. Family History  Problem Relation Age of Onset  . CAD Other   . Hypertension Other   . Diabetes Other    History  Substance Use Topics  . Smoking status: Never Smoker   . Smokeless tobacco: Never Used  . Alcohol Use: No  employed at [employer]  Review of Systems  All other systems reviewed and are negative.     Allergies  Tramadol and Fish allergy  Home Medications   Prior to Admission medications   Medication Sig Start Date End Date Taking? Authorizing Provider  acetaminophen  (TYLENOL) 500 MG tablet Take 1,000 mg by mouth every 6 (six) hours as needed for moderate pain.   Yes Historical Provider, MD  Aspirin-Salicylamide-Caffeine (BC HEADACHE POWDER PO) Take 1 packet by mouth as needed (pain or headache).   Yes Historical Provider, MD  Multiple Vitamin (MULTIVITAMIN WITH MINERALS) TABS tablet Take 1 tablet by mouth daily.   Yes Historical Provider, MD  HYDROcodone-acetaminophen (NORCO/VICODIN) 5-325 MG per tablet Take 1 tablet by mouth every 4 (four) hours as needed for moderate pain or severe pain. Patient not taking: Reported on 10/02/2014 06/29/14   Tatyana Kirichenko, PA-C  ibuprofen (ADVIL,MOTRIN) 600 MG tablet Take 1 tablet (600 mg total) by mouth every 6 (six) hours as needed. Patient not taking: Reported on 10/02/2014 06/29/14   Tatyana Kirichenko, PA-C  naproxen (NAPROSYN) 500 MG tablet Take 1 tablet (500 mg total) by mouth 2 (two) times daily. Patient not taking: Reported on 01/04/2015 12/16/14   Antony Madura, PA-C  ondansetron (ZOFRAN) 4 MG tablet Take 1 tablet (4 mg total) by mouth every 6 (six) hours. 01/04/15   Devoria Albe, MD   BP 122/57 mmHg  Pulse 86  Temp(Src) 98.1 F (36.7 C) (Oral)  Resp 19  Ht  (1.702 m)  Wt 185 lb (83.915 kg)  BMI 28.97 kg/m2  SpO2 96%  Vital signs normal   Physical Exam  Constitutional: He is oriented to person, place, and time. He appears well-developed and well-nourished.  Non-toxic appearance. He does not appear ill. He appears distressed.  Dramatic, constantly emphasizing how he aches all over  HENT:  Head: Normocephalic and atraumatic.  Right Ear: External ear normal.  Left Ear: External ear normal.  Nose: Nose normal. No mucosal edema or rhinorrhea.  Mouth/Throat: Oropharynx is clear and moist and mucous membranes are normal. No dental abscesses or uvula swelling.  Mildly dry mucus membranes  Eyes: Conjunctivae and EOM are normal. Pupils are equal, round, and reactive to light.  Neck: Normal range of motion and  full passive range of motion without pain. Neck supple.  Cardiovascular: Normal rate, regular rhythm and normal heart sounds.  Exam reveals no gallop and no friction rub.   No murmur heard. Pulmonary/Chest: Effort normal and breath sounds normal. No respiratory distress. He has no wheezes. He has no rhonchi. He has no rales. He exhibits no tenderness and no crepitus.  Abdominal: Soft. Normal appearance and bowel sounds are normal. He exhibits no distension. There is no tenderness. There is no rebound and no guarding.  Musculoskeletal: Normal range of motion. He exhibits no edema or tenderness.  Moves all extremities well.   Neurological: He is alert and oriented to person, place, and time. He has normal strength. No cranial nerve deficit.  Skin: Skin is warm, dry and intact. No rash noted. No erythema. No pallor.  Psychiatric: His speech is normal and behavior is normal. His mood appears anxious.  Nursing note and vitals reviewed.   ED Course  Procedures (including critical care time) Medications  sodium chloride 0.9 % bolus 1,000 mL (1,000 mLs Intravenous New Bag/Given 01/04/15 0611)  sodium chloride 0.9 % bolus 1,000 mL (1,000 mLs Intravenous New Bag/Given 01/04/15 0543)  ketorolac (TORADOL) 30 MG/ML injection 30 mg (30 mg Intravenous Given 01/04/15 0542)  ondansetron (ZOFRAN) injection 4 mg (4 mg Intravenous Given 01/04/15 0542)  potassium chloride SA (K-DUR,KLOR-CON) CR tablet 40 mEq (40 mEq Oral Given 01/04/15 1610)    Patient has a large bag of [employer] with a extra large [employer] drink at his bedside. Patient states he bought it just prior to coming to the ED.  Patient was given IV fluids. He was given Zofran for his nausea. After review of his labs was given oral potassium for his low potassium. Patient is very unhappy getting the Toradol for his body aches and myalgias. He told the nurse before she gave it to him that "it's not going to work". I have discussed with the patient for  generalized body aches he can take Tylenol however I'm not going give him anything stronger for pain for body aches with his stomach problem. At this point patient states he was wanting to leave.  Labs Review Results for orders placed or performed during the hospital encounter of 01/04/15  Basic metabolic panel  Result Value Ref Range   Sodium 140 135 - 145 mmol/L   Potassium 3.3 (L) 3.5 - 5.1 mmol/L   Chloride 109 96 - 112 mmol/L   CO2 25 19 - 32 mmol/L   Glucose, Bld 102 (H) 70 - 99 mg/dL   BUN 16 6 - 23 mg/dL   Creatinine, Ser 9.60 0.50 - 1.35 mg/dL   Calcium 9.0 8.4 - 45.4 mg/dL   GFR calc non Af Amer >90 >90 mL/min   GFR calc Af Amer >90 >90 mL/min   Anion gap 6 5 - 15  CBC with  Differential/Platelet  Result Value Ref Range   WBC 5.9 4.0 - 10.5 K/uL   RBC 4.70 4.22 - 5.81 MIL/uL   Hemoglobin 13.2 13.0 - 17.0 g/dL   HCT 16.139.1 09.639.0 - 04.552.0 %   MCV 83.2 78.0 - 100.0 fL   MCH 28.1 26.0 - 34.0 pg   MCHC 33.8 30.0 - 36.0 g/dL   RDW 40.913.6 81.111.5 - 91.415.5 %   Platelets 238 150 - 400 K/uL   Neutrophils Relative % 70 43 - 77 %   Neutro Abs 4.1 1.7 - 7.7 K/uL   Lymphocytes Relative 18 12 - 46 %   Lymphs Abs 1.1 0.7 - 4.0 K/uL   Monocytes Relative 8 3 - 12 %   Monocytes Absolute 0.5 0.1 - 1.0 K/uL   Eosinophils Relative 4 0 - 5 %   Eosinophils Absolute 0.2 0.0 - 0.7 K/uL   Basophils Relative 0 0 - 1 %   Basophils Absolute 0.0 0.0 - 0.1 K/uL       Imaging Review No results found.   EKG Interpretation None      MDM   Final diagnoses:  Nausea vomiting and diarrhea  Myalgia  Hypokalemia    New Prescriptions   ONDANSETRON (ZOFRAN) 4 MG TABLET    Take 1 tablet (4 mg total) by mouth every 6 (six) hours.    Plan discharge   Devoria AlbeIva Aldred Mase, MD, Concha PyoFACEP     Carling Liberman, MD 01/04/15 [phone]

## 2015-03-04 ENCOUNTER — Emergency Department (HOSPITAL_COMMUNITY)
Admission: EM | Admit: 2015-03-04 | Discharge: 2015-03-05 | Disposition: A | Discharge status: Home / Self Care | Payer: Self-pay | Attending: Emergency Medicine | Admitting: Emergency Medicine

## 2015-03-04 ENCOUNTER — Encounter (HOSPITAL_COMMUNITY): Payer: Self-pay | Admitting: Emergency Medicine

## 2015-03-04 DIAGNOSIS — H538 Other visual disturbances: Secondary | ICD-10-CM | POA: Insufficient documentation

## 2015-03-04 DIAGNOSIS — Z862 Personal history of diseases of the blood and blood-forming organs and certain disorders involving the immune mechanism: Secondary | ICD-10-CM | POA: Insufficient documentation

## 2015-03-04 DIAGNOSIS — R519 Headache, unspecified: Secondary | ICD-10-CM

## 2015-03-04 DIAGNOSIS — Z79899 Other long term (current) drug therapy: Secondary | ICD-10-CM | POA: Insufficient documentation

## 2015-03-04 DIAGNOSIS — Z8739 Personal history of other diseases of the musculoskeletal system and connective tissue: Secondary | ICD-10-CM | POA: Insufficient documentation

## 2015-03-04 DIAGNOSIS — Z7982 Long term (current) use of aspirin: Secondary | ICD-10-CM | POA: Insufficient documentation

## 2015-03-04 DIAGNOSIS — R51 Headache: Secondary | ICD-10-CM | POA: Insufficient documentation

## 2015-03-04 MED ORDER — KETOROLAC TROMETHAMINE 30 MG/ML IJ SOLN
30.0000 mg | Freq: Once | INTRAMUSCULAR | Status: AC
  Administered 2015-03-04: 30 mg via INTRAVENOUS
  Filled 2015-03-04: qty 1

## 2015-03-04 MED ORDER — METOCLOPRAMIDE HCL 10 MG PO TABS
10.0000 mg | ORAL_TABLET | Freq: Once | ORAL | Status: AC
  Administered 2015-03-05: 10 mg via ORAL
  Filled 2015-03-04: qty 1

## 2015-03-04 MED ORDER — IBUPROFEN 200 MG PO TABS
400.0000 mg | ORAL_TABLET | Freq: Once | ORAL | Status: AC
  Administered 2015-03-04: 400 mg via ORAL
  Filled 2015-03-04: qty 2

## 2015-03-04 MED ORDER — DIPHENHYDRAMINE HCL 25 MG PO CAPS
25.0000 mg | ORAL_CAPSULE | Freq: Once | ORAL | Status: AC
  Administered 2015-03-04: 25 mg via ORAL
  Filled 2015-03-04: qty 1

## 2015-03-04 NOTE — ED Notes (Signed)
Provided two cups of water.

## 2015-03-04 NOTE — ED Notes (Addendum)
Pt requesting that we move him to the back before the people in the lobby and states "Ma'am I am really hurting". Pt was informed that we will be moving people as soon as room availability occurs. He ambulated to lobby with steady gait. Pt medicated with Motrin and given one cup of water. Pt alert and oriented.

## 2015-03-04 NOTE — ED Provider Notes (Signed)
CSN: [id number]     Arrival date & time 03/04/15  2109 History   First MD Initiated Contact with Patient 03/04/15 2317     Chief Complaint  Patient presents with  . Headache  . Blurred Vision    HPI   24 year old male presents with headache. Patient reports and Friday he's had slow onset bilateral temporal throbbing headache, with no radiation of pain, no nausea vomiting, no vascular deficits. Patient denies fever, chills, trouble seeing, trauma to the head, neck pain or stiffness or any other concerning signs or symptoms. He reports he tried Tylenol at home with little relief. Patient reports she does not frequently have headaches, and is uncertain as to the etiology. He reports nothing makes the headache better or worse, denies photophobia, phonophobia. Nursing notes report blurred vision, patient reports that he does not have blurred vision only pain in trying to focus on objects up close.  Past Medical History  Diagnosis Date  . Allergic angiitis   . Multiple allergies   . Sickle cell trait    History reviewed. No pertinent past surgical history. Family History  Problem Relation Age of Onset  . CAD Other   . Hypertension Other   . Diabetes Other    History  Substance Use Topics  . Smoking status: Never Smoker   . Smokeless tobacco: Never Used  . Alcohol Use: No    Review of Systems  All other systems reviewed and are negative.   Allergies  Tramadol and Fish allergy  Home Medications   Prior to Admission medications   Medication Sig Start Date End Date Taking? Authorizing Provider  acetaminophen (TYLENOL) 500 MG tablet Take 1,000 mg by mouth every 6 (six) hours as needed for moderate pain.    Historical Provider, MD  Aspirin-Salicylamide-Caffeine (BC HEADACHE POWDER PO) Take 1 packet by mouth as needed (pain or headache).    Historical Provider, MD  HYDROcodone-acetaminophen (NORCO/VICODIN) 5-325 MG per tablet Take 1 tablet by mouth every 4 (four) hours as needed for  moderate pain or severe pain. Patient not taking: Reported on 10/02/2014 06/29/14   Tatyana Kirichenko, PA-C  ibuprofen (ADVIL,MOTRIN) 600 MG tablet Take 1 tablet (600 mg total) by mouth every 6 (six) hours as needed. Patient not taking: Reported on 10/02/2014 06/29/14   Jaynie Crumbleatyana Kirichenko, PA-C  Multiple Vitamin (MULTIVITAMIN WITH MINERALS) TABS tablet Take 1 tablet by mouth daily.    Historical Provider, MD  naproxen (NAPROSYN) 500 MG tablet Take 1 tablet (500 mg total) by mouth 2 (two) times daily. Patient not taking: Reported on 01/04/2015 12/16/14   Antony MaduraKelly Humes, PA-C  ondansetron (ZOFRAN) 4 MG tablet Take 1 tablet (4 mg total) by mouth every 6 (six) hours. Patient not taking: Reported on 03/04/2015 01/04/15   Devoria AlbeIva Knapp, MD  potassium chloride SA (K-DUR,KLOR-CON) 20 MEQ tablet Take 1 tablet (20 mEq total) by mouth 2 (two) times daily. Patient not taking: Reported on 03/04/2015 01/04/15   Devoria AlbeIva Knapp, MD   BP 119/69 mmHg  Pulse 53  Temp(Src) 98.6 F (37 C) (Oral)  Resp 16  SpO2 100%   Physical Exam  Constitutional: He is oriented to person, place, and time. He appears well-developed and well-nourished.  HENT:  Head: Normocephalic and atraumatic.  Eyes: Conjunctivae are normal. Pupils are equal, round, and reactive to light. Right eye exhibits no discharge. Left eye exhibits no discharge. No scleral icterus.  Neck: Normal range of motion. No JVD present. No tracheal deviation present.  Pulmonary/Chest: Effort normal. No stridor.  Neurological: He is alert and oriented to person, place, and time. He has normal strength. No cranial nerve deficit or sensory deficit. He displays a negative Romberg sign. Coordination and gait normal. GCS eye subscore is 4. GCS verbal subscore is 5. GCS motor subscore is 6.  Reflex Scores:      Patellar reflexes are 2+ on the right side and 2+ on the left side. Psychiatric: He has a normal mood and affect. His behavior is normal. Judgment and thought content normal.   Nursing note and vitals reviewed.   ED Course  Procedures (including critical care time) Labs Review Labs Reviewed - No data to display  Imaging Review No results found.   EKG Interpretation None      MDM   Final diagnoses:  Headache, unspecified headache type    Labs: none  Imaging: none  Consults: none  Therapeutics: Toradol, Reglan, Benadryl  Assessment: Headache  Plan: Patient presents with a headache with no fever, neck stiffness, neck pain, focal neurological deficits, this headache started slowly with no associated symptoms.  Toradol Reglan and Benadryl, and oral fluids.  Patient was given the medications, shortly after he requested to speak with me reporting that he had numbness and tingling in his fingers and toes and reports this is a "reaction to the Toradol". I went over his allergies with him as this was not a reported allergy, he reports that last time he was seen he was given that and had the sensation but did not listed as one of his allergies. No visible problems at this fingers and toes, perfusion intact, they were warm to the touch with no signs of swelling or edema. No airway involvement.  Patient was again reassessed and reported that the numbness and tingling in his fingers had improved, minimal headache symptoms at this time. Patient was given a dose of Tylenol.  Patient was requesting work notes as he could not make it into work Friday Saturday or today. I reported that I would be able give him a work note for today reporting he was seen here in the ED he became slightly agitated reporting he need one for Friday to as this was a new job and he has not been there in the last 2 days. I reiterated that I cannot backdate any work notes, he understood and agreed. My evaluation patient was interactive, moving around well he is nontoxic and appeared to be in no acute distress whatsoever.   Patient was given strict return precautions, encouraged to follow up  with his primary care for further evaluation and management. Verbalized his understanding and agreement to today's plan and had no further questions or concerns.      Eyvonne Mechanic, PA-C 03/05/15 1610  Devoria Albe, MD 03/05/15 [phone]

## 2015-03-04 NOTE — ED Notes (Signed)
Pt from c/o left sided headache since Friday and chills. Pt reports blurred vision. He report taking tylenol without relief.  Pupils equal and reactive, equal grips bilaterally, and facial symmetry. He also reports sore throat and a cough recently.

## 2015-03-05 MED ORDER — ACETAMINOPHEN 325 MG PO TABS
650.0000 mg | ORAL_TABLET | Freq: Once | ORAL | Status: AC
  Administered 2015-03-05: 650 mg via ORAL
  Filled 2015-03-05: qty 2

## 2015-03-05 NOTE — ED Notes (Signed)
When entering the room, pt has earphones in his ear and he is watching television. Provided additional cup of water.

## 2015-03-05 NOTE — Discharge Instructions (Signed)
Please monitor for new or worsening signs or symptoms, follow-up immediately if any present. Please contact her primary care provider inform them of your visit today and all relevant data.

## 2015-03-11 ENCOUNTER — Emergency Department (HOSPITAL_COMMUNITY)
Admission: EM | Admit: 2015-03-11 | Discharge: 2015-03-11 | Disposition: A | Discharge status: Home / Self Care | Payer: Self-pay | Attending: Emergency Medicine | Admitting: Emergency Medicine

## 2015-03-11 ENCOUNTER — Emergency Department (HOSPITAL_COMMUNITY): Payer: Self-pay

## 2015-03-11 ENCOUNTER — Encounter (HOSPITAL_COMMUNITY): Payer: Self-pay | Admitting: Emergency Medicine

## 2015-03-11 DIAGNOSIS — L089 Local infection of the skin and subcutaneous tissue, unspecified: Secondary | ICD-10-CM | POA: Insufficient documentation

## 2015-03-11 DIAGNOSIS — W458XXA Other foreign body or object entering through skin, initial encounter: Secondary | ICD-10-CM | POA: Insufficient documentation

## 2015-03-11 DIAGNOSIS — S61031A Puncture wound without foreign body of right thumb without damage to nail, initial encounter: Secondary | ICD-10-CM | POA: Insufficient documentation

## 2015-03-11 DIAGNOSIS — Z862 Personal history of diseases of the blood and blood-forming organs and certain disorders involving the immune mechanism: Secondary | ICD-10-CM | POA: Insufficient documentation

## 2015-03-11 DIAGNOSIS — Y9289 Other specified places as the place of occurrence of the external cause: Secondary | ICD-10-CM | POA: Insufficient documentation

## 2015-03-11 DIAGNOSIS — Z8739 Personal history of other diseases of the musculoskeletal system and connective tissue: Secondary | ICD-10-CM | POA: Insufficient documentation

## 2015-03-11 DIAGNOSIS — Z23 Encounter for immunization: Secondary | ICD-10-CM | POA: Insufficient documentation

## 2015-03-11 DIAGNOSIS — Y998 Other external cause status: Secondary | ICD-10-CM | POA: Insufficient documentation

## 2015-03-11 DIAGNOSIS — Y9389 Activity, other specified: Secondary | ICD-10-CM | POA: Insufficient documentation

## 2015-03-11 DIAGNOSIS — T148XXA Other injury of unspecified body region, initial encounter: Secondary | ICD-10-CM

## 2015-03-11 MED ORDER — IBUPROFEN 800 MG PO TABS
800.0000 mg | ORAL_TABLET | Freq: Once | ORAL | Status: AC
  Administered 2015-03-11: 800 mg via ORAL
  Filled 2015-03-11: qty 1

## 2015-03-11 MED ORDER — CEPHALEXIN 500 MG PO CAPS: 500.0000 mg | ORAL_CAPSULE | Freq: Four times a day (QID) | ORAL | Status: DC

## 2015-03-11 MED ORDER — BUPIVACAINE HCL (PF) 0.5 % IJ SOLN
10.0000 mL | Freq: Once | INTRAMUSCULAR | Status: AC
  Administered 2015-03-11: 10 mL
  Filled 2015-03-11: qty 30

## 2015-03-11 MED ORDER — TETANUS-DIPHTH-ACELL PERTUSSIS 5-2.5-18.5 LF-MCG/0.5 IM SUSP
0.5000 mL | Freq: Once | INTRAMUSCULAR | Status: AC
  Administered 2015-03-11: 0.5 mL via INTRAMUSCULAR
  Filled 2015-03-11: qty 0.5

## 2015-03-11 MED ORDER — NAPROXEN 500 MG PO TABS: 500.0000 mg | ORAL_TABLET | Freq: Two times a day (BID) | ORAL | Status: DC

## 2015-03-11 NOTE — ED Notes (Signed)
Patient transported to X-ray 

## 2015-03-11 NOTE — Discharge Instructions (Signed)
Puncture Wound °A puncture wound is an injury that extends through all layers of the skin and into the tissue beneath the skin (subcutaneous tissue). Puncture wounds become infected easily because germs often enter the body and go beneath the skin during the injury. Having a deep wound with a small entrance point makes it difficult for your caregiver to adequately clean the wound. This is especially true if you have stepped on a nail and it has passed through a dirty shoe or other situations where the wound is obviously contaminated. °CAUSES  °Many puncture wounds involve glass, nails, splinters, fish hooks, or other objects that enter the skin (foreign bodies). A puncture wound may also be caused by a human bite or animal bite. °DIAGNOSIS  °A puncture wound is usually diagnosed by your history and a physical exam. You may need to have an X-ray or an ultrasound to check for any foreign bodies still in the wound. °TREATMENT  °· Your caregiver will clean the wound as thoroughly as possible. Depending on the location of the wound, a bandage (dressing) may be applied. °· Your caregiver might prescribe antibiotic medicines. °· You may need a follow-up visit to check on your wound. Follow all instructions as directed by your caregiver. °HOME CARE INSTRUCTIONS  °· Change your dressing once per day, or as directed by your caregiver. If the dressing sticks, it may be removed by soaking the area in water. °· If your caregiver has given you follow-up instructions, it is very important that you return for a follow-up appointment. Not following up as directed could result in a chronic or permanent injury, pain, and disability. °· Only take over-the-counter or prescription medicines for pain, discomfort, or fever as directed by your caregiver. °· If you are given antibiotics, take them as directed. Finish them even if you start to feel better. °You may need a tetanus shot if: °· You cannot remember when you had your last tetanus  shot. °· You have never had a tetanus shot. °If you got a tetanus shot, your arm may swell, get red, and feel warm to the touch. This is common and not a problem. If you need a tetanus shot and you choose not to have one, there is a rare chance of getting tetanus. Sickness from tetanus can be serious. °You may need a rabies shot if an animal bite caused your puncture wound. °SEEK MEDICAL CARE IF:  °· You have redness, swelling, or increasing pain in the wound. °· You have red streaks going away from the wound. °· You notice a bad smell coming from the wound or dressing. °· You have yellowish-white fluid (pus) coming from the wound. °· You are treated with an antibiotic for infection, but the infection is not getting better. °· You notice something in the wound, such as rubber from your shoe, cloth, or another object. °· You have a fever. °· You have severe pain. °· You have difficulty breathing. °· You feel dizzy or faint. °· You cannot stop vomiting. °· You lose feeling, develop numbness, or cannot move a limb below the wound. °· Your symptoms worsen. °MAKE SURE YOU: °· Understand these instructions. °· Will watch your condition. °· Will get help right away if you are not doing well or get worse. °Document Released: 07/02/2005 Document Revised: 12/15/2011 Document Reviewed: 03/11/2011 °Exit[employer]® Patient Information ©2015 [employer]. This information is not intended to replace advice given to you by your health care provider. Make sure you discuss any questions you   have with your health care provider.  Fingertip Infection When an infection is around the nail, it is called a paronychia. When it appears over the tip of the finger, it is called a felon. These infections are due to minor injuries or cracks in the skin. If they are not treated properly, they can lead to bone infection and permanent damage to the fingernail. Incision and drainage is necessary if a pus pocket (an abscess) has formed. Antibiotics and  pain medicine may also be needed. Keep your hand elevated for the next 2-3 days to reduce swelling and pain. If a pack was placed in the abscess, it should be removed in 1-2 days by your caregiver. Soak the finger in warm water for 20 minutes 4 times daily to help promote drainage. Keep the hands as dry as possible. Wear protective gloves with cotton liners. See your caregiver for follow-up care as recommended.  HOME CARE INSTRUCTIONS   Keep wound clean, dry and dressed as suggested by your caregiver.  Soak in warm salt water for fifteen minutes, four times per day for bacterial infections.  Your caregiver will prescribe an antibiotic if a bacterial infection is suspected. Take antibiotics as directed and finish the prescription, even if the problem appears to be improving before the medicine is gone.  Only take over-the-counter or prescription medicines for pain, discomfort, or fever as directed by your caregiver. SEEK IMMEDIATE MEDICAL CARE IF:  There is redness, swelling, or increasing pain in the wound.  Pus or any other unusual drainage is coming from the wound.  An unexplained oral temperature above 102 F (38.9 C) develops.  You notice a foul smell coming from the wound or dressing. MAKE SURE YOU:   Understand these instructions.  Monitor your condition.  Contact your caregiver if you are getting worse or not improving. Document Released: 10/30/2004 Document Revised: 12/15/2011 Document Reviewed: 10/26/2008 [facility] Patient Information 2015 [place], [place]. This information is not intended to replace advice given to you by your health care provider. Make sure you discuss any questions you have with your health care provider.

## 2015-03-11 NOTE — ED Provider Notes (Signed)
CSN: [id number]     Arrival date & time 03/11/15  1140 History  This chart was scribed for Arthor Captain, PA-C, working with Samuel Jester, DO by Chestine Spore, ED Scribe. The patient was seen in room WTR8/WTR8 at 12:11 PM.    Chief Complaint  Patient presents with  . Finger Injury      The history is provided by the patient. No language interpreter was used.     HPI Comments: Larry Estes is a 24 y.o. male who presents to the Emergency Department complaining of right finger injury onset yesterday. He reports that he was helping his family member move items in a shed when an object punctured his right finger. He is not sure if there is anything in the wound. He states that he is having associated symptoms of right finger swelling and pain, redness, and tingling of the right thumb. He states that he has tried cleaning the wound with no relief for his symptoms. He denies any other symptoms. Pt is not UTD on his tetanus.   Past Medical History  Diagnosis Date  . Allergic angiitis   . Multiple allergies   . Sickle cell trait    History reviewed. No pertinent past surgical history. Family History  Problem Relation Age of Onset  . CAD Other   . Hypertension Other   . Diabetes Other    History  Substance Use Topics  . Smoking status: Never Smoker   . Smokeless tobacco: Never Used  . Alcohol Use: No    Review of Systems  Musculoskeletal: Positive for joint swelling and arthralgias (right finger).  Skin: Positive for color change (redness of right thumb).  Neurological:       Tingling of the right thumb      Allergies  Tramadol; Fish allergy; and Toradol  Home Medications   Prior to Admission medications   Medication Sig Start Date End Date Taking? Authorizing Provider  Aspirin-Salicylamide-Caffeine (BC HEADACHE POWDER PO) Take 1 packet by mouth as needed (for pain or headache).   Yes Historical Provider, MD  HYDROcodone-acetaminophen (NORCO/VICODIN) 5-325 MG per tablet  Take 1 tablet by mouth every 4 (four) hours as needed for moderate pain or severe pain. Patient not taking: Reported on 10/02/2014 06/29/14   Tatyana Kirichenko, PA-C  ibuprofen (ADVIL,MOTRIN) 600 MG tablet Take 1 tablet (600 mg total) by mouth every 6 (six) hours as needed. Patient not taking: Reported on 10/02/2014 06/29/14   Tatyana Kirichenko, PA-C  naproxen (NAPROSYN) 500 MG tablet Take 1 tablet (500 mg total) by mouth 2 (two) times daily. Patient not taking: Reported on 01/04/2015 12/16/14   Antony Madura, PA-C  ondansetron (ZOFRAN) 4 MG tablet Take 1 tablet (4 mg total) by mouth every 6 (six) hours. Patient not taking: Reported on 03/04/2015 01/04/15   Devoria Albe, MD  potassium chloride SA (K-DUR,KLOR-CON) 20 MEQ tablet Take 1 tablet (20 mEq total) by mouth 2 (two) times daily. Patient not taking: Reported on 03/04/2015 01/04/15   Devoria Albe, MD   BP 132/69 mmHg  Pulse 59  Temp(Src) 98.4 F (36.9 C) (Oral)  Resp 20  SpO2 100% Physical Exam  Constitutional: He is oriented to person, place, and time. He appears well-developed and well-nourished. No distress.  HENT:  Head: Normocephalic and atraumatic.  Eyes: EOM are normal.  Neck: Neck supple. No tracheal deviation present.  Cardiovascular: Normal rate.   Pulmonary/Chest: Effort normal. No respiratory distress.  Musculoskeletal:       Right hand: He exhibits decreased  range of motion (due to pain).  Right finger thumb with small area of purulent at the dorsal surface of the PIP joint. Surround erythema. excruciating pain with any flexion of the thumb and passive extension.   Neurological: He is alert and oriented to person, place, and time.  Skin: Skin is warm and dry.  Psychiatric: He has a normal mood and affect. His behavior is normal.  Nursing note and vitals reviewed.   ED Course  INCISION AND DRAINAGE Date/Time: 03/11/2015 9:01 PM Performed by: Arthor CaptainHARRIS, Domenick Quebedeaux Authorized by: Arthor CaptainHARRIS, Elaura Calix Consent: Verbal consent obtained. Risks  and benefits: risks, benefits and alternatives were discussed Patient identity confirmed: provided demographic data Time out: Immediately prior to procedure a "time out" was called to verify the correct patient, procedure, equipment, support staff and site/side marked as required. Type: abscess Body area: upper extremity Location details: right thumb Anesthesia: digital block Local anesthetic: bupivacaine 0.5% without epinephrine Anesthetic total: 10 ml Patient sedated: no Scalpel size: 11 Incision type: elliptical Complexity: simple Drainage: purulent Drainage amount: scant Wound treatment: wound left open   (including critical care time) DIAGNOSTIC STUDIES: Oxygen Saturation is 100% on RA, nl by my interpretation.    COORDINATION OF CARE: 12:14 PM-Discussed treatment plan which includes I&D, abx, and f/u with hand specialist with pt at bedside and pt agreed to plan.   Labs Review Labs Reviewed - No data to display  Imaging Review Dg Finger Thumb Right  03/11/2015   CLINICAL DATA:  Acute right thumb pain and swelling  EXAM: RIGHT THUMB 2+V  COMPARISON:  07/23/2006  FINDINGS: Normal alignment without acute osseous finding or fracture. Preserved joint spaces. No arthropathy. No radiopaque foreign body demonstrated. Mild soft tissue swelling.  IMPRESSION: Mild soft tissue swelling without acute osseous finding or radiopaque foreign body.   Electronically Signed   By: Judie PetitM.  Shick M.D.   On: 03/11/2015 14:20     EKG Interpretation None      MDM   Final diagnoses:  Finger infection  Puncture wound    Negative imaging. Patient  Continues to c/o pain with passive and active ROM, ? Flexor tenosynovitis. F/u with Dr. Izora Ribasoley tomorrow.  Placed on keflex Per Dr. Izora Ribasoley. Naproxen and finger splint  I personally performed the services described in this documentation, which was scribed in my presence. The recorded information has been reviewed and is accurate.      Arthor CaptainAbigail Mattix Imhof,  PA-C 03/11/15 2104  Samuel JesterKathleen McManus, DO 03/15/15 1453

## 2015-03-11 NOTE — ED Notes (Signed)
Pt states that the motrin is not going to help him with pain and that he wants a stronger pain medication. Reported to PA

## 2015-03-11 NOTE — ED Notes (Signed)
Pt refused to stay to apply splint.

## 2015-03-11 NOTE — ED Notes (Signed)
Pt c/o R finger pain and swelling after "moving an object with wood in it yesterday." Pt has mild swelling to thumb, no redness noted. Small area of yellowing around puncture wound. Pt sts he has been unable to get any foreign object out of his thumb. Pt A&Ox4. Pt sts "I can't even feel my finger anymore" but then sts "my pain is a 10/10."  Pt moving and bending thumb at this time.

## 2015-05-01 ENCOUNTER — Emergency Department (HOSPITAL_COMMUNITY)
Admission: EM | Admit: 2015-05-01 | Discharge: 2015-05-01 | Disposition: A | Discharge status: Home / Self Care | Payer: Self-pay | Attending: Emergency Medicine | Admitting: Emergency Medicine

## 2015-05-01 ENCOUNTER — Encounter (HOSPITAL_COMMUNITY): Payer: Self-pay | Admitting: Emergency Medicine

## 2015-05-01 DIAGNOSIS — Z792 Long term (current) use of antibiotics: Secondary | ICD-10-CM | POA: Insufficient documentation

## 2015-05-01 DIAGNOSIS — Y998 Other external cause status: Secondary | ICD-10-CM | POA: Insufficient documentation

## 2015-05-01 DIAGNOSIS — Y288XXA Contact with other sharp object, undetermined intent, initial encounter: Secondary | ICD-10-CM | POA: Insufficient documentation

## 2015-05-01 DIAGNOSIS — Y9389 Activity, other specified: Secondary | ICD-10-CM | POA: Insufficient documentation

## 2015-05-01 DIAGNOSIS — Z862 Personal history of diseases of the blood and blood-forming organs and certain disorders involving the immune mechanism: Secondary | ICD-10-CM | POA: Insufficient documentation

## 2015-05-01 DIAGNOSIS — Z791 Long term (current) use of non-steroidal anti-inflammatories (NSAID): Secondary | ICD-10-CM | POA: Insufficient documentation

## 2015-05-01 DIAGNOSIS — S060X0A Concussion without loss of consciousness, initial encounter: Secondary | ICD-10-CM | POA: Insufficient documentation

## 2015-05-01 DIAGNOSIS — Z7982 Long term (current) use of aspirin: Secondary | ICD-10-CM | POA: Insufficient documentation

## 2015-05-01 DIAGNOSIS — S0191XA Laceration without foreign body of unspecified part of head, initial encounter: Secondary | ICD-10-CM

## 2015-05-01 DIAGNOSIS — S0101XA Laceration without foreign body of scalp, initial encounter: Secondary | ICD-10-CM | POA: Insufficient documentation

## 2015-05-01 DIAGNOSIS — S0990XA Unspecified injury of head, initial encounter: Secondary | ICD-10-CM

## 2015-05-01 DIAGNOSIS — Y9289 Other specified places as the place of occurrence of the external cause: Secondary | ICD-10-CM | POA: Insufficient documentation

## 2015-05-01 MED ORDER — TETANUS-DIPHTH-ACELL PERTUSSIS 5-2.5-18.5 LF-MCG/0.5 IM SUSP
0.5000 mL | Freq: Once | INTRAMUSCULAR | Status: AC
  Administered 2015-05-01: 0.5 mL via INTRAMUSCULAR
  Filled 2015-05-01: qty 0.5

## 2015-05-01 MED ORDER — ACETAMINOPHEN 500 MG PO TABS: 1000.0000 mg | ORAL_TABLET | Freq: Once | ORAL | Status: DC

## 2015-05-01 MED ORDER — HYDROCODONE-ACETAMINOPHEN 5-325 MG PO TABS
2.0000 | ORAL_TABLET | Freq: Once | ORAL | Status: AC
Start: 2015-05-01 — End: 2015-05-01
  Administered 2015-05-01: 2 via ORAL
  Filled 2015-05-01: qty 2

## 2015-05-01 NOTE — ED Provider Notes (Signed)
CSN: [id number]     Arrival date & time 05/01/15  0630 History   First MD Initiated Contact with Patient 05/01/15 [phone]     Chief Complaint  Patient presents with  . Head Laceration    right     (Consider location/radiation/quality/duration/timing/severity/associated sxs/prior Treatment) HPI Comments: 24 year old male with no significant medical history presents with right scalp tenderness and small laceration since head injury early this morning at 5:00. Patient walked in to a metal bar. No loss of consciousness, mild nausea no vomiting. No blood thinners, no history of CNS issues or bleeding disorders. Tender to palpation.  Patient is a 24 y.o. male presenting with scalp laceration. The history is provided by the patient.  Head Laceration Associated symptoms include headaches. Pertinent negatives include no chest pain, no abdominal pain and no shortness of breath.    Past Medical History  Diagnosis Date  . Allergic angiitis   . Multiple allergies   . Sickle cell trait    History reviewed. No pertinent past surgical history. Family History  Problem Relation Age of Onset  . CAD Other   . Hypertension Other   . Diabetes Other    History  Substance Use Topics  . Smoking status: Never Smoker   . Smokeless tobacco: Never Used  . Alcohol Use: No    Review of Systems  Constitutional: Negative for fever and chills.  HENT: Negative for congestion.   Eyes: Negative for visual disturbance.  Respiratory: Negative for shortness of breath.   Cardiovascular: Negative for chest pain.  Gastrointestinal: Negative for vomiting and abdominal pain.  Genitourinary: Negative for dysuria and flank pain.  Musculoskeletal: Negative for back pain, neck pain and neck stiffness.  Skin: Positive for wound. Negative for rash.  Neurological: Positive for light-headedness and headaches.      Allergies  Tramadol; Fish allergy; and Toradol  Home Medications   Prior to Admission medications    Medication Sig Start Date End Date Taking? Authorizing Provider  Aspirin-Salicylamide-Caffeine (BC HEADACHE POWDER PO) Take 1 packet by mouth as needed (for pain or headache).    Historical Provider, MD  cephALEXin (KEFLEX) 500 MG capsule Take 1 capsule (500 mg total) by mouth 4 (four) times daily. 03/11/15   Arthor Captain, PA-C  naproxen (NAPROSYN) 500 MG tablet Take 1 tablet (500 mg total) by mouth 2 (two) times daily. 03/11/15   Abigail Harris, PA-C   BP 136/80 mmHg  Pulse 79  Temp(Src) 97.9 F (36.6 C) (Oral)  Resp 17  Ht 5\' 7"  (1.702 m)  Wt 180 lb (81.647 kg)  BMI 28.19 kg/m2  SpO2 100% Physical Exam  Constitutional: He is oriented to person, place, and time. He appears well-developed and well-nourished.  HENT:  Head: Normocephalic.  1 cm laceration right parietal scalp bleeding controlled no significant hematoma no step-off neck supple full range of motion  Eyes: Conjunctivae are normal. Right eye exhibits no discharge. Left eye exhibits no discharge.  Neck: Normal range of motion. Neck supple. No tracheal deviation present.  Cardiovascular: Normal rate and regular rhythm.   Pulmonary/Chest: Effort normal and breath sounds normal.  Abdominal: Soft. He exhibits no distension. There is no tenderness. There is no guarding.  Musculoskeletal: He exhibits no edema.  Neurological: He is alert and oriented to person, place, and time. No cranial nerve deficit. GCS eye subscore is 4. GCS verbal subscore is 5. GCS motor subscore is 6.  5+ strength in UE and LE with f/e at major joints. Sensation to palpation intact  in UE and LE. CNs 2-12 grossly intact.  EOMFI.  PERRL.   Finger nose and coordination intact bilateral.   Visual fields intact to finger testing.   Skin: Skin is warm. No rash noted.  Psychiatric: He has a normal mood and affect.  Nursing note and vitals reviewed.   ED Course  Procedures (including critical care time) Labs Review Labs Reviewed - No data to  display  Imaging Review No results found.   EKG Interpretation None      MDM   Final diagnoses:  Acute head injury, initial encounter  Concussion, without loss of consciousness, initial encounter  Laceration of head, initial encounter   Healthy patient presents with low risk head injury, mild concussion clinically. Pain medicines ordered. Discussed supportive care and reasons to return. No indication for CT scan.  Results and differential diagnosis were discussed with the patient/parent/guardian. Xrays were independently reviewed by myself.  Close follow up outpatient was discussed, comfortable with the plan.   Medications  Tdap (BOOSTRIX) injection 0.5 mL (0.5 mLs Intramuscular Given 05/01/15 0754)  HYDROcodone-acetaminophen (NORCO/VICODIN) 5-325 MG per tablet 2 tablet (2 tablets Oral Given 05/01/15 0754)    Filed Vitals:   05/01/15 0641  BP: 136/80  Pulse: 79  Temp: 97.9 F (36.6 C)  TempSrc: Oral  Resp: 17  Height:  (1.702 m)  Weight: 180 lb (81.647 kg)  SpO2: 100%    Final diagnoses:  Acute head injury, initial encounter  Concussion, without loss of consciousness, initial encounter  Laceration of head, initial encounter       Blane Ohara, MD 05/01/15 [phone]

## 2015-05-01 NOTE — ED Notes (Addendum)
Patient coming from work where he hit the right side of his head against a metal bar.  Scant bleeding in the hair.  Patient states he has been feeling light headed, with throbbing in the head and feeling off balanced ongoing since hitting his head this am.  Patient is alert and oriented.

## 2015-05-01 NOTE — Discharge Instructions (Signed)
Return for persistent vomiting, confusion, neurologic symptoms or other concerns. Keep wound clean and watch for signs of infection take Tylenol and Motrin for pain.  If you were given medicines take as directed.  If you are on coumadin or contraceptives realize their levels and effectiveness is altered by many different medicines.  If you have any reaction (rash, tongues swelling, other) to the medicines stop taking and see a physician.    If your blood pressure was elevated in the ER make sure you follow up for management with a primary doctor or return for chest pain, shortness of breath or stroke symptoms.  Please follow up as directed and return to the ER or see a physician for new or worsening symptoms.  Thank you. Filed Vitals:   05/01/15 0641  BP: 136/80  Pulse: 79  Temp: 97.9 F (36.6 C)  TempSrc: Oral  Resp: 17  Height:  (1.702 m)  Weight: 180 lb (81.647 kg)  SpO2: 100%    Concussion A concussion is a brain injury. It is caused by:  A hit to the head.  A quick and sudden movement (jolt) of the head or neck. A concussion is usually not life threatening. Even so, it can cause serious problems. If you had a concussion before, you may have concussion-like problems after a hit to your head. HOME CARE General Instructions  Follow your doctor's directions carefully.  Take medicines only as told by your doctor.  Only take medicines your doctor says are safe.  Do not drink alcohol until your doctor says it is okay. Alcohol and some drugs can slow down healing. They can also put you at risk for further injury.  If you are having trouble remembering things, write them down.  Try to do one thing at a time if you get distracted easily. For example, do not watch TV while making dinner.  Talk to your family members or close friends when making important decisions.  Follow up with your doctor as told.  Watch your symptoms. Tell others to do the same. Serious problems can  sometimes happen after a concussion. Older adults are more likely to have these problems.  Tell your teachers, school nurse, school counselor, coach, Event organiser, or work Production designer, theatre/television/film about your concussion. Tell them about what you can or cannot do. They should watch to see if:  It gets even harder for you to pay attention or concentrate.  It gets even harder for you to remember things or learn new things.  You need more time than normal to finish things.  You become annoyed (irritable) more than before.  You are not able to deal with stress as well.  You have more problems than before.  Rest. Make sure you:  Get plenty of sleep at night.  Go to sleep early.  Go to bed at the same time every day. Try to wake up at the same time.  Rest during the day.  Take naps when you feel tired.  Limit activities where you have to think a lot or concentrate. These include:  Doing homework.  Doing work related to a job.  Watching TV.  Using the computer. Returning To Your Regular Activities Return to your normal activities slowly, not all at once. You must give your body and brain enough time to heal.   Do not play sports or do other athletic activities until your doctor says it is okay.  Ask your doctor when you can drive, ride a bicycle, or  work other Administrator, sports. Never do these things if you feel dizzy.  Ask your doctor about when you can return to work or school. Preventing Another Concussion It is very important to avoid another brain injury, especially before you have healed. In rare cases, another injury can lead to permanent brain damage, brain swelling, or death. The risk of this is greatest during the first 7-10 days after your injury. Avoid injuries by:   Wearing a seat belt when riding in a car.  Not drinking too much alcohol.  Avoiding activities that could lead to a second concussion (such as contact sports).  Wearing a helmet when doing activities  like:  Biking.  Skiing.  Skateboarding.  Skating.  Making your home safer by:  Removing things from the floor or stairways that could make you trip.  Using grab bars in bathrooms and handrails by stairs.  Placing non-slip mats on floors and in bathtubs.  Improve lighting in dark areas. GET HELP IF:  It gets even harder for you to pay attention or concentrate.  It gets even harder for you to remember things or learn new things.  You need more time than normal to finish things.  You become annoyed (irritable) more than before.  You are not able to deal with stress as well.  You have more problems than before.  You have problems keeping your balance.  You are not able to react quickly when you should. Get help if you have any of these problems for more than 2 weeks:   Lasting (chronic) headaches.  Dizziness or trouble balancing.  Feeling sick to your stomach (nausea).  Seeing (vision) problems.  Being affected by noises or light more than normal.  Feeling sad, low, down in the dumps, blue, gloomy, or empty (depressed).  Mood changes (mood swings).  Feeling of fear or nervousness about what may happen (anxiety).  Feeling annoyed.  Memory problems.  Problems concentrating or paying attention.  Sleep problems.  Feeling tired all the time. GET HELP RIGHT AWAY IF:   You have bad headaches or your headaches get worse.  You have weakness (even if it is in one hand, leg, or part of the face).  You have loss of feeling (numbness).  You feel off balance.  You keep throwing up (vomiting).  You feel tired.  One black center of your eye (pupil) is larger than the other.  You twitch or shake violently (convulse).  Your speech is not clear (slurred).  You are more confused, easily angered (agitated), or annoyed than before.  You have more trouble resting than before.  You are unable to recognize people or places.  You have neck pain.  It is  difficult to wake you up.  You have unusual behavior changes.  You pass out (lose consciousness). MAKE SURE YOU:   Understand these instructions.  Will watch your condition.  Will get help right away if you are not doing well or get worse. Document Released: 09/10/2009 Document Revised: 02/06/2014 Document Reviewed: 04/14/2013 [facility] Patient Information 2015 [place], [place]. This information is not intended to replace advice given to you by your health care provider. Make sure you discuss any questions you have with your health care provider.

## 2015-05-01 NOTE — ED Notes (Signed)
Dr Zavitz at bedside  

## 2015-07-29 ENCOUNTER — Emergency Department (HOSPITAL_COMMUNITY): Payer: Self-pay

## 2015-07-29 ENCOUNTER — Emergency Department (HOSPITAL_COMMUNITY)
Admission: EM | Admit: 2015-07-29 | Discharge: 2015-07-30 | Disposition: A | Discharge status: Home / Self Care | Payer: Self-pay | Attending: Emergency Medicine | Admitting: Emergency Medicine

## 2015-07-29 ENCOUNTER — Encounter (HOSPITAL_COMMUNITY): Payer: Self-pay | Admitting: Emergency Medicine

## 2015-07-29 ENCOUNTER — Other Ambulatory Visit: Payer: Self-pay

## 2015-07-29 DIAGNOSIS — Z7982 Long term (current) use of aspirin: Secondary | ICD-10-CM | POA: Insufficient documentation

## 2015-07-29 DIAGNOSIS — F419 Anxiety disorder, unspecified: Secondary | ICD-10-CM | POA: Insufficient documentation

## 2015-07-29 DIAGNOSIS — R002 Palpitations: Secondary | ICD-10-CM

## 2015-07-29 DIAGNOSIS — Z862 Personal history of diseases of the blood and blood-forming organs and certain disorders involving the immune mechanism: Secondary | ICD-10-CM | POA: Insufficient documentation

## 2015-07-29 DIAGNOSIS — J40 Bronchitis, not specified as acute or chronic: Secondary | ICD-10-CM | POA: Insufficient documentation

## 2015-07-29 LAB — BASIC METABOLIC PANEL
ANION GAP: 9 (ref 5–15)
BUN: 9 mg/dL (ref 6–20)
CO2: 25 mmol/L (ref 22–32)
Calcium: 9.4 mg/dL (ref 8.9–10.3)
Chloride: 103 mmol/L (ref 101–111)
Creatinine, Ser: 1 mg/dL (ref 0.61–1.24)
GFR calc Af Amer: >60 mL/min (ref >60)
GLUCOSE: 115 mg/dL — AB (ref 65–99)
POTASSIUM: 3.8 mmol/L (ref 3.5–5.1)
Sodium: 137 mmol/L (ref 135–145)

## 2015-07-29 LAB — CBC
HEMATOCRIT: 39.4 % (ref 39.0–52.0)
Hemoglobin: 13.8 g/dL (ref 13.0–17.0)
MCH: 28.5 pg (ref 26.0–34.0)
MCHC: 35 g/dL (ref 30.0–36.0)
MCV: 81.2 fL (ref 78.0–100.0)
Platelets: 262 10*3/uL (ref 150–400)
RBC: 4.85 MIL/uL (ref 4.22–5.81)
RDW: 12.9 % (ref 11.5–15.5)
WBC: 8.8 10*3/uL (ref 4.0–10.5)

## 2015-07-29 LAB — I-STAT TROPONIN, ED: Troponin i, poc: 0 ng/mL (ref 0.00–0.08)

## 2015-07-29 MED ORDER — GI COCKTAIL ~~LOC~~
30.0000 mL | Freq: Once | ORAL | Status: AC
  Administered 2015-07-29: 30 mL via ORAL
  Filled 2015-07-29: qty 30

## 2015-07-29 MED ORDER — NAPROXEN 500 MG PO TABS: 500.0000 mg | ORAL_TABLET | Freq: Two times a day (BID) | ORAL | Status: DC

## 2015-07-29 MED ORDER — BENZONATATE 100 MG PO CAPS: 100.0000 mg | ORAL_CAPSULE | Freq: Three times a day (TID) | ORAL | Status: DC

## 2015-07-29 MED ORDER — NAPROXEN 250 MG PO TABS
500.0000 mg | ORAL_TABLET | Freq: Once | ORAL | Status: AC
  Administered 2015-07-29: 500 mg via ORAL
  Filled 2015-07-29: qty 2

## 2015-07-29 MED ORDER — BENZONATATE 100 MG PO CAPS
100.0000 mg | ORAL_CAPSULE | Freq: Once | ORAL | Status: AC
  Administered 2015-07-29: 100 mg via ORAL
  Filled 2015-07-29: qty 1

## 2015-07-29 MED ORDER — SODIUM CHLORIDE 0.9 % IV BOLUS (SEPSIS)
1000.0000 mL | Freq: Once | INTRAVENOUS | Status: AC
  Administered 2015-07-29: 1000 mL via INTRAVENOUS

## 2015-07-29 MED ORDER — ACETAMINOPHEN 325 MG PO TABS
650.0000 mg | ORAL_TABLET | Freq: Once | ORAL | Status: AC
Start: 2015-07-29 — End: 2015-07-29
  Administered 2015-07-29: 650 mg via ORAL
  Filled 2015-07-29: qty 2

## 2015-07-29 NOTE — ED Notes (Signed)
Pt c/o generalized body aches, throat pain, and headache. Requesting pain medication, PA aware.

## 2015-07-29 NOTE — ED Notes (Signed)
Pt requesting cough medicine and pain medication for headache; PA made aware

## 2015-07-29 NOTE — ED Notes (Signed)
Per patient been having flu like symptoms with productive cough with green phlegm.  Took theraflu and bc powder today.  Now having racing heart after drinking 2 cups of coffee.  Brought in by ems.

## 2015-07-29 NOTE — Discharge Instructions (Signed)
Please read and follow all provided instructions.  Your diagnoses today include:  1. Bronchitis   2. Palpitations    Tests performed today include:  An EKG of your heart  A chest x-ray - normal  Cardiac enzymes - a blood test for heart muscle damage, normal  Blood counts and electrolytes - normal  Vital signs. See below for your results today.   Medications prescribed:   Tessalon Perles - cough suppressant medication   Naproxen - anti-inflammatory pain medication  Do not exceed 500mg  naproxen every 12 hours, take with food  You have been prescribed an anti-inflammatory medication or NSAID. Take with food. Take smallest effective dose for the shortest duration needed for your pain. Stop taking if you experience stomach pain or vomiting.   Take any prescribed medications only as directed.  Follow-up instructions: Please follow-up with your primary care provider as soon as you can for further evaluation of your symptoms.   Return instructions:  SEEK IMMEDIATE MEDICAL ATTENTION IF:  You have severe chest pain, especially if the pain is crushing or pressure-like and spreads to the arms, back, neck, or jaw, or if you have sweating, nausea (feeling sick to your stomach), or shortness of breath.   You feel dizzy or faint.  You have chest pain not typical of your usual pain for which you originally saw your caregiver.   You have any other emergent concerns regarding your health.  Your vital signs today were: BP 114/54 mmHg   Pulse 61   Temp(Src) 98.5 F (36.9 C) (Oral)   Resp 16   Ht 5\' 7"  (1.702 m)   Wt 185 lb (83.915 kg)   BMI 28.97 kg/m2   SpO2 99% If your blood pressure (BP) was elevated above 135/85 this visit, please have this repeated by your doctor within one month. --------------

## 2015-07-29 NOTE — ED Notes (Signed)
Patient transported to X-ray 

## 2015-07-29 NOTE — ED Provider Notes (Signed)
CSN: [id number]     Arrival date & time 07/29/15  1946 History   First MD Initiated Contact with Patient 07/29/15 2010     Chief Complaint  Patient presents with  . Tachycardia  . Cough     (Consider location/radiation/quality/duration/timing/severity/associated sxs/prior Treatment) HPI Comments: Patient presents with complaint of rapid heartbeat starting tonight after drinking 2 cups of coffee. No associated lightheadedness or chest pains. No syncope. Patient tried to calm himself down but could not, so EMS was called. Patient has also had 7 days of cough, occasionally productive of green sputum. No associated fevers, congestion, ear pain. He has had a sore throat. No nausea, vomiting, or diarrhea. Patient does not smoke, drink alcohol, or use any drugs. He has been using over-the-counter cold and cough medications without much relief. No family history of early or sudden cardiac death. No treatments prior to arrival. The onset of this condition was acute. The course is interminttent. Aggravating factors: none. Alleviating factors: none.    Patient is a 24 y.o. male presenting with cough. The history is provided by the patient.  Cough Associated symptoms: shortness of breath and sore throat   Associated symptoms: no chest pain, no chills, no diaphoresis, no ear pain, no fever, no headaches, no myalgias, no rash, no rhinorrhea and no wheezing     Past Medical History  Diagnosis Date  . Allergic angiitis (HCC)   . Multiple allergies   . Sickle cell trait (HCC)    History reviewed. No pertinent past surgical history. Family History  Problem Relation Age of Onset  . CAD Other   . Hypertension Other   . Diabetes Other    Social History  Substance Use Topics  . Smoking status: Never Smoker   . Smokeless tobacco: Never Used  . Alcohol Use: No    Review of Systems  Constitutional: Negative for fever, chills, diaphoresis and fatigue.  HENT: Positive for sore throat. Negative for  congestion, ear pain, rhinorrhea and sinus pressure.   Eyes: Negative for redness.  Respiratory: Positive for cough and shortness of breath. Negative for wheezing.   Cardiovascular: Negative for chest pain, palpitations and leg swelling.  Gastrointestinal: Negative for nausea, vomiting, abdominal pain and diarrhea.  Genitourinary: Negative for dysuria.  Musculoskeletal: Negative for myalgias, back pain, neck pain and neck stiffness.  Skin: Negative for rash.  Neurological: Negative for syncope, light-headedness and headaches.  Hematological: Negative for adenopathy.  Psychiatric/Behavioral: The patient is nervous/anxious.       Allergies  Tramadol; Fish allergy; and Toradol  Home Medications   Prior to Admission medications   Medication Sig Start Date End Date Taking? Authorizing Provider  Aspirin-Salicylamide-Caffeine (BC HEADACHE POWDER PO) Take 1 packet by mouth as needed (for pain or headache).    Historical Provider, MD  cephALEXin (KEFLEX) 500 MG capsule Take 1 capsule (500 mg total) by mouth 4 (four) times daily. 03/11/15   Arthor CaptainAbigail Harris, PA-C  naproxen (NAPROSYN) 500 MG tablet Take 1 tablet (500 mg total) by mouth 2 (two) times daily. 03/11/15   Abigail Harris, PA-C   BP 142/83 mmHg  Pulse 92  Temp(Src) 98.5 F (36.9 C) (Oral)  Resp 22  Ht 5\' 7"  (1.702 m)  Wt 185 lb (83.915 kg)  BMI 28.97 kg/m2  SpO2 99%   Physical Exam  Constitutional: He appears well-developed and well-nourished.  HENT:  Head: Normocephalic and atraumatic.  Right Ear: Tympanic membrane, external ear and ear canal normal.  Left Ear: Tympanic membrane, external ear and  ear canal normal.  Nose: Nose normal. No mucosal edema or rhinorrhea.  Mouth/Throat: Mucous membranes are normal. Mucous membranes are not dry. Oral lesions (vesicle on uvula) present. No uvula swelling. Posterior oropharyngeal erythema present. No oropharyngeal exudate or posterior oropharyngeal edema.  Eyes: Conjunctivae are normal.  Right eye exhibits no discharge. Left eye exhibits no discharge.  Neck: Trachea normal and normal range of motion. Neck supple. Normal carotid pulses and no JVD present. No muscular tenderness present. Carotid bruit is not present. No tracheal deviation present.  Cardiovascular: Normal rate, regular rhythm, S1 normal, S2 normal, normal heart sounds and intact distal pulses.  Exam reveals no distant heart sounds and no decreased pulses.   No murmur heard. Pulmonary/Chest: Effort normal and breath sounds normal. No respiratory distress. He has no wheezes. He exhibits no tenderness.  Abdominal: Soft. Normal aorta and bowel sounds are normal. There is no tenderness. There is no rebound and no guarding.  Musculoskeletal: He exhibits no edema.  Neurological: He is alert.  Skin: Skin is warm and dry. He is not diaphoretic. No cyanosis. No pallor.  Psychiatric: His mood appears anxious.  Nursing note and vitals reviewed.   ED Course  Procedures (including critical care time) Labs Review Labs Reviewed  BASIC METABOLIC PANEL - Abnormal; Notable for the following:    Glucose, Bld 115 (*)    All other components within normal limits  CBC  RAPID HIV SCREEN (HIV 1/2 AB+AG)  I-STAT TROPOININ, ED    Imaging Review Dg Chest 2 View  07/29/2015  CLINICAL DATA:  Tachycardia. Shortness of breath. Cough, body aches, and itchy throat for 5 days. EXAM: CHEST  2 VIEW COMPARISON:  06/19/2014 FINDINGS: The cardiomediastinal silhouette is within normal limits. The lungs are well inflated, with the patient having taken a greater inspiration than on the prior study. No airspace consolidation, edema, pleural effusion, or pneumothorax is identified. No acute osseous abnormality is seen. IMPRESSION: No active cardiopulmonary disease. Electronically Signed   By: Sebastian Ache M.D.   On: 07/29/2015 21:11   I have personally reviewed and evaluated these images and lab results as part of my medical decision-making.   EKG  Interpretation   Date/Time:  Sunday July 29 2015 20:06:40 EDT Ventricular Rate:  104 PR Interval:  161 QRS Duration: 96 QT Interval:  331 QTC Calculation: 435 R Axis:   72 Text Interpretation:  Sinus tachycardia ST elevation, consider anterior  injury Baseline wander in lead(s) V2 new changes Confirmed by ZACKOWSKI   MD, SCOTT ([id number]) on 07/29/2015 8:22:31 PM      8:38 PM Patient seen and examined. Work-up initiated. Medications ordered. EKG reviewed with Dr. Deretha Emory. Patient is very anxious.   Vital signs reviewed and are as follows: BP 142/83 mmHg  Pulse 92  Temp(Src) 98.5 F (36.9 C) (Oral)  Resp 22  Ht  (1.702 m)  Wt 185 lb (83.915 kg)  BMI 28.97 kg/m2  SpO2 99%  Patient and mother updated on results. Tylenol/naproxen given for headache. GI cocktail given for sore throat.  Encouraged over-the-counter medications for treatment of upper respiratory infection symptoms. Encouraged however to avoid decongestant medications and caffeine as these can cause elevated heart rate.  Patient counseled on supportive care for viral URI and s/s to return including worsening symptoms, persistent fever, persistent vomiting, or if they have any other concerns. Urged to see PCP if symptoms persist for more than 3 days. Patient verbalizes understanding and agrees with plan.   HIV testing performed at  patient request and is negative.   MDM   Final diagnoses:  Bronchitis  Palpitations   Patient with s/s consistent with bronchitis. Chest x-ray is clear. Labs are reassuring. Will treat symptomatically with NSAIDs, Tylenol, Tessalon.  Palpitations are intermittent, and occurred after caffeine ingestion today. Likely combination of caffeine plus over-the-counter cold medications. Patient did not have chest pain, lightheadedness, syncope. No concerning family history. Patient does have some new ST segment changes, likely related to early repolarization. Troponin is negative. Again,  patient has not had any chest pain or symptoms suggestive of ACS, myocarditis, pericarditis, PE. Patient does seem very anxious. Follow-up as above.   No dangerous or life-threatening conditions suspected or identified by history, physical exam, and by work-up. No indications for hospitalization identified.      Renne Crigler, PA-C 07/30/15 0981  Vanetta Mulders, MD 08/03/15 [phone]

## 2015-07-30 LAB — RAPID HIV SCREEN (HIV 1/2 AB+AG)
HIV 1/2 Antibodies: NONREACTIVE
HIV-1 P24 Antigen - HIV24: NONREACTIVE

## 2015-08-06 ENCOUNTER — Encounter (HOSPITAL_COMMUNITY): Payer: Self-pay | Admitting: Family Medicine

## 2015-08-06 ENCOUNTER — Emergency Department (HOSPITAL_COMMUNITY)
Admission: EM | Admit: 2015-08-06 | Discharge: 2015-08-06 | Disposition: A | Discharge status: Home / Self Care | Payer: 59 | Attending: Emergency Medicine | Admitting: Emergency Medicine

## 2015-08-06 DIAGNOSIS — F419 Anxiety disorder, unspecified: Secondary | ICD-10-CM | POA: Insufficient documentation

## 2015-08-06 DIAGNOSIS — Z862 Personal history of diseases of the blood and blood-forming organs and certain disorders involving the immune mechanism: Secondary | ICD-10-CM | POA: Insufficient documentation

## 2015-08-06 HISTORY — DX: Anxiety disorder, unspecified: F41.9

## 2015-08-06 MED ORDER — HYDROXYZINE HCL 10 MG PO TABS
10.0000 mg | ORAL_TABLET | Freq: Once | ORAL | Status: AC
  Administered 2015-08-06: 10 mg via ORAL
  Filled 2015-08-06: qty 1

## 2015-08-06 MED ORDER — ACETAMINOPHEN 325 MG PO TABS
650.0000 mg | ORAL_TABLET | Freq: Once | ORAL | Status: AC
  Administered 2015-08-06: 650 mg via ORAL
  Filled 2015-08-06: qty 2

## 2015-08-06 MED ORDER — HYDROXYZINE HCL 25 MG PO TABS: 25.0000 mg | ORAL_TABLET | Freq: Four times a day (QID) | ORAL | Status: DC | PRN

## 2015-08-06 NOTE — Discharge Instructions (Signed)
Generalized Anxiety Disorder Generalized anxiety disorder (GAD) is a mental disorder. It interferes with life functions, including relationships, work, and school. GAD is different from normal anxiety, which everyone experiences at some point in their lives in response to specific life events and activities. Normal anxiety actually helps us prepare for and get through these life events and activities. Normal anxiety goes away after the event or activity is over.  GAD causes anxiety that is not necessarily related to specific events or activities. It also causes excess anxiety in proportion to specific events or activities. The anxiety associated with GAD is also difficult to control. GAD can vary from mild to severe. People with severe GAD can have intense waves of anxiety with physical symptoms (panic attacks).  SYMPTOMS The anxiety and worry associated with GAD are difficult to control. This anxiety and worry are related to many life events and activities and also occur more days than not for 6 months or longer. People with GAD also have three or more of the following symptoms (one or more in children):  Restlessness.   Fatigue.  Difficulty concentrating.   Irritability.  Muscle tension.  Difficulty sleeping or unsatisfying sleep. DIAGNOSIS GAD is diagnosed through an assessment by your health care provider. Your health care provider will ask you questions aboutyour mood,physical symptoms, and events in your life. Your health care provider may ask you about your medical history and use of alcohol or drugs, including prescription medicines. Your health care provider may also do a physical exam and blood tests. Certain medical conditions and the use of certain substances can cause symptoms similar to those associated with GAD. Your health care provider may refer you to a mental health specialist for further evaluation. TREATMENT The following therapies are usually used to treat GAD:    Medication. Antidepressant medication usually is prescribed for long-term daily control. Antianxiety medicines may be added in severe cases, especially when panic attacks occur.   Talk therapy (psychotherapy). Certain types of talk therapy can be helpful in treating GAD by providing support, education, and guidance. A form of talk therapy called cognitive behavioral therapy can teach you healthy ways to think about and react to daily life events and activities.  Stress managementtechniques. These include yoga, meditation, and exercise and can be very helpful when they are practiced regularly. A mental health specialist can help determine which treatment is best for you. Some people see improvement with one therapy. However, other people require a combination of therapies.   This information is not intended to replace advice given to you by your health care provider. Make sure you discuss any questions you have with your health care provider.   Document Released: 01/17/2013 Document Revised: 10/13/2014 Document Reviewed: 01/17/2013 Elsevier Interactive Patient Education 2016 [employer] Substance Abuse Treatment Programs  Intensive Outpatient Programs [facility]     [address]      [place], [place]                   [phone]       [facility] [address] [place], [place] [phone]  [facility]     (Inpatient and outpatient)     [address]           [phone]    [facility] [phone] (Suboxone and Methadone)  [address]      [place], [place] [address]  [phone]       [address] [place], [place] [phone]  [facility] (Outpatient/Inpatient, Chemical)    (insurance only) [phone]             [facility] (Groups & Residential) [place], [place] [phone]     [facility]     [address]     [place], [place]      [phone]       [facility] (for caregivers and family) [phone] [address] [place], [place] [phone]      Residential Treatment Programs [facility]      [address], [place], [place] [address]  [phone]       [facility] [address] [place], [place] [address] [phone]  [facility] [place]        [phone]       [facility] [phone]  [facility] ([facility]             [address]                                         [place], [place]                                                [phone] or [phone]                               [facility] [address] [place], [address] [phone]  [facility]    [address]      [place], [place]     [phone]       [facility] [address] [place], [place] [phone]  [facility]   [address] [place], [place] [address]     [phone]      Admissions: 8am-3pm M-F  [facility] ([facility]) [address] [place], [place] [phone]  [facility]: Residential Program [phone] Days)   [place], [place]      [phone] or [phone]     [facility]: [facility] [place], [place] (Walk in Hours over the weekend or by referral)  [facility] [address] [place], [place], [place] [address] [phone]  Crisis Mobile: [facility]:  [phone] (for crisis response 24 hours a day) [facility] Hotline:      5876712711 Outpatient Psychiatry and Counseling  [facility]: Mobile Crisis Management 24 hours:  [phone]  [facility] of the [employer] sliding scale fee and walk in schedule: M-F 8am-12pm/1pm-3pm [address]  [place], [place] [address] [phone]  [facility] [address] [place], [place] [address] [phone]  [facility] (Formerly [facility] as [facility] [employer])- new patient walk-in appointments available Monday - Friday 8am -3pm.          [address] [place], [place] [address] [phone] or crisis line- [phone]  [facility]/ Intensive Outpatient Therapy Program [address] [place], [place] [address] [phone]  [facility]                  Crisis Services  [phone]      [address]     [place], [place] [address]                 [facility]   [facility] [phone] [address] [place], [place] [address]   [facility]          [address] [place],  [place], [place] [address]       [phone]  [facility] [address] [place], [place] [address] [phone]  [facility]    [address]    [place], [place] [address]     [phone]       Andee PolesParish McKinney, MD     [address]     [place] [address]     [phone]       [facility] [address] [place] [address]  [facility]     [address]     [place], [place]      [phone]       [facility] Eulogio DitchShamsher Ahluwalia, MD [address] [place], [place] [address] [phone]  [facility]     [address]     [place], [place] [address]     [phone]       [facility] [address] [place], [place] [address] [phone] Resources for Temporary Residential Assistance/Crisis Centers  [facility](IRC) M-F 8am-3pm   [address] [place], [place] [address]   [phone] Services include: laundry, barbering, support groups, case management, phone  & computer access, showers, AA/NA mtgs, mental health/substance abuse nurse, job skills class, disability information, VA assistance,  spiritual classes, etc.   HOMELESS SHELTERS  [facility]     [facility]   [address], [place]     [phone]              [facility] (women and children)       [address] [place], [place] [address] [phone] [email or web address] for application and process Application Required  [facility]   [address]    [place] [address]     [phone]                    [facility] [address] [place], [place] [address] [phone](schedule application appt.) Application Required  [facility] (women only)    [address]     [place], [place] [address]     [phone]      Intake starts 6pm daily Need valid ID, SSC, & Police report [facility] [place] [address] [place], [place] [phone] Application Required  [facility] (men only)     [address]      [place], [place]     [phone]       [facility] (Pregnant women only) [address] [place], [place] [phone]  [facility]      [address]      [place], [place] [address]     [phone]             [facility] [address] [place], [place] [phone] 90 day commitment/SA/Application process  [facility](men only)     [address]     [place], [place]       Check-in at [facility] [address] [place], [place] [address] [phone] Men/Women/Women and Children must be there by 7 pm  [place], [place]

## 2015-08-06 NOTE — ED Provider Notes (Signed)
CSN: [id number]     Arrival date & time 08/06/15  0212 History   First MD Initiated Contact with Patient 08/06/15 0601     Chief Complaint  Patient presents with  . Anxiety   Larry Estes is a 24 y.o. male with a history of anxiety presents to the emergency department complaining of increasing anxiety intermittently for the past 2 weeks. Reports he's been very anxious about the new things in his life including that he just got promoted and having to pay his rent. He reports he has mostly "good anxiety." Mother is at bedside. He reports intermittent waves of feeling very anxious but denies current anxiety during my interview. He reports he was prescribed Xanax by urgent care provider which has been helping intermittently. He reports the medicine wears off and he can get anxious again. He denies suicidal or homicidal ideations. He denies chest pain, shortness of breath, fevers, rashes, abdominal pain, vomiting, diarrhea, or coughing.  (Consider location/radiation/quality/duration/timing/severity/associated sxs/prior Treatment) HPI  Past Medical History  Diagnosis Date  . Allergic angiitis (HCC)   . Multiple allergies   . Sickle cell trait (HCC)   . Anxiety    History reviewed. No pertinent past surgical history. Family History  Problem Relation Age of Onset  . CAD Other   . Hypertension Other   . Diabetes Other    Social History  Substance Use Topics  . Smoking status: Never Smoker   . Smokeless tobacco: Never Used  . Alcohol Use: No    Review of Systems  Constitutional: Negative for fever and chills.  HENT: Negative for congestion and sore throat.   Eyes: Negative for visual disturbance.  Respiratory: Negative for cough and shortness of breath.   Cardiovascular: Negative for chest pain and palpitations.  Gastrointestinal: Negative for vomiting, abdominal pain and diarrhea.  Genitourinary: Negative for dysuria.  Musculoskeletal: Negative for back pain and neck pain.  Skin:  Negative for rash.  Neurological: Negative for headaches.  Psychiatric/Behavioral: Negative for suicidal ideas, hallucinations, sleep disturbance, dysphoric mood and decreased concentration. The patient is nervous/anxious.       Allergies  Tramadol; Fish allergy; and Toradol  Home Medications   Prior to Admission medications   Medication Sig Start Date End Date Taking? Authorizing Provider  naproxen (NAPROSYN) 500 MG tablet Take 1 tablet (500 mg total) by mouth 2 (two) times daily. Patient taking differently: Take 500 mg by mouth 2 (two) times daily as needed (for pain.).  07/29/15  Yes Renne CriglerJoshua Geiple, PA-C  XANAX 0.25 MG tablet Take 0.25 mg by mouth daily as needed. For anxiety. 08/03/15  Yes Historical Provider, MD  benzonatate (TESSALON) 100 MG capsule Take 1 capsule (100 mg total) by mouth every 8 (eight) hours. Patient not taking: Reported on 08/06/2015 07/29/15   Renne CriglerJoshua Geiple, PA-C  cephALEXin (KEFLEX) 500 MG capsule Take 1 capsule (500 mg total) by mouth 4 (four) times daily. Patient not taking: Reported on 08/06/2015 03/11/15   Arthor CaptainAbigail Harris, PA-C  hydrOXYzine (ATARAX/VISTARIL) 25 MG tablet Take 1 tablet (25 mg total) by mouth every 6 (six) hours as needed for anxiety. 08/06/15   Everlene FarrierWilliam Larita Deremer, PA-C   BP 112/53 mmHg  Pulse 49  Temp(Src) 98.1 F (36.7 C) (Oral)  Resp 20  Ht 5\' 7"  (1.702 m)  Wt 180 lb (81.647 kg)  BMI 28.19 kg/m2  SpO2 98% Physical Exam  Constitutional: He is oriented to person, place, and time. He appears well-developed and well-nourished. No distress.  Nontoxic appearing.  HENT:  Head: Normocephalic and atraumatic.  Eyes: Conjunctivae are normal. Pupils are equal, round, and reactive to light. Right eye exhibits no discharge. Left eye exhibits no discharge.  Neck: Neck supple.  Cardiovascular: Normal rate, regular rhythm, normal heart sounds and intact distal pulses.   Pulmonary/Chest: Effort normal and breath sounds normal. No respiratory distress. He  has no wheezes. He has no rales.  Abdominal: Soft. There is no tenderness.  Musculoskeletal: He exhibits no edema.  No lower extremity edema or tenderness.  Lymphadenopathy:    He has no cervical adenopathy.  Neurological: He is alert and oriented to person, place, and time. Coordination normal.  Skin: Skin is warm and dry. No rash noted. He is not diaphoretic. No erythema. No pallor.  Psychiatric: His speech is normal and behavior is normal. His mood appears anxious. He is not actively hallucinating. Thought content is not paranoid. He does not exhibit a depressed mood. He expresses no homicidal and no suicidal ideation.  Patient appears slightly anxious. He makes good eye contact. Mother is at bedside. His speech is clear and coherent. He denies suicidal or homicidal ideations.  Nursing note and vitals reviewed.   ED Course  Procedures (including critical care time) Labs Review Labs Reviewed - No data to display  Imaging Review No results found.    EKG Interpretation None      Filed Vitals:   08/06/15 0237 08/06/15 0525 08/06/15 0545 08/06/15 0600  BP: 124/67 112/53    Pulse: 62 57 51 49  Temp: 98.1 F (36.7 C)     TempSrc: Oral     Resp: 20     Height:  (1.702 m)     Weight: 180 lb (81.647 kg)     SpO2: 98% 81% 99% 98%     MDM   Meds given in ED:  Medications  hydrOXYzine (ATARAX/VISTARIL) tablet 10 mg (not administered)  acetaminophen (TYLENOL) tablet 650 mg (650 mg Oral Given 08/06/15 0630)    New Prescriptions   HYDROXYZINE (ATARAX/VISTARIL) 25 MG TABLET    Take 1 tablet (25 mg total) by mouth every 6 (six) hours as needed for anxiety.    Final diagnoses:  Anxiety   This is a 24 y.o. male with a history of anxiety presents to the emergency department complaining of increasing anxiety intermittently for the past 2 weeks. Reports he's been very anxious about the new things in his life including that he just got promoted and having to pay his rent. He  reports he has mostly "good anxiety." Mother is at bedside. He reports intermittent waves of feeling very anxious but denies current anxiety during my interview. On exam the patient is afebrile and nontoxic appearing. He does appear slightly anxious but denies current anxiety. He does report he has a mild headache and a medicine for his headache. He denies suicidal or homicidal ideations. Will provide with Tylenol and 10 mg of Vistaril and discharged with prescription for Vistaril 25 mg as anxiety. I encouraged the patient to obtain a therapist and psychiatrist and follow-up with behavioral health resources provided. I advised the patient to follow-up with their primary care provider this week. I advised the patient to return to the emergency department with new or worsening symptoms or new concerns. The patient verbalized understanding and agreement with plan.       Everlene Farrier, PA-C 08/06/15 1610  Devoria Albe, MD 08/06/15 [phone]

## 2015-08-06 NOTE — ED Notes (Signed)
Pt here with complaints of anxiety. He took has taken a total of 2 xanax pills tonight as noted in previous note. Pt appears calm and in no distress. Pt states he has recently been promoted and is in the process of getting a new car. Pt states he has some "good anxiety". He also states he has neglected to take time for himself recently and participate in activities he enjoys such as playing video games. The pt reports having a strong support network and this is evidenced by his mother's presence at his bedside. Pt reports having a headache at the current moment, but is otherwise pain free.

## 2015-08-06 NOTE — ED Notes (Signed)
Pt given discharged orders, blood pressure rechecked, instructed to please get dressed.

## 2015-08-06 NOTE — ED Notes (Signed)
Patient took Xanax at 21:00 last night. At 12:30, pt woke up anxious. EMS was notified. Initial   BP: 170/100 HR 123. Before leaving his apartment, pt took another Xanax dose. Pt ambulated to waiting room and appears in no distress.

## 2015-08-08 ENCOUNTER — Encounter (HOSPITAL_COMMUNITY): Payer: Self-pay | Admitting: Emergency Medicine

## 2015-08-08 ENCOUNTER — Emergency Department (HOSPITAL_COMMUNITY): Payer: 59

## 2015-08-08 ENCOUNTER — Emergency Department (HOSPITAL_COMMUNITY)
Admission: EM | Admit: 2015-08-08 | Discharge: 2015-08-08 | Disposition: A | Discharge status: Home / Self Care | Payer: 59 | Attending: Emergency Medicine | Admitting: Emergency Medicine

## 2015-08-08 DIAGNOSIS — R253 Fasciculation: Secondary | ICD-10-CM | POA: Diagnosis not present

## 2015-08-08 DIAGNOSIS — Z79899 Other long term (current) drug therapy: Secondary | ICD-10-CM | POA: Diagnosis not present

## 2015-08-08 DIAGNOSIS — R079 Chest pain, unspecified: Secondary | ICD-10-CM | POA: Insufficient documentation

## 2015-08-08 DIAGNOSIS — R51 Headache: Secondary | ICD-10-CM | POA: Diagnosis not present

## 2015-08-08 DIAGNOSIS — Z862 Personal history of diseases of the blood and blood-forming organs and certain disorders involving the immune mechanism: Secondary | ICD-10-CM | POA: Diagnosis not present

## 2015-08-08 DIAGNOSIS — Z8739 Personal history of other diseases of the musculoskeletal system and connective tissue: Secondary | ICD-10-CM | POA: Diagnosis not present

## 2015-08-08 DIAGNOSIS — R002 Palpitations: Secondary | ICD-10-CM | POA: Diagnosis not present

## 2015-08-08 DIAGNOSIS — F419 Anxiety disorder, unspecified: Secondary | ICD-10-CM | POA: Insufficient documentation

## 2015-08-08 LAB — BASIC METABOLIC PANEL
ANION GAP: 6 (ref 5–15)
BUN: 6 mg/dL (ref 6–20)
CHLORIDE: 104 mmol/L (ref 101–111)
CO2: 28 mmol/L (ref 22–32)
CREATININE: 1.09 mg/dL (ref 0.61–1.24)
Calcium: 9.5 mg/dL (ref 8.9–10.3)
GFR calc non Af Amer: >60 mL/min (ref >60)
Glucose, Bld: 104 mg/dL — ABNORMAL HIGH (ref 65–99)
POTASSIUM: 4 mmol/L (ref 3.5–5.1)
SODIUM: 138 mmol/L (ref 135–145)

## 2015-08-08 LAB — I-STAT TROPONIN, ED: Troponin i, poc: 0.01 ng/mL (ref 0.00–0.08)

## 2015-08-08 LAB — CBC
HCT: 40.9 % (ref 39.0–52.0)
HEMOGLOBIN: 14.2 g/dL (ref 13.0–17.0)
MCH: 28.6 pg (ref 26.0–34.0)
MCHC: 34.7 g/dL (ref 30.0–36.0)
MCV: 82.3 fL (ref 78.0–100.0)
PLATELETS: 258 10*3/uL (ref 150–400)
RBC: 4.97 MIL/uL (ref 4.22–5.81)
RDW: 13.2 % (ref 11.5–15.5)
WBC: 4.6 10*3/uL (ref 4.0–10.5)

## 2015-08-08 MED ORDER — LORAZEPAM 2 MG/ML IJ SOLN
1.0000 mg | Freq: Once | INTRAMUSCULAR | Status: AC
  Administered 2015-08-08: 1 mg via INTRAVENOUS
  Filled 2015-08-08: qty 1

## 2015-08-08 MED ORDER — LORAZEPAM 1 MG PO TABS: 2.0000 mg | ORAL_TABLET | Freq: Once | ORAL | Status: DC

## 2015-08-08 MED ORDER — PROMETHAZINE HCL 25 MG/ML IJ SOLN
25.0000 mg | Freq: Once | INTRAMUSCULAR | Status: AC
  Administered 2015-08-08: 25 mg via INTRAVENOUS
  Filled 2015-08-08: qty 1

## 2015-08-08 MED ORDER — IBUPROFEN 200 MG PO TABS
600.0000 mg | ORAL_TABLET | Freq: Once | ORAL | Status: AC
  Administered 2015-08-08: 600 mg via ORAL
  Filled 2015-08-08: qty 3

## 2015-08-08 MED ORDER — IBUPROFEN 200 MG PO TABS: 600.0000 mg | ORAL_TABLET | Freq: Once | ORAL | Status: DC

## 2015-08-08 NOTE — ED Notes (Signed)
Security at bedside

## 2015-08-08 NOTE — ED Provider Notes (Signed)
CSN: [id number]     Arrival date & time 08/08/15  2038 History   First MD Initiated Contact with Patient 08/08/15 2205     Chief Complaint  Patient presents with  . Chest Pain    The paitent said he has been having chest pain that started earlier today but says he was seen here for palpitations.  The patient denies any other symptoms.     (Consider location/radiation/quality/duration/timing/severity/associated sxs/prior Treatment) HPI 24 year old male with history of anxiety and sickle cell trait who presents with chest pain. States that for the past 3 weeks he has had increasing responsibilities at work, and has had some increased stress. Has been having episodes of anxiety for the past 3 weeks, associated with palpitations, twitching of his facial muscles, sensation of pulsations over his extremities, and headache. He was seen in urgent care for this, and was given Xanax, which she states animated symptoms go away. However, now he has ran out of Xanax, and having recurrent episodes. Tonight, while getting ready for work he had an episode of palpitations, headache, twitching over his face, pulsations in his left upper and lower extremities, and chest pain. Described as pressure over the left side of his chest and radiated towards the back. This is the first time that he developed chest pain with these episodes, so came to the ED for evaluation. Denies any recent illnesses, cough, fevers, nausea or vomiting, diaphoresis, difficulty breathing, lower extremity swelling or pain, numbness or weakness, vision changes, or speech changes. States that he exercises routinely, and has never had these symptoms while exercising. No major family history of early heart disease, arrhythmia, or thromboembolic disease.   Past Medical History  Diagnosis Date  . Allergic angiitis (HCC)   . Multiple allergies   . Sickle cell trait (HCC)   . Anxiety    History reviewed. No pertinent past surgical history. Family  History  Problem Relation Age of Onset  . CAD Other   . Hypertension Other   . Diabetes Other    Social History  Substance Use Topics  . Smoking status: Never Smoker   . Smokeless tobacco: Never Used  . Alcohol Use: No    Review of Systems 10/14 systems reviewed and are negative other than those stated in the HPI    Allergies  Tramadol; Fish allergy; and Toradol  Home Medications   Prior to Admission medications   Medication Sig Start Date End Date Taking? Authorizing Provider  acetaminophen (TYLENOL) 500 MG tablet Take 500 mg by mouth every 6 (six) hours as needed for mild pain or moderate pain.   Yes Historical Provider, MD  hydrOXYzine (ATARAX/VISTARIL) 25 MG tablet Take 1 tablet (25 mg total) by mouth every 6 (six) hours as needed for anxiety. 08/06/15  Yes Everlene FarrierWilliam Dansie, PA-C  benzonatate (TESSALON) 100 MG capsule Take 1 capsule (100 mg total) by mouth every 8 (eight) hours. Patient not taking: Reported on 08/06/2015 07/29/15   Renne CriglerJoshua Geiple, PA-C  cephALEXin (KEFLEX) 500 MG capsule Take 1 capsule (500 mg total) by mouth 4 (four) times daily. Patient not taking: Reported on 08/06/2015 03/11/15   Arthor CaptainAbigail Harris, PA-C  naproxen (NAPROSYN) 500 MG tablet Take 1 tablet (500 mg total) by mouth 2 (two) times daily. Patient not taking: Reported on 08/08/2015 07/29/15   Renne CriglerJoshua Geiple, PA-C   BP 127/83 mmHg  Pulse 58  Temp(Src) 98 F (36.7 C) (Oral)  Resp 16  Ht 5\' 7"  (1.702 m)  SpO2 100% Physical Exam Physical Exam  Nursing note and vitals reviewed. Constitutional: Well developed, well nourished, non-toxic, and in no acute distress Head: Normocephalic and atraumatic.  Mouth/Throat: Oropharynx is clear and moist.  Neck: Normal range of motion. Neck supple.  Cardiovascular: Normal rate and regular rhythm.   Pulmonary/Chest: Effort normal and breath sounds normal.  Abdominal: Soft. There is no tenderness. There is no rebound and no guarding.  Musculoskeletal: Normal range of  motion.  Neurological: Alert, no facial droop, fluent speech, moves all extremities symmetrically Skin: Skin is warm and dry.  Psychiatric: Cooperative  ED Course  Procedures (including critical care time) Labs Review Labs Reviewed  BASIC METABOLIC PANEL - Abnormal; Notable for the following:    Glucose, Bld 104 (*)    All other components within normal limits  CBC  I-STAT TROPOININ, ED    Imaging Review Dg Chest 2 View  08/08/2015  CLINICAL DATA:  Left mid chest pain tonight with palpitations. EXAM: CHEST  2 VIEW COMPARISON:  07/29/2015 FINDINGS: Mild hyperinflation. Normal heart size and pulmonary vascularity. No focal airspace disease or consolidation in the lungs. No blunting of costophrenic angles. No pneumothorax. Mediastinal contours appear intact. IMPRESSION: No active cardiopulmonary disease. Electronically Signed   By: Burman Nieves M.D.   On: 08/08/2015 22:49   I have personally reviewed and evaluated these images and lab results as part of my medical decision-making.   EKG Interpretation   Date/Time:  Wednesday August 08 2015 20:45:28 EDT Ventricular Rate:  68 PR Interval:  152 QRS Duration: 94 QT Interval:  366 QTC Calculation: 389 R Axis:   84 Text Interpretation:  Normal sinus rhythm Normal ECG No significant change  since last tracing Confirmed by Roben Tatsch MD, Errin Whitelaw ([id number]) on 08/08/2015 10:17:45  PM      MDM   Final diagnoses:  Palpitations  Chest pain, unspecified chest pain type  Anxiety    24 year old male who presents with chest pressure in setting of anxiety. Well-appearing in no acute distress on presentation. Vital signs are non-concerning. EKG showing normal sinus rhythm, no ischemia, and no stigmata of arrhythmia. Basic blood work is unremarkable and chest x-ray shows no acute cardiopulmonary processes. I doubt that he has a serious cardiopulmonary etiology of his symptoms. given his increased stressors, and that symptoms typically present when he  is thinking or getting ready for work, this seems most consistent with anxiety. History not suggestive of serious underlying cardiopulmonary processes. Given a dose of Ativan and ibuprofen for symptoms, with resolution and improvement. Have provided this patient with resources for outpatient follow-up with the primary care provider. Discussed behavioral modifications in the setting of anxiety and attack, and discussed that he will need to follow up with a primary care provider to discuss need for anti-anxiety medications as needed. Felt appropriately stabilized for discharge home. Strict return and follow-up instructions reviewed. He expressed understanding of all discharge instructions for comfortable to plan of care.    Lavera Guise, MD 08/09/15 [phone]

## 2015-08-08 NOTE — ED Notes (Signed)
Pt from home for eval of ongoing HA, muscle twitching in face, chest palpitations and cp. [facility] xanax that seemed to help but pt reports cp started today. Pt denies any sob/n/v/d pr fevers. nad noted. Skin warm and dry.

## 2015-08-08 NOTE — Discharge Instructions (Signed)
Nonspecific Chest Pain It is often hard to find the cause of chest pain. There is always a chance that your pain could be related to something serious, such as a heart attack or a blood clot in your lungs. Chest pain can also be caused by conditions that are not life-threatening. If you have chest pain, it is very important to follow up with your doctor.  HOME CARE  If you were prescribed an antibiotic medicine, finish it all even if you start to feel better.  Avoid any activities that cause chest pain.  Do not use any tobacco products, including cigarettes, chewing tobacco, or electronic cigarettes. If you need help quitting, ask your doctor.  Do not drink alcohol.  Take medicines only as told by your doctor.  Keep all follow-up visits as told by your doctor. This is important. This includes any further testing if your chest pain does not go away.  Your doctor may tell you to keep your head raised (elevated) while you sleep.  Make lifestyle changes as told by your doctor. These may include:  Getting regular exercise. Ask your doctor to suggest some activities that are safe for you.  Eating a heart-healthy diet. Your doctor or a diet specialist (dietitian) can help you to learn healthy eating options.  Maintaining a healthy weight.  Managing diabetes, if necessary.  Reducing stress. GET HELP IF:  Your chest pain does not go away, even after treatment.  You have a rash with blisters on your chest.  You have a fever. GET HELP RIGHT AWAY IF:  Your chest pain is worse.  You have an increasing cough, or you cough up blood.  You have severe belly (abdominal) pain.  You feel extremely weak.  You pass out (faint).  You have chills.  You have sudden, unexplained chest discomfort.  You have sudden, unexplained discomfort in your arms, back, neck, or jaw.  You have shortness of breath at any time.  You suddenly start to sweat, or your skin gets clammy.  You feel  nauseous.  You vomit.  You suddenly feel light-headed or dizzy.  Your heart begins to beat quickly, or it feels like it is skipping beats. These symptoms may be an emergency. Do not wait to see if the symptoms will go away. Get medical help right away. Call your local emergency services (911 in the [place] Do not drive yourself to the hospital.   This information is not intended to replace advice given to you by your health care provider. Make sure you discuss any questions you have with your health care provider.   Document Released: 03/10/2008 Document Revised: 10/13/2014 Document Reviewed: 04/28/2014 Elsevier Interactive Patient Education 2016 [employer]   Emergency Department Resource Guide 1) Find a Doctor and Pay Out of Pocket Although you won't have to find out who is covered by your insurance plan, it is a good idea to ask around and get recommendations. You will then need to call the office and see if the doctor you have chosen will accept you as a new patient and what types of options they offer for patients who are self-pay. Some doctors offer discounts or will set up payment plans for their patients who do not have insurance, but you will need to ask so you aren't surprised when you get to your appointment.  2) Contact Your Local Health Department Not all health departments have doctors that can see patients for sick visits, but many do, so it is worth  a call to see if yours does. If you don't know where your local health department is, you can check in your phone book. The CDC also has a tool to help you locate your state's health department, and many state websites also have listings of all of their local health departments.  3) Find a Walk-in Clinic If your illness is not likely to be very severe or complicated, you may want to try a walk in clinic. These are popping up all over the country in pharmacies, drugstores, and shopping centers. They're usually staffed by nurse  practitioners or physician assistants that have been trained to treat common illnesses and complaints. They're usually fairly quick and inexpensive. However, if you have serious medical issues or chronic medical problems, these are probably not your best option.  No Primary Care Doctor: - Call [facility] at  [phone] - they can help you locate a primary care doctor that  accepts your insurance, provides certain services, etc. - Physician Referral Service- [phone]

## 2015-08-08 NOTE — ED Notes (Signed)
Pt again instructed to dress to be discharged.

## 2015-08-08 NOTE — ED Notes (Signed)
Pt wheeled out and escorted by security.

## 2015-08-08 NOTE — ED Notes (Signed)
Pt wheeled to and from restroom by this RN

## 2015-08-08 NOTE — ED Notes (Signed)
Pt instructed to begin dressing in preparation for discharge

## 2015-08-08 NOTE — ED Notes (Signed)
Pt found standing next to bed still in gown, visitor in pt bed. Pt again instructed to dress for discharge.

## 2015-08-08 NOTE — ED Notes (Signed)
Pt again found standing next to bed in gown eating crackers. Visitor still in pt bed. Pt again instructed to dress. Pt asked what medications he had been given, this RN showed pt in discharge paper work what medications he had been given. Pt again instructed to dress for discharge. Pt visitor begins yelling at this RN about the pt needing to be dressed. This RN advised that security may have to escort the pt out if he will not comply. Pt and visitor begin yelling at this RN, this RN asked that they stop yelling. Pt and visitor continued to yell. This RN left and advised Minerva AreolaEric AD and Irving Burtonmily charge about situation, as well as security.

## 2015-08-08 NOTE — ED Notes (Addendum)
The paitent said he has been having chest pain that started earlier today but says he was seen here for palpitations.  The patient denies any other symptoms.  The patient rates his chest discomfort 10/10.  He is also complaining of "muscle twitching" in his face and a headache.  He rates his headache 10/10.  The patient did get 324mg  of aspirin with EMS and he does have an IV.

## 2015-08-08 NOTE — ED Notes (Signed)
Pt in bed, still in gown.

## 2015-09-28 ENCOUNTER — Ambulatory Visit (INDEPENDENT_AMBULATORY_CARE_PROVIDER_SITE_OTHER): Payer: 59

## 2015-09-28 ENCOUNTER — Ambulatory Visit (INDEPENDENT_AMBULATORY_CARE_PROVIDER_SITE_OTHER): Payer: 59 | Admitting: Family Medicine

## 2015-09-28 VITALS — BP 122/68 | HR 68 | Temp 97.8°F | Resp 16 | Ht 66.0 in | Wt 172.2 lb

## 2015-09-28 DIAGNOSIS — F419 Anxiety disorder, unspecified: Secondary | ICD-10-CM | POA: Diagnosis not present

## 2015-09-28 DIAGNOSIS — Z8249 Family history of ischemic heart disease and other diseases of the circulatory system: Secondary | ICD-10-CM | POA: Diagnosis not present

## 2015-09-28 DIAGNOSIS — Z8659 Personal history of other mental and behavioral disorders: Secondary | ICD-10-CM

## 2015-09-28 DIAGNOSIS — R079 Chest pain, unspecified: Secondary | ICD-10-CM

## 2015-09-28 DIAGNOSIS — Z833 Family history of diabetes mellitus: Secondary | ICD-10-CM

## 2015-09-28 LAB — POCT GLYCOSYLATED HEMOGLOBIN (HGB A1C): HEMOGLOBIN A1C: 5.2

## 2015-09-28 LAB — TROPONIN I: Troponin I: <0.01 ng/mL (ref <0.06)

## 2015-09-28 LAB — POCT CBC
Granulocyte percent: 59.6 %G (ref 37–80)
HCT, POC: 43.3 % — AB (ref 43.5–53.7)
HEMOGLOBIN: 15.1 g/dL (ref 14.1–18.1)
LYMPH, POC: 1.9 (ref 0.6–3.4)
MCH, POC: 28.7 pg (ref 27–31.2)
MCHC: 34.9 g/dL (ref 31.8–35.4)
MCV: 82 fL (ref 80–97)
MID (cbc): 0.2 (ref 0–0.9)
MPV: 6.6 fL (ref 0–99.8)
POC Granulocyte: 3.2 (ref 2–6.9)
POC LYMPH %: 36.5 % (ref 10–50)
POC MID %: 3.9 %M (ref 0–12)
Platelet Count, POC: 270 10*3/uL (ref 142–424)
RBC: 5.27 M/uL (ref 4.69–6.13)
RDW, POC: 14.2 %
WBC: 5.3 10*3/uL (ref 4.6–10.2)

## 2015-09-28 LAB — LIPID PANEL
CHOL/HDL RATIO: 2.1 ratio (ref <=5.0)
CHOLESTEROL: 109 mg/dL — AB (ref 125–200)
HDL: 52 mg/dL (ref >=40)
LDL Cholesterol: 50 mg/dL (ref <130)
TRIGLYCERIDES: 37 mg/dL (ref <150)
VLDL: 7 mg/dL (ref <30)

## 2015-09-28 LAB — BASIC METABOLIC PANEL
BUN: 8 mg/dL (ref 7–25)
CHLORIDE: 101 mmol/L (ref 98–110)
CO2: 27 mmol/L (ref 20–31)
Calcium: 9.8 mg/dL (ref 8.6–10.3)
Creat: 0.96 mg/dL (ref 0.60–1.35)
Glucose, Bld: 96 mg/dL (ref 65–99)
POTASSIUM: 3.7 mmol/L (ref 3.5–5.3)
SODIUM: 137 mmol/L (ref 135–146)

## 2015-09-28 LAB — D-DIMER, QUANTITATIVE (NOT AT ARMC): D DIMER QUANT: 0.23 ug{FEU}/mL (ref 0.00–0.48)

## 2015-09-28 MED ORDER — DIAZEPAM 10 MG PO TABS: 5.0000 mg | ORAL_TABLET | Freq: Two times a day (BID) | ORAL | Status: DC

## 2015-09-28 MED ORDER — ALPRAZOLAM 0.25 MG PO TABS
0.2500 mg | ORAL_TABLET | Freq: Once | ORAL | Status: AC
  Administered 2015-09-28: 0.25 mg via ORAL

## 2015-09-28 MED ORDER — ESCITALOPRAM OXALATE 10 MG PO TABS: 10.0000 mg | ORAL_TABLET | Freq: Every day | ORAL | Status: DC

## 2015-09-28 NOTE — Progress Notes (Signed)
09/28/2015 1:06 PM   DOB: 1991-03-04 / MRN: [id number]  SUBJECTIVE:  Larry Estes is a 24 y.o. male with a pertinent medical history of anxiety presenting for left sided chest pain that has been present for three weeks but has become much worse today.  Describes the pain sharp with radiation to the left arm. He associates nausea.  Denies SOB, new DOE, diaphoresis and presyncope. Pain is improved with exercise. His mother has a history of diabetes, and he reports his grandmother had a heart attack in her thirties.  He also complains of HAs which have been worsening over the last three months.  The HA are typically focal and vary in region.  Describes them as sharp and denies pulsatility and nausea.      He is allergic to tramadol; fish allergy; and toradol.   He  has a past medical history of Allergic angiitis (HCC); Multiple allergies; Sickle cell trait (HCC); Anxiety; and Allergy.    He  reports that he has never smoked. He has never used smokeless tobacco. He reports that he does not drink alcohol or use illicit drugs. He  has no sexual activity history on file. The patient  has no past surgical history on file.  His family history includes CAD in his other; Diabetes in his other; Hypertension in his maternal grandmother, mother, other, and paternal grandmother.  Review of Systems  Constitutional: Negative for fever and chills.  Eyes: Negative for blurred vision.  Respiratory: Negative for cough and shortness of breath.   Cardiovascular: Positive for chest pain and palpitations. Negative for leg swelling.  Gastrointestinal: Negative for nausea and abdominal pain.  Genitourinary: Negative for dysuria, urgency and frequency.  Musculoskeletal: Negative for myalgias.  Skin: Negative for rash.  Neurological: Negative for dizziness, tingling and headaches.  Psychiatric/Behavioral: Negative for depression. The patient is not nervous/anxious.     Problem list and medications reviewed and  updated by myself where necessary, and exist elsewhere in the encounter.   OBJECTIVE:  BP 122/68 mmHg  Pulse 68  Temp(Src) 97.8 F (36.6 C) (Oral)  Resp 16  Ht 5\' 6"  (1.676 m)  Wt 172 lb 3.2 oz (78.109 kg)  BMI 27.81 kg/m2  SpO2 98%  Physical Exam  Constitutional: He is oriented to person, place, and time.  Cardiovascular: Normal rate and regular rhythm.  Exam reveals no gallop and no friction rub.   No murmur heard. Pulmonary/Chest: Effort normal and breath sounds normal. No respiratory distress. He has no wheezes. He has no rales. He exhibits no tenderness.  Abdominal: Soft. Bowel sounds are normal.  Neurological: He is alert and oriented to person, place, and time.  Skin: Skin is warm and dry.  Psychiatric: He has a normal mood and affect.   [facility] reading (PRIMARY) by  PA Jesika Men: STAT read please comment.    Results for orders placed or performed in visit on 09/28/15 (from the past 48 hour(s))  POCT CBC     Status: Abnormal   Collection Time: 09/28/15 12:48 PM  Result Value Ref Range   WBC 5.3 4.6 - 10.2 K/uL   Lymph, poc 1.9 0.6 - 3.4   POC LYMPH PERCENT 36.5 10 - 50 %L   MID (cbc) 0.2 0 - 0.9   POC MID % 3.9 0 - 12 %M   POC Granulocyte 3.2 2 - 6.9   Granulocyte percent 59.6 37 - 80 %G   RBC 5.27 4.69 - 6.13 M/uL   Hemoglobin 15.1 14.1 - 18.1  g/dL   HCT, POC 40.9 (A) 81.1 - 53.7 %   MCV 82.0 80 - 97 fL   MCH, POC 28.7 27 - 31.2 pg   MCHC 34.9 31.8 - 35.4 g/dL   RDW, POC 91.4 %   Platelet Count, POC 270 142 - 424 K/uL   MPV 6.6 0 - 99.8 fL  POCT glycosylated hemoglobin (Hb A1C)     Status: None   Collection Time: 09/28/15 12:48 PM  Result Value Ref Range   Hemoglobin A1C 5.2     ASSESSMENT AND PLAN  Larry Estes was seen today for chills, chest pain, headache and spasms.  Diagnoses and all orders for this visit:  Left sided chest pain: Patient with multiple negative workups in the past.  Will place an order for an echo to rule out any serious pathology and this  will take place on Monday. D-dimer and trop negative here today.  The remainder of this problem is managed with problem 5.  -     DG Chest 2 View; Future -     POCT CBC -     Basic metabolic panel -     Troponin I -     D-dimer, quantitative (not at [facility]) -     EKG 12-Lead -     Echocardiogram; Future  Family history of premature CAD -     Lipid panel  Family history of diabetes mellitus -     POCT glycosylated hemoglobin (Hb A1C)  History of anxiety disorder -     ALPRAZolam (XANAX) tablet 0.25 mg; Take 1 tablet (0.25 mg total) by mouth once. -     escitalopram (LEXAPRO) 10 MG tablet; Take 1 tablet (10 mg total) by mouth daily.  Anxiousness: Patient with a complete resolution of symptoms with 0.25 Xanax.  Will start him on diazepam daily for the next thirty days.  Starting Lexapro 10 mg daily.  Will see him back in one month and will ween of Valium at that time with a 5,5,5, 2.5, 2.5, 2.5 course.  -     diazepam (VALIUM) 10 MG tablet; Take 0.5 tablets (5 mg total) by mouth 2 (two) times daily.    The patient was advised to call or return to clinic if he does not see an improvement in symptoms or to seek the care of the closest emergency department if he worsens with the above plan.   Deliah Boston, MHS, PA-C [facility] 09/28/2015 1:06 PM

## 2015-09-28 NOTE — Patient Instructions (Signed)
Your echocardiogram is scheduled for 11 am on Monday the 26th.  [facility].

## 2015-10-01 ENCOUNTER — Ambulatory Visit (HOSPITAL_COMMUNITY)
Admission: RE | Admit: 2015-10-01 | Discharge: 2015-10-01 | Disposition: A | Discharge status: Home / Self Care | Payer: 59 | Source: Ambulatory Visit | Attending: Physician Assistant | Admitting: Physician Assistant

## 2015-10-01 DIAGNOSIS — D573 Sickle-cell trait: Secondary | ICD-10-CM | POA: Diagnosis not present

## 2015-10-01 DIAGNOSIS — R079 Chest pain, unspecified: Secondary | ICD-10-CM | POA: Insufficient documentation

## 2015-10-01 NOTE — Progress Notes (Signed)
  Echocardiogram 2D Echocardiogram has been performed.  Arvil ChacoFoster, [place]ouda Genova 10/01/2015, 12:13 PM

## 2015-10-04 NOTE — Progress Notes (Signed)
Patient ID: Larry Estes, male   DOB: [birth year], 24 y.o.   MRN: [id number] Pt assessed independently by myself as well, reviewed documentation and EKG and agree w/ assessment and plan. Norberto SorensonEva Teondre Jarosz, MD MPH

## 2015-11-14 ENCOUNTER — Emergency Department (HOSPITAL_COMMUNITY)
Admission: EM | Admit: 2015-11-14 | Discharge: 2015-11-14 | Disposition: A | Discharge status: Home / Self Care | Payer: 59 | Attending: Emergency Medicine | Admitting: Emergency Medicine

## 2015-11-14 ENCOUNTER — Encounter (HOSPITAL_COMMUNITY): Payer: Self-pay | Admitting: *Deleted

## 2015-11-14 DIAGNOSIS — R112 Nausea with vomiting, unspecified: Secondary | ICD-10-CM | POA: Diagnosis not present

## 2015-11-14 DIAGNOSIS — Z79899 Other long term (current) drug therapy: Secondary | ICD-10-CM | POA: Diagnosis not present

## 2015-11-14 DIAGNOSIS — Z862 Personal history of diseases of the blood and blood-forming organs and certain disorders involving the immune mechanism: Secondary | ICD-10-CM | POA: Diagnosis not present

## 2015-11-14 DIAGNOSIS — R519 Headache, unspecified: Secondary | ICD-10-CM

## 2015-11-14 DIAGNOSIS — R197 Diarrhea, unspecified: Secondary | ICD-10-CM | POA: Diagnosis not present

## 2015-11-14 DIAGNOSIS — J40 Bronchitis, not specified as acute or chronic: Secondary | ICD-10-CM | POA: Diagnosis not present

## 2015-11-14 DIAGNOSIS — J029 Acute pharyngitis, unspecified: Secondary | ICD-10-CM | POA: Diagnosis present

## 2015-11-14 DIAGNOSIS — R51 Headache: Secondary | ICD-10-CM | POA: Insufficient documentation

## 2015-11-14 MED ORDER — SODIUM CHLORIDE 0.9 % IV BOLUS (SEPSIS)
1000.0000 mL | Freq: Once | INTRAVENOUS | Status: AC
  Administered 2015-11-14: 1000 mL via INTRAVENOUS

## 2015-11-14 MED ORDER — DEXAMETHASONE SODIUM PHOSPHATE 10 MG/ML IJ SOLN
10.0000 mg | Freq: Once | INTRAMUSCULAR | Status: AC
  Administered 2015-11-14: 10 mg via INTRAVENOUS
  Filled 2015-11-14: qty 1

## 2015-11-14 MED ORDER — PROCHLORPERAZINE EDISYLATE 5 MG/ML IJ SOLN
10.0000 mg | Freq: Four times a day (QID) | INTRAMUSCULAR | Status: DC | PRN
  Administered 2015-11-14: 10 mg via INTRAVENOUS
  Filled 2015-11-14: qty 2

## 2015-11-14 MED ORDER — DIPHENHYDRAMINE HCL 50 MG/ML IJ SOLN
12.5000 mg | Freq: Once | INTRAMUSCULAR | Status: AC
  Administered 2015-11-14: 12.5 mg via INTRAVENOUS
  Filled 2015-11-14: qty 1

## 2015-11-14 MED ORDER — KETOROLAC TROMETHAMINE 15 MG/ML IJ SOLN
15.0000 mg | Freq: Once | INTRAMUSCULAR | Status: AC
  Administered 2015-11-14: 15 mg via INTRAVENOUS
  Filled 2015-11-14: qty 1

## 2015-11-14 NOTE — ED Notes (Signed)
Pt thinks he may have eaten some under cooked meat, pt has high anxiety and is listing multiple medical signs and symptoms. Wants provider to verbalize reassurance he will be ok

## 2015-11-14 NOTE — ED Notes (Signed)
Pt c/o sore throat, body aches, N/V/D; pt states that he has not felt "right" in the past 48hrs but that things have gotten worse over the last few hours; pt reports 4 episodes of vomiting and diarrhea; pt states "it hurts to swallow"; pt c/o feeling flushed and sweaty; pt states "something isn't right"

## 2015-11-15 ENCOUNTER — Other Ambulatory Visit: Payer: Self-pay | Admitting: Physician Assistant

## 2015-11-17 NOTE — ED Provider Notes (Signed)
CSN: [id number]     Arrival date & time 11/14/15  0551 History   First MD Initiated Contact with Patient 11/14/15 [phone]     Chief Complaint  Patient presents with  . Sore Throat  . Generalized Body Aches     (Consider location/radiation/quality/duration/timing/severity/associated sxs/prior Treatment) HPI   25 year old male with multiple complaints. His chief complaint is cough, sore throat and some body aches. Symptom onset 2-3 days ago. Persistent since then. Associated nausea, vomiting and diarrhea. No blood in his stool or emesis. Feels like something is just not quite right. Suspect that vomiting diarrhea may be secondary to food although his girlfriend ate the same thing she do not have the same symptoms.    Past Medical History  Diagnosis Date  . Allergic angiitis (HCC)   . Multiple allergies   . Sickle cell trait (HCC)   . Anxiety   . Allergy    History reviewed. No pertinent past surgical history. Family History  Problem Relation Age of Onset  . CAD Other   . Hypertension Other   . Diabetes Other   . Hypertension Mother   . Hypertension Maternal Grandmother   . Hypertension Paternal Grandmother    Social History  Substance Use Topics  . Smoking status: Never Smoker   . Smokeless tobacco: Never Used  . Alcohol Use: No    Review of Systems  All systems reviewed and negative, other than as noted in HPI.   Allergies  Tramadol; Fish allergy; and Toradol  Home Medications   Prior to Admission medications   Medication Sig Start Date End Date Taking? Authorizing Provider  acetaminophen (TYLENOL) 500 MG tablet Take 500 mg by mouth every 6 (six) hours as needed for mild pain or moderate pain.   Yes Historical Provider, MD  Aspirin-Salicylamide-Caffeine (BC HEADACHE POWDER PO) Take by mouth.   Yes Historical Provider, MD  diazepam (VALIUM) 10 MG tablet Take 0.5 tablets (5 mg total) by mouth 2 (two) times daily. 09/28/15  Yes Ofilia Neas, PA-C  escitalopram  (LEXAPRO) 10 MG tablet Take 1 tablet (10 mg total) by mouth daily. 09/28/15   Ofilia Neas, PA-C   BP 120/63 mmHg  Pulse 70  Temp(Src) 98.8 F (37.1 C) (Oral)  Resp 16  Ht  (1.702 m)  Wt 170 lb (77.111 kg)  BMI 26.62 kg/m2  SpO2 99% Physical Exam  Constitutional: He is oriented to person, place, and time. He appears well-developed and well-nourished. No distress.  HENT:  Head: Normocephalic and atraumatic.  Eyes: Conjunctivae are normal. Right eye exhibits no discharge. Left eye exhibits no discharge.  Neck: Neck supple.  No nuchal rigidity  Cardiovascular: Normal rate, regular rhythm and normal heart sounds.  Exam reveals no gallop and no friction rub.   No murmur heard. Pulmonary/Chest: Effort normal. No respiratory distress. He has wheezes.  Faint expiratory wheezing upper lobes. No increased work of breathing. Complete sentences.  Abdominal: Soft. He exhibits no distension. There is no tenderness.  Musculoskeletal: He exhibits no edema or tenderness.  Lower extremities symmetric as compared to each other. No calf tenderness. Negative Homan's. No palpable cords.   Neurological: He is alert and oriented to person, place, and time. No cranial nerve deficit. He exhibits normal muscle tone. Coordination normal.  Good finger to nose testing bilaterally. Steady gait.  Skin: Skin is warm and dry.  Psychiatric: He has a normal mood and affect. His behavior is normal. Thought content normal.  Nursing note and vitals reviewed.  ED Course  Procedures (including critical care time) Labs Review Labs Reviewed - No data to display  Imaging Review No results found. I have personally reviewed and evaluated these images and lab results as part of my medical decision-making.   EKG Interpretation None      MDM   Final diagnoses:  Bronchitis  Nausea vomiting and diarrhea  Nonintractable headache, unspecified chronicity pattern, unspecified headache type    25 year old  male with multiple complaints. Suspect he likely a viral bronchitis. Suspect there is also significant anxiety component to his symptoms. Overall, very low suspicion for emergent process. Reports complete resolution of his headache. He has a nonfocal neurological examination. It has been determined that no acute conditions requiring further emergency intervention are present at this time. The patient has been advised of the diagnosis and plan. I reviewed any labs and imaging including any potential incidental findings. We have discussed signs and symptoms that warrant return to the ED and they are listed in the discharge instructions.      Raeford Razor, MD 11/17/15 [phone]

## 2015-11-19 NOTE — Telephone Encounter (Signed)
Negative.  Will need to start him on a different medication if the valium has helped him.  Advise that we can start him on fluoxetine 20 mg daily.  If he is amenable to this I will send to the pharmacy.  If he is not amenable to this then RTC.  Deliah Boston, MS, PA-C 7:36 PM, 11/19/2015

## 2015-11-20 NOTE — Telephone Encounter (Signed)
Please see below.

## 2015-11-20 NOTE — Telephone Encounter (Signed)
Tried to call pt. No answer/no VM.

## 2015-11-20 NOTE — Telephone Encounter (Signed)
Casimiro Needle, you had already Rxd Lexapro for him. Did you want to put him on prozac along with the Lexapro? Just wanted to be clear on plan before calling pt.

## 2015-11-20 NOTE — Telephone Encounter (Signed)
Thank you Britta Mccreedy.  Lets see him back in two weeks for a follow up.  The Lexapro should be in full effect by that time and will likely increase the dose.  Deliah Boston, MS, PA-C 2:09 PM, 11/20/2015

## 2015-11-21 NOTE — Telephone Encounter (Signed)
Pt stated that he actually still has several doses of valium left. He hasn't been taking it that often because he doesn't like the way it makes him feel. Pt reported that he has been taking the Lexapro as Rxd and agreed to RTC for appt. Sched appt for 2/28.

## 2015-12-04 ENCOUNTER — Ambulatory Visit (INDEPENDENT_AMBULATORY_CARE_PROVIDER_SITE_OTHER): Payer: 59 | Admitting: Physician Assistant

## 2015-12-04 ENCOUNTER — Encounter: Payer: Self-pay | Admitting: Physician Assistant

## 2015-12-04 VITALS — BP 110/80 | HR 63 | Temp 98.6°F | Resp 16 | Ht 66.5 in | Wt 173.6 lb

## 2015-12-04 DIAGNOSIS — G44209 Tension-type headache, unspecified, not intractable: Secondary | ICD-10-CM

## 2015-12-04 DIAGNOSIS — Z760 Encounter for issue of repeat prescription: Secondary | ICD-10-CM | POA: Diagnosis not present

## 2015-12-04 DIAGNOSIS — Z8659 Personal history of other mental and behavioral disorders: Secondary | ICD-10-CM | POA: Diagnosis not present

## 2015-12-04 MED ORDER — BUSPIRONE HCL 15 MG PO TABS: 7.5000 mg | ORAL_TABLET | Freq: Three times a day (TID) | ORAL | Status: DC

## 2015-12-04 MED ORDER — ACETAMINOPHEN 325 MG PO TABS
1000.0000 mg | ORAL_TABLET | Freq: Once | ORAL | Status: AC
  Administered 2015-12-04: 975 mg via ORAL

## 2015-12-04 MED ORDER — SUMATRIPTAN SUCCINATE 50 MG PO TABS: 50.0000 mg | ORAL_TABLET | Freq: Once | ORAL | Status: DC

## 2015-12-04 MED ORDER — ESCITALOPRAM OXALATE 10 MG PO TABS: 10.0000 mg | ORAL_TABLET | Freq: Every day | ORAL | Status: DC

## 2015-12-04 NOTE — Progress Notes (Signed)
12/04/2015 5:09 PM   DOB: 03/22/1991 / MRN: [id number]  SUBJECTIVE:  Larry Estes is a 25 y.o. male presenting for a follow up of anxiety.  He has been taking Lexapro as prescribed and his anxiousness is roughly 40% better.  He denies side effects.  He continues to complain of panic like symptoms roughly 1 time every 2 weeks however, and states he would like to try something non habit forming to manage acute symptoms.  He is not ready to increase the Lexapro dosage.   He complains of a generalized HA today and has requested Tylenol, which he often takes with excellent relief of symptoms.  He denies vision changes.    He is allergic to tramadol; fish allergy; and toradol.   He  has a past medical history of Allergic angiitis (HCC); Multiple allergies; Sickle cell trait (HCC); Anxiety; and Allergy.    He  reports that he has never smoked. He has never used smokeless tobacco. He reports that he does not drink alcohol or use illicit drugs. He  has no sexual activity history on file. The patient  has no past surgical history on file.  His family history includes CAD in his other; Diabetes in his other; Hypertension in his maternal grandmother, mother, other, and paternal grandmother.  Review of Systems  Constitutional: Negative for fever and chills.  Eyes: Negative for blurred vision.  Respiratory: Negative for cough and shortness of breath.   Cardiovascular: Negative for chest pain.  Gastrointestinal: Negative for nausea and abdominal pain.  Genitourinary: Negative for dysuria, urgency and frequency.  Musculoskeletal: Negative for myalgias.  Skin: Negative for rash.  Neurological: Negative for dizziness, tingling and headaches.  Psychiatric/Behavioral: Negative for depression. The patient is not nervous/anxious.     Problem list and medications reviewed and updated by myself where necessary, and exist elsewhere in the encounter.   OBJECTIVE:  BP 110/80 mmHg  Pulse 63  Temp(Src) 98.6  F (37 C) (Oral)  Resp 16  Ht 5' 6.5" (1.689 m)  Wt 173 lb 9.6 oz (78.744 kg)  BMI 27.60 kg/m2  SpO2 98%  Physical Exam  Constitutional: He is oriented to person, place, and time. He appears well-developed. He does not appear ill.  Eyes: Conjunctivae and EOM are normal. Pupils are equal, round, and reactive to light.  Cardiovascular: Normal rate.   Pulmonary/Chest: Effort normal.  Abdominal: He exhibits no distension.  Musculoskeletal: Normal range of motion.  Neurological: He is alert and oriented to person, place, and time. He has normal strength and normal reflexes. No cranial nerve deficit or sensory deficit. Coordination and gait normal. GCS eye subscore is 4. GCS verbal subscore is 5. GCS motor subscore is 6.  Skin: Skin is warm and dry. He is not diaphoretic.  Psychiatric: He has a normal mood and affect.  Nursing note and vitals reviewed.   No results found for this or any previous visit (from the past 72 hour(s)).  No results found.  ASSESSMENT AND PLAN  Larry Estes was seen today for follow-up, anxiety, headache and consultation.  Diagnoses and all orders for this visit:  History of anxiety disorder -     busPIRone (BUSPAR) 15 MG tablet; Take 0.5-1 tablets (7.5-15 mg total) by mouth 3 (three) times daily. -     escitalopram (LEXAPRO) 10 MG tablet; Take 1 tablet (10 mg total) by mouth daily.  Medication refill -     SUMAtriptan (IMITREX) 50 MG tablet; Take 1 tablet (50 mg total) by mouth once.  May repeat in 2 hours if headache persists or recurs. -     acetaminophen (TYLENOL) tablet 975 mg; Take 3 tablets (975 mg total) by mouth once.  Tension-type headache, not intractable, unspecified chronicity pattern: No alarm features.  Patient given tylenol per his request.     The patient was advised to call or return to clinic if he does not see an improvement in symptoms or to seek the care of the closest emergency department if he worsens with the above plan.   Deliah Boston, MHS, PA-C [facility] 12/04/2015 5:09 PM

## 2015-12-11 ENCOUNTER — Ambulatory Visit (INDEPENDENT_AMBULATORY_CARE_PROVIDER_SITE_OTHER): Payer: 59 | Admitting: Family Medicine

## 2015-12-11 VITALS — BP 112/80 | HR 53 | Temp 98.4°F | Resp 20 | Ht 66.5 in | Wt 178.2 lb

## 2015-12-11 DIAGNOSIS — M791 Myalgia, unspecified site: Secondary | ICD-10-CM

## 2015-12-11 MED ORDER — NAPROXEN 500 MG PO TABS: 500.0000 mg | ORAL_TABLET | Freq: Two times a day (BID) | ORAL | Status: DC | PRN

## 2015-12-11 MED ORDER — ONDANSETRON 4 MG PO TBDP
4.0000 mg | ORAL_TABLET | Freq: Three times a day (TID) | ORAL | Status: DC | PRN
Start: 2015-12-11 — End: 2016-09-26

## 2015-12-11 MED ORDER — PROMETHAZINE HCL 25 MG/ML IJ SOLN
12.5000 mg | Freq: Once | INTRAMUSCULAR | Status: AC
Start: 2015-12-11 — End: 2015-12-11
  Administered 2015-12-11: 12.5 mg via INTRAMUSCULAR

## 2015-12-11 NOTE — Assessment & Plan Note (Addendum)
Most likely viral in nature causing systemic Sx.   - Zofran for nausea.  Naprosyn for HA.  Continue with fluid and also mucinex/nasal saline for URI Sx.   - Shot of Phenergan for immediate relief per his request.  Did not drive and will have someone else driving him.

## 2015-12-11 NOTE — Progress Notes (Signed)
Hamzeh Jimmey Ralpharker is a 25 y.o. male who presents today for diarrhea, nausea, HA, rhinorrhea, dyspepsia.   Generalized flu Sx - Ongoing for the past one week with worsening of Sx last night.  Denies true fever, chills or sweats.  Has tried occasional tylenol and alka-selzer which has not helped per his report.  Worst Sx today are HA and dyspepsia.  No melena, hematochezia or true vomiting.    Past Medical History  Diagnosis Date  . Allergic angiitis (HCC)   . Multiple allergies   . Sickle cell trait (HCC)   . Anxiety   . Allergy     History  Smoking status  . Never Smoker   Smokeless tobacco  . Never Used    Family History  Problem Relation Age of Onset  . CAD Other   . Hypertension Other   . Diabetes Other   . Hypertension Mother   . Hypertension Maternal Grandmother   . Hypertension Paternal Grandmother     Current Outpatient Prescriptions on File Prior to Visit  Medication Sig Dispense Refill  . busPIRone (BUSPAR) 15 MG tablet Take 0.5-1 tablets (7.5-15 mg total) by mouth 3 (three) times daily. 30 tablet 3  . escitalopram (LEXAPRO) 10 MG tablet Take 1 tablet (10 mg total) by mouth daily. 30 tablet 8  . SUMAtriptan (IMITREX) 50 MG tablet Take 1 tablet (50 mg total) by mouth once. May repeat in 2 hours if headache persists or recurs. 10 tablet 6  . Aspirin-Salicylamide-Caffeine (BC HEADACHE POWDER PO) Take by mouth. Reported on 12/11/2015    . [DISCONTINUED] potassium chloride SA (K-DUR,KLOR-CON) 20 MEQ tablet Take 1 tablet (20 mEq total) by mouth 2 (two) times daily. (Patient not taking: Reported on 03/04/2015) 10 tablet 0   No current facility-administered medications on file prior to visit.    ROS: Per HPI.  All other systems reviewed and are negative.   Physical Exam Filed Vitals:   12/11/15 1600  BP: 112/80  Pulse: 53  Temp: 98.4 F (36.9 C)  Resp: 20    Physical Examination: General appearance - alert, well appearing, and in no distress Mouth - mucous membranes  moist, pharynx normal without lesions Chest - clear to auscultation, no wheezes, rales or rhonchi, symmetric air entry Heart - normal rate and regular rhythm, no murmurs noted Abdomen - soft, nontender, nondistended, no masses or organomegaly

## 2015-12-19 ENCOUNTER — Ambulatory Visit (INDEPENDENT_AMBULATORY_CARE_PROVIDER_SITE_OTHER): Payer: 59 | Admitting: Physician Assistant

## 2015-12-19 ENCOUNTER — Encounter: Payer: Self-pay | Admitting: Physician Assistant

## 2015-12-19 VITALS — BP 112/67 | HR 66 | Temp 98.2°F | Resp 16 | Ht 66.5 in | Wt 173.6 lb

## 2015-12-19 DIAGNOSIS — R51 Headache: Secondary | ICD-10-CM | POA: Diagnosis not present

## 2015-12-19 DIAGNOSIS — R519 Headache, unspecified: Secondary | ICD-10-CM

## 2015-12-19 LAB — BASIC METABOLIC PANEL
BUN: 14 mg/dL (ref 7–25)
CALCIUM: 10.2 mg/dL (ref 8.6–10.3)
CO2: 30 mmol/L (ref 20–31)
Chloride: 101 mmol/L (ref 98–110)
Creat: 1.17 mg/dL (ref 0.60–1.35)
GLUCOSE: 96 mg/dL (ref 65–99)
POTASSIUM: 4 mmol/L (ref 3.5–5.3)
Sodium: 139 mmol/L (ref 135–146)

## 2015-12-19 LAB — CBC
HCT: 43.6 % (ref 39.0–52.0)
Hemoglobin: 14.7 g/dL (ref 13.0–17.0)
MCH: 28.2 pg (ref 26.0–34.0)
MCHC: 33.7 g/dL (ref 30.0–36.0)
MCV: 83.5 fL (ref 78.0–100.0)
MPV: 8.9 fL (ref 8.6–12.4)
Platelets: 222 10*3/uL (ref 150–400)
RBC: 5.22 MIL/uL (ref 4.22–5.81)
RDW: 14.6 % (ref 11.5–15.5)
WBC: 5.6 10*3/uL (ref 4.0–10.5)

## 2015-12-19 MED ORDER — KETOROLAC TROMETHAMINE 60 MG/2ML IM SOLN
60.0000 mg | Freq: Once | INTRAMUSCULAR | Status: AC
  Administered 2015-12-19: 60 mg via INTRAMUSCULAR

## 2015-12-19 NOTE — Progress Notes (Signed)
12/19/2015 3:17 PM   DOB: Nov 30, 1990 / MRN: [id number]  SUBJECTIVE:  Larry Estes is a 25 y.o. male presenting for persistent headaches and medication review.  He complains of a HA right now and would like something to calm this down. States the location of the HA are variable, however are typically unilateral and throbbing in nature.  He associates nausea.  He has no allergy to NSAIDs.  He has tried Naprosyn 500 mg and Imitrex for these HA and gets transient relief.      He is allergic to tramadol and fish allergy.   He  has a past medical history of Allergic angiitis (HCC); Multiple allergies; Sickle cell trait (HCC); Anxiety; and Allergy.    He  reports that he has never smoked. He has never used smokeless tobacco. He reports that he does not drink alcohol or use illicit drugs. He  has no sexual activity history on file. The patient  has no past surgical history on file.  His family history includes CAD in his other; Diabetes in his other; Hypertension in his maternal grandmother, mother, other, and paternal grandmother.  Review of Systems  Constitutional: Negative for fever and chills.  Eyes: Negative for blurred vision.  Respiratory: Negative for cough and shortness of breath.   Cardiovascular: Negative for chest pain.  Gastrointestinal: Negative for nausea and abdominal pain.  Genitourinary: Negative for dysuria, urgency and frequency.  Musculoskeletal: Negative for myalgias.  Skin: Negative for rash.  Neurological: Positive for headaches. Negative for tingling, tremors, sensory change, speech change, focal weakness and loss of consciousness.  Psychiatric/Behavioral: Negative for depression. The patient is not nervous/anxious.     Problem list and medications reviewed and updated by myself where necessary, and exist elsewhere in the encounter.   OBJECTIVE:  BP 112/67 mmHg  Pulse 66  Temp(Src) 98.2 F (36.8 C) (Oral)  Resp 16  Ht 5' 6.5" (1.689 m)  Wt 173 lb 9.6 oz  (78.744 kg)  BMI 27.60 kg/m2  Physical Exam  Constitutional: He is oriented to person, place, and time. He appears well-developed. He does not appear ill.  Eyes: Conjunctivae and EOM are normal. Pupils are equal, round, and reactive to light.  Cardiovascular: Normal rate.   Pulmonary/Chest: Effort normal.  Abdominal: He exhibits no distension.  Musculoskeletal: Normal range of motion.  Neurological: He is alert and oriented to person, place, and time. He displays no tremor. No cranial nerve deficit or sensory deficit. He exhibits normal muscle tone. He displays no seizure activity. Coordination and gait normal. GCS eye subscore is 4. GCS verbal subscore is 5. GCS motor subscore is 6.  Skin: Skin is warm and dry. He is not diaphoretic.  Psychiatric: He has a normal mood and affect.  Nursing note and vitals reviewed.   No results found for this or any previous visit (from the past 72 hour(s)).  No results found.  ASSESSMENT AND PLAN  Steed was seen today for migraine and referral.  Diagnoses and all orders for this visit:  Acute nonintractable headache, unspecified headache type -     ketorolac (TORADOL) injection 60 mg; Inject 2 mLs (60 mg total) into the muscle once.  Persistent headaches: He has now failed NSAIDs and Triptan medication.  Will get him into neurology for further eval and management.   -     Ambulatory referral to Neurology -     CBC -     Basic metabolic panel -     TSH  The patient was advised to call or return to clinic if he does not see an improvement in symptoms or to seek the care of the closest emergency department if he worsens with the above plan.   [name], MHS, PA-C [facility] 12/19/2015 3:17 PM

## 2015-12-20 LAB — TSH: TSH: 2.88 m[IU]/L (ref 0.40–4.50)

## 2015-12-26 ENCOUNTER — Encounter: Payer: Self-pay | Admitting: Neurology

## 2015-12-26 ENCOUNTER — Ambulatory Visit (INDEPENDENT_AMBULATORY_CARE_PROVIDER_SITE_OTHER): Payer: 59 | Admitting: Neurology

## 2015-12-26 ENCOUNTER — Telehealth: Payer: Self-pay

## 2015-12-26 ENCOUNTER — Encounter: Payer: Self-pay | Admitting: *Deleted

## 2015-12-26 VITALS — BP 128/73 | HR 69 | Ht 65.5 in | Wt 183.5 lb

## 2015-12-26 DIAGNOSIS — R51 Headache: Secondary | ICD-10-CM

## 2015-12-26 DIAGNOSIS — G441 Vascular headache, not elsewhere classified: Secondary | ICD-10-CM | POA: Diagnosis not present

## 2015-12-26 DIAGNOSIS — R519 Headache, unspecified: Secondary | ICD-10-CM

## 2015-12-26 HISTORY — DX: Headache, unspecified: R51.9

## 2015-12-26 MED ORDER — PROPRANOLOL HCL 10 MG PO TABS: ORAL_TABLET | ORAL | Status: DC

## 2015-12-26 NOTE — Patient Instructions (Signed)
   Inderal (propranolol) is a blood pressure medication that is commonly used for migraine headaches. This is a type of beta blocker. The most common side effects include low heart rate, dizziness, fatigue, and increased depression. This medication may worsen asthma. If you believe that you are having side effects on this medication, please contact our office.  

## 2015-12-26 NOTE — Telephone Encounter (Signed)
Can you review?

## 2015-12-26 NOTE — Telephone Encounter (Signed)
Pt came in and and wanted lab results.  I pulled pt back privately to talk with him to let him know labs were normal and he wanted to know if we had done a test for herpes.  He then saw [name] going into a room and pulled him to ask him about his lips.  Casimiro NeedleMichael told him that he did not think this was herpes and that he would need to be seen. Took pt into a room to talk more in detail about what he wanted.  I advised pt that I will check to see if I could add test on.  He was very anxious.  He stated that he thinks his current partner is hiding something from him.  He seemed a little paranoid about the situation.  Finally got pt to leave because he was very inpatient.  Spoke with Casimiro NeedleMichael about the test and he thinks that the patient needs to be seen for a full STD workup.     Tried calling pt to explain that he needs to come back in for a full STD workup.  Cannot add test because he did not state any of this a last OV and they have thrown the blood away anyway.  HE NEEDS TO COME IN TO BE SEEN!!   No answer/ no vm

## 2015-12-26 NOTE — Telephone Encounter (Signed)
Patient called to request his lab results.  He was seen on 12/19/15 by [name].  CB#: [phone]  Placing as high priority - patient was upset that his results have been in since 12/19/15, but had not been reviewed yet.

## 2015-12-26 NOTE — Progress Notes (Signed)
Reason for visit: Headache  Referring physician: Dr. [name] is a 25 y.o. male  History of present illness:  Larry Estes is a 25 year old right-handed black male with a history of headaches that began at the end of December 2016 or early January 2017. The patient indicates that he has essentially daily headaches, he takes medication for the headache which may help it, but it tends to come back later. The headache is either on the right or left side, and may be associated with a throbbing sensation or a dull achy pain. He has phonophobia, not photophobia. He may have nausea without vomiting. He denies any loss of vision but he does have blurred vision. He has some clouding of cognitive abilities during the headache. He has some sensation of anxiety with the headache. He denies any neck stiffness. He is able to work through the headache. He denies any numbness or weakness of extremities, he denies issues with balance or difficulty controlling the bowels or the bladder. 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 The patient indicates that his mother also has headaches. He has been taking some Imitrex 50 mg tablets with some improvement of the headache. He has not been on any daily medication for the headache.  Past Medical History  Diagnosis Date  . Allergic angiitis (HCC)   . Multiple allergies   . Sickle cell trait (HCC)   . Anxiety   . Allergy   . Headache 12/26/2015    History reviewed. No pertinent past surgical history.  Family History  Problem Relation Age of Onset  . CAD Other   . Hypertension Other   . Diabetes Other   . Hypertension Mother   . Hypertension Maternal Grandmother   . Hypertension Paternal Grandmother     Social history:  reports that he has never smoked. He has never used smokeless tobacco. He reports that he does not drink alcohol or use illicit drugs.  Medications:  Prior to Admission medications     Medication Sig Start Date End Date Taking? Authorizing Provider  busPIRone (BUSPAR) 15 MG tablet Take 0.5-1 tablets (7.5-15 mg total) by mouth 3 (three) times daily. 12/04/15  Yes Ofilia Neas, PA-C  escitalopram (LEXAPRO) 10 MG tablet Take 1 tablet (10 mg total) by mouth daily. 12/04/15  Yes Ofilia Neas, PA-C  naproxen (NAPROSYN) 500 MG tablet Take 1 tablet (500 mg total) by mouth 2 (two) times daily as needed. 12/11/15  Yes Bryan R Hess, DO  ondansetron (ZOFRAN ODT) 4 MG disintegrating tablet Take 1 tablet (4 mg total) by mouth every 8 (eight) hours as needed for nausea or vomiting. 12/11/15  Yes Bryan R Hess, DO  SUMAtriptan (IMITREX) 50 MG tablet Take 1 tablet (50 mg total) by mouth once. May repeat in 2 hours if headache persists or recurs. 12/04/15  Yes Ofilia Neas, PA-C      Allergies  Allergen Reactions  . Tramadol Anaphylaxis, Itching and Nausea And Vomiting  . Fish Allergy Hives, Itching and Swelling    ROS:  Out of a complete 14 system review of symptoms, the patient complains only of the following symptoms, and all other reviewed systems are negative.  Fevers, chills Ringing in the ears, difficulty swallowing Blurred vision Shortness of breath Constipation Feeling hot, cold Allergies Memory loss, confusion, headache, difficulty swallowing Anxiety  Blood pressure 128/73, pulse 69, height 5' 5.5" (1.664 m), weight 183 lb 8 oz (83.235  kg).  Physical Exam  General: The patient is alert and cooperative at the time of the examination.  Eyes: Pupils are equal, round, and reactive to light. Discs are flat bilaterally.  Neck: The neck is supple, no carotid bruits are noted.  Respiratory: The respiratory examination is clear.  Cardiovascular: The cardiovascular examination reveals a regular rate and rhythm, no obvious murmurs or rubs are noted.  Skin: Extremities are without significant edema.  Neurologic Exam  Mental status: The patient is alert and oriented x  3 at the time of the examination. The patient has apparent normal recent and remote memory, with an apparently normal attention span and concentration ability.  Cranial nerves: Facial symmetry is present. There is good sensation of the face to pinprick and soft touch bilaterally. The strength of the facial muscles and the muscles to head turning and shoulder shrug are normal bilaterally. Speech is well enunciated, no aphasia or dysarthria is noted. Extraocular movements are full. Visual fields are full. The tongue is midline, and the patient has symmetric elevation of the soft palate. No obvious hearing deficits are noted.  Motor: The motor testing reveals 5 over 5 strength of all 4 extremities. Good symmetric motor tone is noted throughout.  Sensory: Sensory testing is intact to pinprick, soft touch, vibration sensation, and position sense on all 4 extremities. No evidence of extinction is noted.  Coordination: Cerebellar testing reveals good finger-nose-finger and heel-to-shin bilaterally.  Gait and station: Gait is normal. Tandem gait is normal. Romberg is negative. No drift is seen.  Reflexes: Deep tendon reflexes are symmetric and normal bilaterally. Toes are downgoing bilaterally.   Assessment/Plan:  1. Probable migraine headache  The patient is having unilateral type headaches associated with some throbbing sensation. The patient is getting some benefit from Imitrex, but he is also taking daily BC powders. He denies any other caffeine intake. I have cautioned him about using BC powders daily, I would recommend Advil or Naprosyn. The patient will be placed on propranolol for the headache, and he will follow-up in about 3 months. He is to contact me for any dose adjustments for the medication.  Marlan Palau. Keith Jovanne Riggenbach MD 12/26/2015 7:59 PM  [facility] [address] [place], [place] [id number]  Phone [phone] Fax [phone]

## 2015-12-27 NOTE — Telephone Encounter (Signed)
Pt will be in today for a full STI workup.

## 2015-12-27 NOTE — Telephone Encounter (Signed)
Hi Shaketia.  Thank you for fielding his questions in the lobby.  If he is concerned for STI I do recommend that we do a workup.  Given the nature of how we can test for herpes I advise that we avoid testing his blood, as if this comes back positive it will feed into his anxiety.  If he would like us to test him for herpes HE NEEDS TO HAVE AND ACTIVE LESION THAT CAN BE CULTURED.   I do suspect that he may be suffering from more than anxiety, and he appeared to be somewhat paranoid yesterday when he urgently interrupted me during patient care yesterday so I could look at his lips. This intensity in which he did this was very odd.   I had encouraged him in our last appointment to increase his Lexapro, however I am starting to feel this may not help.  I am certain he will be back for another somatic complaint very soon and he will benefit from a mental status exam with consideration for an alternative axis 1 or axis 2 diagnosis.   Deliah BostonMichael Nyoka Alcoser, MS, PA-C 6:36 AM, 12/27/2015

## 2016-01-19 ENCOUNTER — Encounter (HOSPITAL_COMMUNITY): Payer: Self-pay

## 2016-01-19 ENCOUNTER — Emergency Department (HOSPITAL_COMMUNITY)
Admission: EM | Admit: 2016-01-19 | Discharge: 2016-01-19 | Disposition: A | Discharge status: Home / Self Care | Payer: 59 | Attending: Emergency Medicine | Admitting: Emergency Medicine

## 2016-01-19 ENCOUNTER — Emergency Department (HOSPITAL_COMMUNITY): Payer: 59

## 2016-01-19 DIAGNOSIS — Z79899 Other long term (current) drug therapy: Secondary | ICD-10-CM | POA: Insufficient documentation

## 2016-01-19 DIAGNOSIS — F419 Anxiety disorder, unspecified: Secondary | ICD-10-CM

## 2016-01-19 DIAGNOSIS — Z862 Personal history of diseases of the blood and blood-forming organs and certain disorders involving the immune mechanism: Secondary | ICD-10-CM | POA: Diagnosis not present

## 2016-01-19 DIAGNOSIS — R079 Chest pain, unspecified: Secondary | ICD-10-CM | POA: Diagnosis present

## 2016-01-19 DIAGNOSIS — R002 Palpitations: Secondary | ICD-10-CM

## 2016-01-19 LAB — BASIC METABOLIC PANEL
ANION GAP: 5 (ref 5–15)
BUN: 8 mg/dL (ref 6–20)
CALCIUM: 8.9 mg/dL (ref 8.9–10.3)
CO2: 26 mmol/L (ref 22–32)
Chloride: 109 mmol/L (ref 101–111)
Creatinine, Ser: 1.08 mg/dL (ref 0.61–1.24)
GFR calc Af Amer: >60 mL/min (ref >60)
GLUCOSE: 120 mg/dL — AB (ref 65–99)
Potassium: 3.9 mmol/L (ref 3.5–5.1)
SODIUM: 140 mmol/L (ref 135–145)

## 2016-01-19 LAB — I-STAT TROPONIN, ED: TROPONIN I, POC: 0 ng/mL (ref 0.00–0.08)

## 2016-01-19 LAB — CBC
HCT: 37.3 % — ABNORMAL LOW (ref 39.0–52.0)
Hemoglobin: 13 g/dL (ref 13.0–17.0)
MCH: 28.1 pg (ref 26.0–34.0)
MCHC: 34.9 g/dL (ref 30.0–36.0)
MCV: 80.6 fL (ref 78.0–100.0)
Platelets: 250 10*3/uL (ref 150–400)
RBC: 4.63 MIL/uL (ref 4.22–5.81)
RDW: 14 % (ref 11.5–15.5)
WBC: 6.1 10*3/uL (ref 4.0–10.5)

## 2016-01-19 MED ORDER — DIAZEPAM 5 MG PO TABS: 2.5000 mg | ORAL_TABLET | Freq: Four times a day (QID) | ORAL | Status: DC | PRN

## 2016-01-19 NOTE — ED Notes (Signed)
Patient is alert and oriented x3.  He was given DC instructions and follow up visit instructions.  Patient gave verbal understanding.  He was DC ambulatory under his own power to home.  V/S stable.  He was not showing any signs of distress on DC 

## 2016-01-19 NOTE — ED Notes (Signed)
Pt presents with c/o chest pain on the left side of his chest that radiates down his left arm. Pt reports some shortness of breath and nausea as well. Pt reports that when he starts to feel this chest pain, he feels like his heart is racing and he becomes diaphoretic and very anxious. No distress noted at this time.

## 2016-01-19 NOTE — ED Notes (Signed)
Pt placed on monitor.  

## 2016-01-23 NOTE — ED Provider Notes (Signed)
CSN: [id number]     Arrival date & time 01/19/16  1359 History   First MD Initiated Contact with Patient 01/19/16 1630     Chief Complaint  Patient presents with  . Chest Pain     (Consider location/radiation/quality/duration/timing/severity/associated sxs/prior Treatment) Patient is a 25 y.o. male presenting with chest pain. The history is provided by the patient.  Chest Pain Pain location:  L chest Pain quality: tightness   Pain radiates to:  L arm Pain radiates to the back: no   Pain severity:  Moderate Onset quality:  Sudden Timing:  Intermittent Progression:  Waxing and waning Chronicity:  Recurrent Context: not breathing, no movement and no stress   Relieved by: valium. Worsened by:  Certain positions Risk factors: male sex   Risk factors: no aortic disease, no coronary artery disease, no diabetes mellitus, no Ehlers-Danlos syndrome, no high cholesterol, no hypertension, no immobilization, no Marfan's syndrome, no prior DVT/PE, no smoking and no surgery     Past Medical History  Diagnosis Date  . Allergic angiitis (HCC)   . Multiple allergies   . Sickle cell trait (HCC)   . Anxiety   . Allergy   . Headache 12/26/2015   History reviewed. No pertinent past surgical history. Family History  Problem Relation Age of Onset  . CAD Other   . Hypertension Other   . Diabetes Other   . Hypertension Mother   . Hypertension Maternal Grandmother   . Hypertension Paternal Grandmother    Social History  Substance Use Topics  . Smoking status: Never Smoker   . Smokeless tobacco: Never Used  . Alcohol Use: No    Review of Systems  Cardiovascular: Positive for chest pain.  All other systems reviewed and are negative.     Allergies  Fish allergy and Tramadol  Home Medications   Prior to Admission medications   Medication Sig Start Date End Date Taking? Authorizing Provider  Aspirin-Salicylamide-Caffeine (BC HEADACHE POWDER PO) Take 1 packet by mouth every 8  (eight) hours as needed (for pain).   Yes Historical Provider, MD  escitalopram (LEXAPRO) 10 MG tablet Take 1 tablet (10 mg total) by mouth daily. 12/04/15  Yes Ofilia Neas, PA-C  naproxen (NAPROSYN) 500 MG tablet Take 1 tablet (500 mg total) by mouth 2 (two) times daily as needed. Patient taking differently: Take 500 mg by mouth 2 (two) times daily as needed for mild pain.  12/11/15  Yes Bryan R Hess, DO  ondansetron (ZOFRAN ODT) 4 MG disintegrating tablet Take 1 tablet (4 mg total) by mouth every 8 (eight) hours as needed for nausea or vomiting. 12/11/15  Yes Bryan R Hess, DO  SUMAtriptan (IMITREX) 50 MG tablet Take 1 tablet (50 mg total) by mouth once. May repeat in 2 hours if headache persists or recurs. 12/04/15  Yes Ofilia Neas, PA-C  busPIRone (BUSPAR) 15 MG tablet Take 0.5-1 tablets (7.5-15 mg total) by mouth 3 (three) times daily. Patient not taking: Reported on 01/19/2016 12/04/15   Ofilia Neas, PA-C  diazepam (VALIUM) 5 MG tablet Take 0.5 tablets (2.5 mg total) by mouth every 6 (six) hours as needed for anxiety. 01/19/16   Derwood Kaplan, MD  propranolol (INDERAL) 10 MG tablet One tablet twice a day for 1 week, then take 2 tablets twice a day Patient not taking: Reported on 01/19/2016 12/26/15   York Spaniel, MD   BP 130/52 mmHg  Pulse 57  Temp(Src) 97.7 F (36.5 C) (Oral)  Resp 18  SpO2 99% Physical Exam  Constitutional: He is oriented to person, place, and time. He appears well-developed.  HENT:  Head: Atraumatic.  Neck: Neck supple.  Cardiovascular: Normal rate and intact distal pulses.   No murmur heard. Pulmonary/Chest: Effort normal.  Musculoskeletal: He exhibits no edema or tenderness.  Neurological: He is alert and oriented to person, place, and time.  Skin: Skin is warm.  Nursing note and vitals reviewed.   ED Course  Procedures (including critical care time) Labs Review Labs Reviewed  BASIC METABOLIC PANEL - Abnormal; Notable for the following:     Glucose, Bld 120 (*)    All other components within normal limits  CBC - Abnormal; Notable for the following:    HCT 37.3 (*)    All other components within normal limits  I-STAT TROPOININ, ED    Imaging Review No results found. I have personally reviewed and evaluated these images and lab results as part of my medical decision-making.   EKG Interpretation   Date/Time:  Saturday January 19 2016 14:12:19 EDT Ventricular Rate:  71 PR Interval:  149 QRS Duration: 90 QT Interval:  354 QTC Calculation: 385 R Axis:   71 Text Interpretation:  Sinus rhythm nomal axis normal intervals  No old  tracing to compare No acute changes Confirmed by Rhunette CroftNANAVATI, MD, Janey GentaANKIT  [phone]) on 01/19/2016 6:10:48 PM      MDM   Final diagnoses:  Anxiety  Heart palpitations    Differential diagnosis includes: ACS syndrome CHF exacerbation Valvular disorder Myocarditis Pericarditis Pericardial effusion Pneumonia Pleural effusion Pulmonary edema PE Anemia Musculoskeletal pain  Pt comes in with cc of chest pain. Pressure like, with radiation to the L side. Outside of that, nothing else is typical. HEAR score is 1 for the history. We will get trops x 2.  Pt has no hx of PE, DVT and denies any exogenous estrogen use, long distance travels or surgery in the past 6 weeks, active cancer, recent immobilization.  Pt reports improvement with valium - and with palpitations, anxiety component possible. Pain also positional, but no pericarditis per EKG.    Derwood KaplanAnkit Vihaan Gloss, MD 01/23/16 1315

## 2016-02-08 ENCOUNTER — Emergency Department (HOSPITAL_COMMUNITY)
Admission: EM | Admit: 2016-02-08 | Discharge: 2016-02-08 | Disposition: A | Discharge status: Home / Self Care | Payer: 59 | Attending: Emergency Medicine | Admitting: Emergency Medicine

## 2016-02-08 ENCOUNTER — Encounter (HOSPITAL_COMMUNITY): Payer: Self-pay | Admitting: *Deleted

## 2016-02-08 ENCOUNTER — Emergency Department (HOSPITAL_COMMUNITY): Payer: 59

## 2016-02-08 DIAGNOSIS — S63642A Sprain of metacarpophalangeal joint of left thumb, initial encounter: Secondary | ICD-10-CM

## 2016-02-08 DIAGNOSIS — F419 Anxiety disorder, unspecified: Secondary | ICD-10-CM | POA: Insufficient documentation

## 2016-02-08 DIAGNOSIS — R51 Headache: Secondary | ICD-10-CM | POA: Diagnosis not present

## 2016-02-08 DIAGNOSIS — S5332XA Traumatic rupture of left ulnar collateral ligament, initial encounter: Secondary | ICD-10-CM

## 2016-02-08 DIAGNOSIS — M25532 Pain in left wrist: Secondary | ICD-10-CM | POA: Diagnosis present

## 2016-02-08 DIAGNOSIS — R110 Nausea: Secondary | ICD-10-CM | POA: Diagnosis not present

## 2016-02-08 DIAGNOSIS — Z862 Personal history of diseases of the blood and blood-forming organs and certain disorders involving the immune mechanism: Secondary | ICD-10-CM | POA: Diagnosis not present

## 2016-02-08 MED ORDER — KETOROLAC TROMETHAMINE 60 MG/2ML IM SOLN
60.0000 mg | Freq: Once | INTRAMUSCULAR | Status: AC
  Administered 2016-02-08: 60 mg via INTRAMUSCULAR
  Filled 2016-02-08: qty 2

## 2016-02-08 MED ORDER — NAPROXEN 500 MG PO TABS: 500.0000 mg | ORAL_TABLET | Freq: Two times a day (BID) | ORAL | Status: DC

## 2016-02-08 NOTE — ED Notes (Signed)
PT C/O LEFT WRIST PAIN AND LIMITED MOBILITY AFTER MOVING A REFRIGERATOR

## 2016-02-08 NOTE — ED Provider Notes (Signed)
CSN: [id number]     Arrival date & time 02/08/16  0636 History   First MD Initiated Contact with Patient 02/08/16 [phone]     Chief Complaint  Patient presents with  . Arm Injury    WRIST     (Consider location/radiation/quality/duration/timing/severity/associated sxs/prior Treatment) HPI Lifting refrigerator yesterday and felt sudden pain in left wrist.  Work 3rd shift and was not able to go in secondary to pain. Refrigerator did not fall on him.  Left thumb tingling, feels left finger swollen.  Pain with movement, severe pain.    Past Medical History  Diagnosis Date  . Allergic angiitis (HCC)   . Multiple allergies   . Sickle cell trait (HCC)   . Anxiety   . Allergy   . Headache 12/26/2015   History reviewed. No pertinent past surgical history. Family History  Problem Relation Age of Onset  . CAD Other   . Hypertension Other   . Diabetes Other   . Hypertension Mother   . Hypertension Maternal Grandmother   . Hypertension Paternal Grandmother    Social History  Substance Use Topics  . Smoking status: Never Smoker   . Smokeless tobacco: Never Used  . Alcohol Use: No    Review of Systems  Constitutional: Positive for chills. Negative for fever.  HENT: Negative for sore throat.   Eyes: Negative for visual disturbance.  Respiratory: Negative for shortness of breath.   Cardiovascular: Negative for chest pain.  Gastrointestinal: Positive for nausea. Negative for abdominal pain.  Genitourinary: Negative for difficulty urinating.  Musculoskeletal: Positive for arthralgias. Negative for back pain and neck stiffness.  Skin: Negative for rash.  Neurological: Positive for headaches. Negative for syncope.      Allergies  Fish allergy and Tramadol  Home Medications   Prior to Admission medications   Medication Sig Start Date End Date Taking? Authorizing Provider  Aspirin-Salicylamide-Caffeine (BC HEADACHE POWDER PO) Take 1 packet by mouth every 8 (eight) hours as needed  (for pain).   Yes Historical Provider, MD  busPIRone (BUSPAR) 15 MG tablet Take 0.5-1 tablets (7.5-15 mg total) by mouth 3 (three) times daily. Patient not taking: Reported on 01/19/2016 12/04/15   Ofilia NeasMichael L Clark, PA-C  diazepam (VALIUM) 5 MG tablet Take 0.5 tablets (2.5 mg total) by mouth every 6 (six) hours as needed for anxiety. Patient not taking: Reported on 02/08/2016 01/19/16   Derwood KaplanAnkit Nanavati, MD  escitalopram (LEXAPRO) 10 MG tablet Take 1 tablet (10 mg total) by mouth daily. Patient not taking: Reported on 02/08/2016 12/04/15   Ofilia NeasMichael L Clark, PA-C  naproxen (NAPROSYN) 500 MG tablet Take 1 tablet (500 mg total) by mouth 2 (two) times daily with a meal. 02/08/16   Alvira MondayErin Celso Granja, MD  ondansetron (ZOFRAN ODT) 4 MG disintegrating tablet Take 1 tablet (4 mg total) by mouth every 8 (eight) hours as needed for nausea or vomiting. Patient not taking: Reported on 02/08/2016 12/11/15   Briscoe DeutscherBryan R Hess, DO  propranolol (INDERAL) 10 MG tablet One tablet twice a day for 1 week, then take 2 tablets twice a day Patient not taking: Reported on 01/19/2016 12/26/15   York Spanielharles K Willis, MD  SUMAtriptan (IMITREX) 50 MG tablet Take 1 tablet (50 mg total) by mouth once. May repeat in 2 hours if headache persists or recurs. Patient not taking: Reported on 02/08/2016 12/04/15   Ofilia NeasMichael L Clark, PA-C   BP 130/81 mmHg  Pulse 98  Temp(Src) 98.2 F (36.8 C) (Oral)  Resp 18  Ht 5\' 7"  (  1.702 m)  Wt 180 lb (81.647 kg)  BMI 28.19 kg/m2  SpO2 100% Physical Exam  Constitutional: He is oriented to person, place, and time. He appears well-developed and well-nourished. No distress.  HENT:  Head: Normocephalic and atraumatic.  Eyes: Conjunctivae and EOM are normal.  Neck: Normal range of motion.  Cardiovascular: Normal rate, regular rhythm, normal heart sounds and intact distal pulses.  Exam reveals no gallop and no friction rub.   No murmur heard. Pulmonary/Chest: Effort normal and breath sounds normal. No respiratory distress. He  has no wheezes. He has no rales.  Abdominal: Soft. He exhibits no distension. There is no tenderness. There is no guarding.  Musculoskeletal: He exhibits no edema.       Left wrist: He exhibits tenderness (radial portion of hand, scaphoid base of thumb tenderness). He exhibits normal range of motion (reports pain with ROM).       Left hand: He exhibits normal capillary refill. Normal sensation noted. Decreased sensation is not present in the ulnar distribution and is not present in the medial redistribution. Normal strength noted. He exhibits no finger abduction, no thumb/finger opposition and no wrist extension trouble.  Pt holding wrist in extension bilaterally, concerned for increased protrusion of left carpal bones in comparison to right, very mild increase on left, unknown if this is asymmetric at baseline   Neurological: He is alert and oriented to person, place, and time.  Skin: Skin is warm and dry. He is not diaphoretic.  Nursing note and vitals reviewed.   ED Course  Procedures (including critical care time) Labs Review Labs Reviewed - No data to display  Imaging Review Dg Wrist Complete Left  02/08/2016  CLINICAL DATA:  25 year old male with left wrist pain after injuring it while moving a refrigerator yesterday EXAM: LEFT WRIST - COMPLETE 3+ VIEW COMPARISON:  None. FINDINGS: There is no evidence of fracture or dislocation. There is no evidence of arthropathy or other focal bone abnormality. Soft tissues are unremarkable. IMPRESSION: Negative. Electronically Signed   By: Malachy Moan M.D.   On: 02/08/2016 07:51   I have personally reviewed and evaluated these images and lab results as part of my medical decision-making.   EKG Interpretation None      MDM   Final diagnoses:  Left wrist pain  Possible gamekeeper's thumb, snuff box tenderness   25 year old male with a history of allergies, sickle cell trait, anxiety, and headaches presents with concern for left wrist  pain after trying to lift a refrigerator, and the weight of the refrigerator falling onto his left hand.  Patient is neurovascularly intact, with mild tingling to his left thumb. X-ray shows no sign of fracture or dislocation. Patient has snuffbox tenderness, however given mechanism, have low suspicion for scaphoid fracture. Mechanism and area pain may be consistent with ligamentous strain, or gamekeeper's thumb. Patient was placed in a removable thumb spica splint, recommended to follow up with hand surgery if his pain persists. Provided a work note for light duty until he improves or until instructed by other physician. Given prescription for naproxen for pain. Patient discharged in stable condition with understanding of reasons to return.   Alvira Monday, MD 02/08/16 [phone]

## 2016-02-08 NOTE — Progress Notes (Signed)
Orthopedic Tech Progress Note Patient Details:  Larry Estes [birth year] [id number]  Ortho Devices Type of Ortho Device: Thumb velcro splint Ortho Device/Splint Location: lue Ortho Device/Splint Interventions: Application   Naif Alabi 02/08/2016, 8:13 AM

## 2016-02-08 NOTE — Discharge Instructions (Signed)

## 2016-02-19 ENCOUNTER — Telehealth: Payer: Self-pay

## 2016-02-19 NOTE — Telephone Encounter (Signed)
Larry Estes,  Patient is requesting the name and number of a physiatrist so he can continue on the medication.   [phone]

## 2016-02-25 ENCOUNTER — Telehealth: Payer: Self-pay | Admitting: *Deleted

## 2016-02-25 DIAGNOSIS — G8929 Other chronic pain: Secondary | ICD-10-CM

## 2016-02-25 DIAGNOSIS — R51 Headache: Principal | ICD-10-CM

## 2016-02-25 MED ORDER — TOPIRAMATE 25 MG PO TABS: ORAL_TABLET | ORAL | Status: DC

## 2016-02-25 NOTE — Telephone Encounter (Signed)
I have called at least 4 times a day, the line is either busy or it will ring and the patient does not answer. I will continue to try to reach the patient.

## 2016-02-25 NOTE — Telephone Encounter (Signed)
I called patient. The patient indicates that his headaches have continued, he has not been able to tolerate the propranolol indicating this actually increased his blood pressure. He is having increasing problems with concentration and confusion. We will check MRI the brain, I'll switch him to Topamax.

## 2016-02-25 NOTE — Telephone Encounter (Signed)
Patient returned Dr. Clarisa KindredWillis's call. Please call [phone].

## 2016-02-26 NOTE — Telephone Encounter (Signed)
Please call him and provide a name and number for a psychiatrist. Deliah BostonMichael Antonia Culbertson, MS, PA-C 9:26 PM, 02/26/2016

## 2016-02-28 NOTE — Telephone Encounter (Signed)
Called patient and gave him the information for [facility] for him to call and schedule  Patient said to thank [name].

## 2016-03-03 ENCOUNTER — Encounter (HOSPITAL_COMMUNITY): Payer: Self-pay | Admitting: Emergency Medicine

## 2016-03-03 ENCOUNTER — Emergency Department (HOSPITAL_COMMUNITY)
Admission: EM | Admit: 2016-03-03 | Discharge: 2016-03-04 | Disposition: A | Discharge status: Home / Self Care | Payer: 59 | Attending: Emergency Medicine | Admitting: Emergency Medicine

## 2016-03-03 ENCOUNTER — Emergency Department (HOSPITAL_COMMUNITY): Payer: 59

## 2016-03-03 DIAGNOSIS — R0602 Shortness of breath: Secondary | ICD-10-CM | POA: Diagnosis not present

## 2016-03-03 DIAGNOSIS — Z79899 Other long term (current) drug therapy: Secondary | ICD-10-CM | POA: Diagnosis not present

## 2016-03-03 DIAGNOSIS — F410 Panic disorder [episodic paroxysmal anxiety] without agoraphobia: Secondary | ICD-10-CM | POA: Diagnosis not present

## 2016-03-03 DIAGNOSIS — Z7982 Long term (current) use of aspirin: Secondary | ICD-10-CM | POA: Insufficient documentation

## 2016-03-03 DIAGNOSIS — F419 Anxiety disorder, unspecified: Secondary | ICD-10-CM | POA: Diagnosis present

## 2016-03-03 LAB — CBC WITH DIFFERENTIAL/PLATELET
Basophils Absolute: 0 10*3/uL (ref 0.0–0.1)
Basophils Relative: 0 %
EOS PCT: 2 %
Eosinophils Absolute: 0.1 10*3/uL (ref 0.0–0.7)
HCT: 44.2 % (ref 39.0–52.0)
HEMOGLOBIN: 15.8 g/dL (ref 13.0–17.0)
LYMPHS ABS: 1 10*3/uL (ref 0.7–4.0)
LYMPHS PCT: 21 %
MCH: 29 pg (ref 26.0–34.0)
MCHC: 35.7 g/dL (ref 30.0–36.0)
MCV: 81.3 fL (ref 78.0–100.0)
MONOS PCT: 9 %
Monocytes Absolute: 0.5 10*3/uL (ref 0.1–1.0)
Neutro Abs: 3.3 10*3/uL (ref 1.7–7.7)
Neutrophils Relative %: 68 %
Platelets: 254 10*3/uL (ref 150–400)
RBC: 5.44 MIL/uL (ref 4.22–5.81)
RDW: 12.9 % (ref 11.5–15.5)
WBC: 4.9 10*3/uL (ref 4.0–10.5)

## 2016-03-03 LAB — BASIC METABOLIC PANEL
Anion gap: 9 (ref 5–15)
BUN: 8 mg/dL (ref 6–20)
CHLORIDE: 106 mmol/L (ref 101–111)
CO2: 26 mmol/L (ref 22–32)
Calcium: 10 mg/dL (ref 8.9–10.3)
Creatinine, Ser: 1.08 mg/dL (ref 0.61–1.24)
GFR calc Af Amer: >60 mL/min (ref >60)
GFR calc non Af Amer: >60 mL/min (ref >60)
GLUCOSE: 97 mg/dL (ref 65–99)
POTASSIUM: 3.8 mmol/L (ref 3.5–5.1)
Sodium: 141 mmol/L (ref 135–145)

## 2016-03-03 MED ORDER — DIAZEPAM 5 MG PO TABS
5.0000 mg | ORAL_TABLET | Freq: Once | ORAL | Status: AC
  Administered 2016-03-03: 5 mg via ORAL
  Filled 2016-03-03: qty 1

## 2016-03-03 NOTE — ED Notes (Signed)
Pt reports having shortness of breath, chest pain and shaking limbs that started at 2000 tonight. Pt reports having history of anxiety and has been out of anxiety medication for the last month.

## 2016-03-03 NOTE — ED Notes (Addendum)
Pt called EMS out today after feeling SOB secondary to anxiety today. Calm now after receiving oxygen in ambulance. 100% RA. Also c/o chest discomfort at this time. Alert and oriented. Speaking in complete sentences.

## 2016-03-03 NOTE — ED Provider Notes (Signed)
CSN: [id number]     Arrival date & time 03/03/16  2126 History   First MD Initiated Contact with Patient 03/03/16 2232     Chief Complaint  Patient presents with  . Shortness of Breath     (Consider location/radiation/quality/duration/timing/severity/associated sxs/prior Treatment) HPI   Patient is a 25 year old male with past medical history of anxiety who presents to the ED with complaint of anxiety attack, onset 9 PM. Patient reports while he was at home he began to have sudden onset of Shortness of breath, racing heart, chest tightness, nausea, lightheadedness and shaking of his upper and lower limbs. She notes the symptoms persisted until he arrived to the ED. He endorses having one episode of NBNB vomiting upon arrival to the ED. Patient reports he has had similar episodes in the past related to his anxiety. He states he usually takes Valium for his anxiety but notes he has been out of his medications for the past 3 weeks. Patient reports his primary care provider is currently out of town resulting in him being unable to refill his prescription. Patient reports since being placed in a room in the ED his symptoms have significantly improved and he no longer feels short of breath. Denies fever, chills, headache, visual changes, cough, wheezing, chest pain, abdominal pain, diarrhea, urinary symptoms, numbness, tingling, weakness, leg swelling.  PCP Dr. Deliah Boston  Past Medical History  Diagnosis Date  . Allergic angiitis (HCC)   . Multiple allergies   . Sickle cell trait (HCC)   . Anxiety   . Allergy   . Headache 12/26/2015   History reviewed. No pertinent past surgical history. Family History  Problem Relation Age of Onset  . CAD Other   . Hypertension Other   . Diabetes Other   . Hypertension Mother   . Hypertension Maternal Grandmother   . Hypertension Paternal Grandmother    Social History  Substance Use Topics  . Smoking status: Never Smoker   . Smokeless tobacco:  Never Used  . Alcohol Use: No    Review of Systems  Respiratory: Positive for chest tightness and shortness of breath.   Cardiovascular: Positive for palpitations.  Gastrointestinal: Positive for nausea.  Neurological: Positive for tremors and light-headedness.  All other systems reviewed and are negative.     Allergies  Fish allergy and Tramadol  Home Medications   Prior to Admission medications   Medication Sig Start Date End Date Taking? Authorizing Provider  Aspirin-Salicylamide-Caffeine (BC HEADACHE POWDER PO) Take 1 packet by mouth every 8 (eight) hours as needed (for pain).   Yes Historical Provider, MD  escitalopram (LEXAPRO) 10 MG tablet Take 1 tablet (10 mg total) by mouth daily. 12/04/15  Yes Ofilia Neas, PA-C  naproxen (NAPROSYN) 500 MG tablet Take 1 tablet (500 mg total) by mouth 2 (two) times daily with a meal. 02/08/16  Yes Alvira Monday, MD  ondansetron (ZOFRAN ODT) 4 MG disintegrating tablet Take 1 tablet (4 mg total) by mouth every 8 (eight) hours as needed for nausea or vomiting. 12/11/15  Yes Bryan R Hess, DO  topiramate (TOPAMAX) 25 MG tablet Take one tablet at night for one week, then take 2 tablets at night for one week, then take 3 tablets at night. 02/25/16  Yes York Spaniel, MD  busPIRone (BUSPAR) 15 MG tablet Take 0.5-1 tablets (7.5-15 mg total) by mouth 3 (three) times daily. Patient not taking: Reported on 01/19/2016 12/04/15   Ofilia Neas, PA-C  diazepam (VALIUM) 5 MG tablet  Take 1 tablet (5 mg total) by mouth every 12 (twelve) hours as needed for anxiety. 03/04/16   Barrett Henle, PA-C  SUMAtriptan (IMITREX) 50 MG tablet Take 1 tablet (50 mg total) by mouth once. May repeat in 2 hours if headache persists or recurs. Patient not taking: Reported on 02/08/2016 12/04/15   Ofilia Neas, PA-C   BP 131/81 mmHg  Pulse 67  Temp(Src) 97.9 F (36.6 C) (Oral)  Resp 22  SpO2 100% Physical Exam  Constitutional: He is oriented to person, place,  and time. He appears well-developed and well-nourished.  Pt appears mildly anxious  HENT:  Head: Normocephalic and atraumatic.  Right Ear: Tympanic membrane normal.  Left Ear: Tympanic membrane normal.  Mouth/Throat: Uvula is midline, oropharynx is clear and moist and mucous membranes are normal. No oropharyngeal exudate, posterior oropharyngeal edema, posterior oropharyngeal erythema or tonsillar abscesses.  Eyes: Conjunctivae and EOM are normal. Pupils are equal, round, and reactive to light. Right eye exhibits no discharge. Left eye exhibits no discharge. No scleral icterus.  Neck: Normal range of motion. Neck supple.  Cardiovascular: Normal rate, regular rhythm, normal heart sounds and intact distal pulses.   Pulmonary/Chest: Effort normal and breath sounds normal. No respiratory distress. He has no wheezes. He has no rales. He exhibits no tenderness.  Abdominal: Soft. Bowel sounds are normal. He exhibits no distension and no mass. There is no tenderness. There is no rebound and no guarding.  Musculoskeletal: Normal range of motion. He exhibits no edema or tenderness.  Lymphadenopathy:    He has no cervical adenopathy.  Neurological: He is alert and oriented to person, place, and time.  Skin: Skin is warm and dry.  Nursing note and vitals reviewed.   ED Course  Procedures (including critical care time) Labs Review Labs Reviewed  CBC WITH DIFFERENTIAL/PLATELET  BASIC METABOLIC PANEL    Imaging Review Dg Chest 2 View  03/03/2016  CLINICAL DATA:  Shortness of breath and chest pain. Symptom onset tonight. EXAM: CHEST  2 VIEW COMPARISON:  01/19/2016 FINDINGS: The cardiomediastinal contours are normal. The lungs are clear. Pulmonary vasculature is normal. No consolidation, pleural effusion, or pneumothorax. No acute osseous abnormalities are seen. IMPRESSION: No acute pulmonary process. Electronically Signed   By: Rubye Oaks M.D.   On: 03/03/2016 23:45   I have personally reviewed  and evaluated these images and lab results as part of my medical decision-making.   EKG Interpretation   Date/Time:  Monday Mar 03 2016 21:39:58 EDT Ventricular Rate:  58 PR Interval:  154 QRS Duration: 90 QT Interval:  362 QTC Calculation: 355 R Axis:   71 Text Interpretation:  Sinus rhythm Baseline wander in lead(s) V2 Confirmed  by Lincoln Brigham [phone]) on 03/03/2016 9:44:10 PM      MDM   Final diagnoses:  Panic attack    Patient presents to the emergency department complaining of symptoms consistent with anxiety/panic attack.  Patient has a history of same with similar episodes. Patient reports he has been out of his prescription of Valium for the past 3 weeks. The patient is resting comfortably, in no apparent distress and asymptomatic.  Labs, ECG and vital signs reviewed. Pt given home dose of valium in the ED and reports his sxs have resolved. Stress reducing mechanisms discussed including caffeine intake.  Advised patient to follow up with primary care provider this week for further evaluation and management of anxiety.  Discharged with a 3 day prescription for Valium 5mg  (home dose).  Satira Sarkicole Elizabeth CampbelltonNadeau, [place] JerseyPA-C 03/04/16 0029  Laurence Spatesachel Morgan Little, MD 03/04/16 [phone]

## 2016-03-04 MED ORDER — DIAZEPAM 5 MG PO TABS: 5.0000 mg | ORAL_TABLET | Freq: Two times a day (BID) | ORAL | Status: DC | PRN

## 2016-03-04 NOTE — Discharge Instructions (Signed)
Take your medications as prescribed as needed for anxiety/panic attack. Call your primary care provider's office tomorrow morning to schedule a follow-up appointment to be seen this week for further evaluation and management of your anxiety. Please return to the Emergency Department if symptoms worsen or new onset of fever, headache, cough, difficulty breathing, wheezing, chest pain, vomiting, abdominal pain, lightheadedness, dizziness, numbness, tingling, weakness, syncope.

## 2016-03-04 NOTE — ED Notes (Signed)
Pt reports understanding of discharge information. No questions at time of discharge 

## 2016-03-05 ENCOUNTER — Other Ambulatory Visit: Payer: Self-pay | Admitting: Physician Assistant

## 2016-03-06 ENCOUNTER — Telehealth: Payer: Self-pay

## 2016-03-06 NOTE — Telephone Encounter (Signed)
I have had this conversation with Mr. Larry Estes in the past.  I am not comfortable with keeping him on a controlled substance.  If he would like I will be happy to increase his SSRI which has helped his symptoms in the past.  He will need to see a psychiatrist for benzodiazepines. Deliah BostonMichael Jamarie Joplin, MS, PA-C 12:50 PM, 03/06/2016

## 2016-03-06 NOTE — Telephone Encounter (Signed)
Pt states he can not get in to his psycologist until July because his insurance will not cover a visit until then and he needs michael clark to refill his diazepan  Best number [phone]

## 2016-03-06 NOTE — Telephone Encounter (Signed)
Called unable to leave message

## 2016-03-12 ENCOUNTER — Ambulatory Visit (INDEPENDENT_AMBULATORY_CARE_PROVIDER_SITE_OTHER): Payer: 59

## 2016-03-12 DIAGNOSIS — R51 Headache: Secondary | ICD-10-CM | POA: Diagnosis not present

## 2016-03-12 DIAGNOSIS — R519 Headache, unspecified: Secondary | ICD-10-CM

## 2016-03-14 ENCOUNTER — Telehealth: Payer: Self-pay | Admitting: Neurology

## 2016-03-14 NOTE — Telephone Encounter (Signed)
I called the patient. MRI the brain is normal. The patient is not on Topamax on a regular basis. He is concerned about blood pressure spikes, claiming that propranolol caused his blood pressure to go up. What is happening is that he is checking his blood pressure as he is going into a headache and his blood pressure is going up with the event. If he takes his blood pressure when he is feeling well, he is running about 116 systolic blood pressure. The patient needs to get back on Topamax, we will check back in about 6 weeks in the office.   MRI brain 03/13/16:  IMPRESSION:  Normal MRI brain (without).

## 2016-03-31 ENCOUNTER — Ambulatory Visit: Payer: 59 | Admitting: Adult Health

## 2016-04-18 ENCOUNTER — Other Ambulatory Visit: Payer: Self-pay

## 2016-04-18 DIAGNOSIS — Z8659 Personal history of other mental and behavioral disorders: Secondary | ICD-10-CM

## 2016-04-18 MED ORDER — ESCITALOPRAM OXALATE 10 MG PO TABS: 10.0000 mg | ORAL_TABLET | Freq: Every day | ORAL | Status: DC

## 2016-04-27 ENCOUNTER — Emergency Department (HOSPITAL_COMMUNITY)
Admission: EM | Admit: 2016-04-27 | Discharge: 2016-04-27 | Disposition: A | Discharge status: Home / Self Care | Payer: 59 | Attending: Emergency Medicine | Admitting: Emergency Medicine

## 2016-04-27 ENCOUNTER — Encounter (HOSPITAL_COMMUNITY): Payer: Self-pay | Admitting: Emergency Medicine

## 2016-04-27 DIAGNOSIS — F419 Anxiety disorder, unspecified: Secondary | ICD-10-CM | POA: Insufficient documentation

## 2016-04-27 DIAGNOSIS — R002 Palpitations: Secondary | ICD-10-CM | POA: Diagnosis not present

## 2016-04-27 DIAGNOSIS — Z79899 Other long term (current) drug therapy: Secondary | ICD-10-CM | POA: Diagnosis not present

## 2016-04-27 MED ORDER — DIAZEPAM 5 MG PO TABS
5.0000 mg | ORAL_TABLET | Freq: Once | ORAL | Status: AC
  Administered 2016-04-27: 5 mg via ORAL
  Filled 2016-04-27: qty 1

## 2016-04-27 NOTE — ED Triage Notes (Addendum)
Pt presents with c/o intermittent anxiety and palpitations recently, pt states he has EMS to his home several times for assessment. 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 Pt transported POV by mother. Mother very anxious in waiting area, yelling at staff. Pt states he did drink small amount of etoh last evening, took Benadryl 1 tab @0430  then Zyprexa x 1.

## 2016-04-27 NOTE — ED Provider Notes (Signed)
[facility]-EMERGENCY DEPT Provider Note   CSN: [id number] Arrival date & time: 04/27/16  9935  First Provider Contact:  None       History   Chief Complaint Chief Complaint  Patient presents with  . Anxiety    HPI Larry Estes is a 25 y.o. male.  HPI Patient reports palpitations intermittently over the past several months.  He has been seen by cardiology and had an echocardiogram which the patient reports was normal.  This evening he developed palpitations when lying in bed and felt like he could feel his heart beating.  It was not racing.  He had no associated shortness of breath, syncope, near syncope.  He reports that he hits anxious at times and has had panic attacks.  He reports that he was recently prescribed Valium and had some improvement with that is requesting a refill of Valium.  His no chest pain or chest tightness.  No other complaints at this time.    Past Medical History:  Diagnosis Date  . Allergic angiitis (HCC)   . Allergy   . Anxiety   . Headache 12/26/2015  . Multiple allergies   . Sickle cell trait [facility])     Patient Active Problem List   Diagnosis Date Noted  . Headache 12/26/2015  . Myalgia 12/11/2015    History reviewed. No pertinent surgical history.     Home Medications    Prior to Admission medications   Medication Sig Start Date End Date Taking? Authorizing Provider  diazepam (VALIUM) 5 MG tablet Take 1 tablet (5 mg total) by mouth every 12 (twelve) hours as needed for anxiety. 03/04/16  Yes Satira Sark Nadeau, PA-C  escitalopram (LEXAPRO) 10 MG tablet Take 1 tablet (10 mg total) by mouth daily. 04/18/16  Yes Ofilia Neas, PA-C  topiramate (TOPAMAX) 25 MG tablet Take one tablet at night for one week, then take 2 tablets at night for one week, then take 3 tablets at night. Patient taking differently: Take 75 mg by mouth at bedtime.  02/25/16  Yes York Spaniel, MD  busPIRone (BUSPAR) 15 MG tablet Take 0.5-1 tablets (7.5-15 mg total)  by mouth 3 (three) times daily. Patient not taking: Reported on 01/19/2016 12/04/15   Ofilia Neas, PA-C  naproxen (NAPROSYN) 500 MG tablet Take 1 tablet (500 mg total) by mouth 2 (two) times daily with a meal. Patient not taking: Reported on 04/27/2016 02/08/16   Alvira Monday, MD  ondansetron (ZOFRAN ODT) 4 MG disintegrating tablet Take 1 tablet (4 mg total) by mouth every 8 (eight) hours as needed for nausea or vomiting. Patient not taking: Reported on 04/27/2016 12/11/15   Briscoe Deutscher, DO  SUMAtriptan (IMITREX) 50 MG tablet Take 1 tablet (50 mg total) by mouth once. May repeat in 2 hours if headache persists or recurs. Patient not taking: Reported on 02/08/2016 12/04/15   Ofilia Neas, PA-C    Family History Family History  Problem Relation Age of Onset  . Hypertension Mother   . Hypertension Maternal Grandmother   . Hypertension Paternal Grandmother   . CAD Other   . Hypertension Other   . Diabetes Other     Social History Social History  Substance Use Topics  . Smoking status: Never Smoker  . Smokeless tobacco: Never Used  . Alcohol use Yes     Allergies   Fish allergy and Tramadol   Review of Systems Review of Systems  All other systems reviewed and are negative.  Physical Exam Updated Vital Signs BP 178/93 (BP Location: Right Arm)   Pulse 117   Temp 98.2 F (36.8 C) (Oral)   Resp 20   Ht  (1.702 m)   Wt 190 lb (86.2 kg)   SpO2 100%   BMI 29.76 kg/m   Physical Exam  Constitutional: He is oriented to person, place, and time.  HENT:  Head: Normocephalic and atraumatic.  Eyes: EOM are normal.  Neck: Normal range of motion.  Cardiovascular: Normal rate, regular rhythm and intact distal pulses.   Pulmonary/Chest: Effort normal and breath sounds normal. No respiratory distress.  Abdominal: He exhibits no distension. There is no tenderness.  Musculoskeletal: Normal range of motion.  Neurological: He is alert and oriented to person, place, and time.    Skin: Skin is warm and dry.  Nursing note and vitals reviewed.    ED Treatments / Results  Labs (all labs ordered are listed, but only abnormal results are displayed) Labs Reviewed - No data to display  EKG  EKG Interpretation  Date/Time:  Sunday April 27 2016 06:25:56 EDT Ventricular Rate:  71 PR Interval:    QRS Duration: 91 QT Interval:  361 QTC Calculation: 393 R Axis:   71 Text Interpretation:  Sinus rhythm No significant change was found Confirmed by Read Drivers  MD, Jonny Ruiz ([id number]) on 04/27/2016 6:31:14 AM       Radiology No results found.  Procedures Procedures (including critical care time)  Medications Ordered in ED Medications - No data to display   Initial Impression / Assessment and Plan / ED Course  I have reviewed the triage vital signs and the nursing notes.  Pertinent labs & imaging results that were available during my care of the patient were reviewed by me and considered in my medical decision making (see chart for details).  Clinical Course    EKG is normal sinus rhythm.  Patient has anxiety.  He was given several doses of Valium here.  I recommended they see a therapist and doctors primary care physician about ongoing prescriptions for benzodiazepines if he thinks that that helped.  Overall the patient is well-appearing.    Final Clinical Impressions(s) / ED Diagnoses   Final diagnoses:  Anxiety  Palpitation    New Prescriptions New Prescriptions   No medications on file     Azalia Bilis, MD 04/27/16 1006

## 2016-05-15 ENCOUNTER — Ambulatory Visit: Payer: Self-pay | Admitting: Adult Health

## 2016-05-15 ENCOUNTER — Telehealth: Payer: Self-pay | Admitting: *Deleted

## 2016-05-15 NOTE — Telephone Encounter (Signed)
No showed follow up appt. 

## 2016-05-16 ENCOUNTER — Encounter: Payer: Self-pay | Admitting: Adult Health

## 2016-06-09 ENCOUNTER — Encounter (HOSPITAL_COMMUNITY): Payer: Self-pay | Admitting: Emergency Medicine

## 2016-06-09 ENCOUNTER — Ambulatory Visit (HOSPITAL_COMMUNITY)
Admission: EM | Admit: 2016-06-09 | Discharge: 2016-06-09 | Disposition: A | Discharge status: Home / Self Care | Payer: 59 | Attending: Radiology | Admitting: Radiology

## 2016-06-09 DIAGNOSIS — F419 Anxiety disorder, unspecified: Secondary | ICD-10-CM

## 2016-06-09 DIAGNOSIS — Z760 Encounter for issue of repeat prescription: Secondary | ICD-10-CM

## 2016-06-09 MED ORDER — DIAZEPAM 5 MG PO TABS: 5.0000 mg | ORAL_TABLET | Freq: Two times a day (BID) | ORAL | 0 refills | Status: DC | PRN

## 2016-06-09 NOTE — Discharge Instructions (Signed)
Please call the number provided in the avs for help finding a primary care provider

## 2016-06-09 NOTE — ED Triage Notes (Signed)
[facility] with [facility] to refill his anxiety medications. The patient stated that his PCP retired and he has been out of his medication for 1 month. The patient reported increased anxiety recently.

## 2016-06-09 NOTE — ED Provider Notes (Signed)
CSN: [id number]     Arrival date & time 06/09/16  1153 History   None    Chief Complaint  Patient presents with  . Medication Refill   (Consider location/radiation/quality/duration/timing/severity/associated sxs/prior Treatment) 25 y.o. male presents with anxiety related symptoms X 2 weeks. Patient describes himself being under a lot of stress at work and is having some family issues. Patient reports tachycardia, SHOB and tingling in his fingers. Patient denies any recent illness, fevers, chest or respiratory distress . Condition is chronic in nature. Condition is made better by anxiety medication. Condition is made worse by stress, work and family issues.. Patient denies any treatment prior to there arrival at this facility. Patient states that he no longer has a PCP and is requesting a refill on anxiety medication while he sets up care with a new provider       Past Medical History:  Diagnosis Date  . Allergic angiitis (HCC)   . Allergy   . Anxiety   . Headache 12/26/2015  . Multiple allergies   . Sickle cell trait (HCC)    History reviewed. No pertinent surgical history. Family History  Problem Relation Age of Onset  . Hypertension Mother   . Hypertension Maternal Grandmother   . Hypertension Paternal Grandmother   . CAD Other   . Hypertension Other   . Diabetes Other    Social History  Substance Use Topics  . Smoking status: Never Smoker  . Smokeless tobacco: Never Used  . Alcohol use Yes    Review of Systems  Allergies  Fish allergy and Tramadol  Home Medications   Prior to Admission medications   Medication Sig Start Date End Date Taking? Authorizing Provider  busPIRone (BUSPAR) 15 MG tablet Take 0.5-1 tablets (7.5-15 mg total) by mouth 3 (three) times daily. Patient not taking: Reported on 01/19/2016 12/04/15   Ofilia NeasMichael L Clark, PA-C  diazepam (VALIUM) 5 MG tablet Take 1 tablet (5 mg total) by mouth every 12 (twelve) hours as needed for anxiety. 06/09/16   Alene MiresJennifer  C Casie Sturgeon, NP  escitalopram (LEXAPRO) 10 MG tablet Take 1 tablet (10 mg total) by mouth daily. 04/18/16   Ofilia NeasMichael L Clark, PA-C  naproxen (NAPROSYN) 500 MG tablet Take 1 tablet (500 mg total) by mouth 2 (two) times daily with a meal. Patient not taking: Reported on 04/27/2016 02/08/16   Alvira MondayErin Schlossman, MD  ondansetron (ZOFRAN ODT) 4 MG disintegrating tablet Take 1 tablet (4 mg total) by mouth every 8 (eight) hours as needed for nausea or vomiting. Patient not taking: Reported on 04/27/2016 12/11/15   Briscoe DeutscherBryan R Hess, DO  SUMAtriptan (IMITREX) 50 MG tablet Take 1 tablet (50 mg total) by mouth once. May repeat in 2 hours if headache persists or recurs. Patient not taking: Reported on 02/08/2016 12/04/15   Ofilia NeasMichael L Clark, PA-C  topiramate (TOPAMAX) 25 MG tablet Take one tablet at night for one week, then take 2 tablets at night for one week, then take 3 tablets at night. Patient taking differently: Take 75 mg by mouth at bedtime.  02/25/16   York Spanielharles K Willis, MD   Meds Ordered and Administered this Visit  Medications - No data to display  BP 137/70 (BP Location: Left Arm)   Pulse 70   Temp 98.3 F (36.8 C) (Oral)   Resp 16   SpO2 100%  No data found.   Physical Exam  Urgent Care Course   Clinical Course    Procedures (including critical care time)  Labs Review  Labs Reviewed - No data to display  Imaging Review No results found.   Visual Acuity Review  Right Eye Distance:   Left Eye Distance:   Bilateral Distance:    Right Eye Near:   Left Eye Near:    Bilateral Near:         MDM   1. Medication refill       Alene Mires, NP 06/09/16 1406

## 2016-06-20 ENCOUNTER — Encounter (HOSPITAL_COMMUNITY): Payer: Self-pay | Admitting: Emergency Medicine

## 2016-06-20 ENCOUNTER — Ambulatory Visit (HOSPITAL_COMMUNITY)
Admission: EM | Admit: 2016-06-20 | Discharge: 2016-06-20 | Disposition: A | Discharge status: Home / Self Care | Payer: 59 | Attending: Family Medicine | Admitting: Family Medicine

## 2016-06-20 DIAGNOSIS — R0789 Other chest pain: Secondary | ICD-10-CM | POA: Diagnosis not present

## 2016-06-20 DIAGNOSIS — S2341XA Sprain of ribs, initial encounter: Secondary | ICD-10-CM | POA: Diagnosis not present

## 2016-06-20 DIAGNOSIS — S29011A Strain of muscle and tendon of front wall of thorax, initial encounter: Secondary | ICD-10-CM

## 2016-06-20 MED ORDER — METHOCARBAMOL 500 MG PO TABS: 500.0000 mg | ORAL_TABLET | Freq: Two times a day (BID) | ORAL | 0 refills | Status: DC

## 2016-06-20 MED ORDER — DICLOFENAC POTASSIUM 50 MG PO TABS: 50.0000 mg | ORAL_TABLET | Freq: Three times a day (TID) | ORAL | 0 refills | Status: DC

## 2016-06-20 NOTE — Discharge Instructions (Signed)
Your pain is due to muscle strain. There is no indication that you are having a heart attack, heart damage, lung damage, crushed organ, bleeding on the inside or other death producing conditions. The lungs are perfectly clear. Your heart rate in heart sounds are normal. All your vital signs are normal. Apply ice to the area of soreness for the next 2-3 days then switch to heat. Take the medications as directed for pain.

## 2016-06-20 NOTE — ED Provider Notes (Signed)
CSN: [id number]     Arrival date & time 06/20/16  1702 History   First MD Initiated Contact with Patient 06/20/16 2001     Chief Complaint  Patient presents with  . Flank Pain   (Consider location/radiation/quality/duration/timing/severity/associated sxs/prior Treatment) 25 year old mesomorphic male considers himself to be in generally good health and who exercises and lives weights states that he was moving boxes with his job yesterday. The hours later he developed acute pain to the left anterolateral ribs. The pain is worse with movement and taking a deep breath. He has a history of severe anxiety. He is concerned that he is dying, that he may have crushed his organs on the inside and that he is bleeding to death. He is sitting upright on the bed, for syphilis, showing no signs of distress whatsoever although he is quite anxious. His vital signs are perfectly normal. He has been to the emergency department recently for severe anxiety.      Past Medical History:  Diagnosis Date  . Allergic angiitis (HCC)   . Allergy   . Anxiety   . Headache 12/26/2015  . Multiple allergies   . Sickle cell trait (HCC)    History reviewed. No pertinent surgical history. Family History  Problem Relation Age of Onset  . Hypertension Mother   . Hypertension Maternal Grandmother   . Hypertension Paternal Grandmother   . CAD Other   . Hypertension Other   . Diabetes Other    Social History  Substance Use Topics  . Smoking status: Never Smoker  . Smokeless tobacco: Never Used  . Alcohol use Yes    Review of Systems  Constitutional: Negative.   HENT: Negative.   Respiratory: Positive for shortness of breath.   Cardiovascular: Positive for chest pain.  Gastrointestinal: Negative.   Musculoskeletal: Negative for back pain and joint swelling.  Skin: Negative.   Neurological: Negative.   Psychiatric/Behavioral: The patient is nervous/anxious.   All other systems reviewed and are  negative.   Allergies  Fish allergy and Tramadol  Home Medications   Prior to Admission medications   Medication Sig Start Date End Date Taking? Authorizing Provider  diazepam (VALIUM) 5 MG tablet Take 1 tablet (5 mg total) by mouth every 12 (twelve) hours as needed for anxiety. 06/09/16  Yes Alene Mires, NP  topiramate (TOPAMAX) 25 MG tablet Take one tablet at night for one week, then take 2 tablets at night for one week, then take 3 tablets at night. Patient taking differently: Take 75 mg by mouth at bedtime.  02/25/16  Yes York Spaniel, MD  busPIRone (BUSPAR) 15 MG tablet Take 0.5-1 tablets (7.5-15 mg total) by mouth 3 (three) times daily. Patient not taking: Reported on 06/20/2016 12/04/15   Ofilia Neas, PA-C  diclofenac (CATAFLAM) 50 MG tablet Take 1 tablet (50 mg total) by mouth 3 (three) times daily. One tablet TID with food prn pain. 06/20/16   Hayden Rasmussen, NP  escitalopram (LEXAPRO) 10 MG tablet Take 1 tablet (10 mg total) by mouth daily. 04/18/16   Ofilia Neas, PA-C  methocarbamol (ROBAXIN) 500 MG tablet Take 1 tablet (500 mg total) by mouth 2 (two) times daily. For muscle pain. May cause drowsiness. 06/20/16   Hayden Rasmussen, NP  naproxen (NAPROSYN) 500 MG tablet Take 1 tablet (500 mg total) by mouth 2 (two) times daily with a meal. Patient not taking: Reported on 06/20/2016 02/08/16   Alvira Monday, MD  ondansetron (ZOFRAN ODT) 4 MG disintegrating tablet  Take 1 tablet (4 mg total) by mouth every 8 (eight) hours as needed for nausea or vomiting. Patient not taking: Reported on 06/20/2016 12/11/15   Briscoe DeutscherBryan R Hess, DO  SUMAtriptan (IMITREX) 50 MG tablet Take 1 tablet (50 mg total) by mouth once. May repeat in 2 hours if headache persists or recurs. Patient not taking: Reported on 06/20/2016 12/04/15   Ofilia NeasMichael L Clark, PA-C   Meds Ordered and Administered this Visit  Medications - No data to display  BP (!) 123/50 (BP Location: Right Arm) Comment: notified rn  Pulse 71   Temp  98.8 F (37.1 C) (Oral)   Resp 16   SpO2 100%  No data found.   Physical Exam  Constitutional: He is oriented to person, place, and time. He appears well-developed and well-nourished. No distress.  HENT:  Head: Normocephalic and atraumatic.  Eyes: EOM are normal.  Neck: Normal range of motion. Neck supple.  Cardiovascular: Normal rate, regular rhythm, normal heart sounds and intact distal pulses.  Exam reveals no gallop and no friction rub.   No murmur heard. Pulmonary/Chest: Effort normal and breath sounds normal. No respiratory distress. He has no wheezes. He has no rales.  Palpation does not produce tenderness to the left anterolateral ribs. Light percussion does reproduce the tenderness. Patient is able to reproduce the tenderness by movement of the left arm, performing butterfly type movements against resistance and other similar movements that are reproduce the left chest wall pain. Lungs are perfectly clear, lungs with good expansion. No adventitious sounds.  Musculoskeletal: Normal range of motion. He exhibits no edema or deformity.  Neurological: He is alert and oriented to person, place, and time.  Skin: Skin is warm and dry. Capillary refill takes less than 2 seconds. No rash noted.  Psychiatric: His mood appears anxious. His speech is rapid and/or pressured. He is hyperactive. He is not aggressive. Thought content is paranoid. Cognition and memory are normal.  Nursing note and vitals reviewed.   Urgent Care Course   Clinical Course    Procedures (including critical care time)  Labs Review Labs Reviewed - No data to display  Imaging Review No results found.   Visual Acuity Review  Right Eye Distance:   Left Eye Distance:   Bilateral Distance:    Right Eye Near:   Left Eye Near:    Bilateral Near:         MDM   1. Intercostal muscle strain, initial encounter   2. Chest wall pain    Your pain is due to muscle strain. There is no indication that you  are having a heart attack, heart damage, lung damage, crushed organ, bleeding on the inside or other death producing conditions. The lungs are perfectly clear.No direct tenderness to palpation over the individual ribs. Your heart rate in heart sounds are normal. All your vital signs are normal. Apply ice to the area of soreness for the next 2-3 days then switch to heat. Take the medications as directed for pain. Meds ordered this encounter  Medications  . diclofenac (CATAFLAM) 50 MG tablet    Sig: Take 1 tablet (50 mg total) by mouth 3 (three) times daily. One tablet TID with food prn pain.    Dispense:  21 tablet    Refill:  0    Order Specific Question:   Supervising Provider    Answer:   Linna HoffKINDL, JAMES D [phone][[id number]]  . methocarbamol (ROBAXIN) 500 MG tablet    Sig: Take 1 tablet (500  mg total) by mouth 2 (two) times daily. For muscle pain. May cause drowsiness.    Dispense:  20 tablet    Refill:  0    Order Specific Question:   Supervising Provider    Answer:   Linna Hoff [[id number]]       Hayden Rasmussen, NP 06/20/16 2026

## 2016-06-20 NOTE — ED Triage Notes (Signed)
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 A&O x4... NAD

## 2016-06-25 ENCOUNTER — Emergency Department (HOSPITAL_COMMUNITY): Payer: 59

## 2016-06-25 ENCOUNTER — Encounter (HOSPITAL_COMMUNITY): Payer: Self-pay

## 2016-06-25 ENCOUNTER — Emergency Department (HOSPITAL_COMMUNITY)
Admission: EM | Admit: 2016-06-25 | Discharge: 2016-06-25 | Disposition: A | Discharge status: Home / Self Care | Payer: 59 | Attending: Emergency Medicine | Admitting: Emergency Medicine

## 2016-06-25 DIAGNOSIS — Z79899 Other long term (current) drug therapy: Secondary | ICD-10-CM | POA: Diagnosis not present

## 2016-06-25 DIAGNOSIS — R0789 Other chest pain: Secondary | ICD-10-CM | POA: Diagnosis not present

## 2016-06-25 MED ORDER — DIAZEPAM 5 MG PO TABS: 5.0000 mg | ORAL_TABLET | Freq: Two times a day (BID) | ORAL | 0 refills | Status: DC | PRN

## 2016-06-25 MED ORDER — DIAZEPAM 5 MG PO TABS
5.0000 mg | ORAL_TABLET | Freq: Once | ORAL | Status: AC
  Administered 2016-06-25: 5 mg via ORAL
  Filled 2016-06-25: qty 1

## 2016-06-25 NOTE — ED Triage Notes (Signed)
Pt got off work this am and was talking to an employee and suddenly had a wave of chest pain, numbness and tingling in hands and lightheaded, he also states that his mouth is really dry.

## 2016-06-25 NOTE — Discharge Instructions (Signed)
As discussed, today's evaluation has been largely reassuring. However, it is important that he follow up with outpatient providers to continue your evaluation of both the anxiety and the chest pain.  Return here for concerning changes in your condition.

## 2016-06-25 NOTE — ED Provider Notes (Signed)
[facility]Y DEPT Provider Note   CSN: [id number] Arrival date & time: 06/25/16  0630     History   Chief Complaint Chief Complaint  Patient presents with  . Chest Pain    HPI Larry Estes is a 25 y.o. male.  HPI  Young male presents with concern of episodic chest tightness, dyspnea, anxiousness. This episode began about 2 days ago, though he notes he has had multiple prior similar events going back for a long time. Patient notes a history of anxiety, for which she was previously taking diazepam, as well as history of migraines for which she takes Topamax. He has not used diazepam and about one month. Over the past few days he's had multiple episodes of chest tightness, discomfort, increased anxiety No clear precipitating, alleviating, exacerbating factors. Patient has been seen by urgent care once, recently, for similar episode. His primary care physician has retired, and he has not followed up with primary care again yet.   Past Medical History:  Diagnosis Date  . Allergic angiitis (HCC)   . Allergy   . Anxiety   . Headache 12/26/2015  . Multiple allergies   . Sickle cell trait [facility](HCC)     Patient Active Problem List   Diagnosis Date Noted  . Headache 12/26/2015  . Myalgia 12/11/2015    History reviewed. No pertinent surgical history.     Home Medications    Prior to Admission medications   Medication Sig Start Date End Date Taking? Authorizing Provider  busPIRone (BUSPAR) 15 MG tablet Take 0.5-1 tablets (7.5-15 mg total) by mouth 3 (three) times daily. Patient not taking: Reported on 06/20/2016 12/04/15   Ofilia NeasMichael L Clark, PA-C  diazepam (VALIUM) 5 MG tablet Take 1 tablet (5 mg total) by mouth every 12 (twelve) hours as needed for anxiety. 06/09/16   Alene MiresJennifer C Omohundro, NP  diclofenac (CATAFLAM) 50 MG tablet Take 1 tablet (50 mg total) by mouth 3 (three) times daily. One tablet TID with food prn pain. 06/20/16   Hayden Rasmussenavid Mabe, NP  escitalopram (LEXAPRO) 10 MG  tablet Take 1 tablet (10 mg total) by mouth daily. 04/18/16   Ofilia NeasMichael L Clark, PA-C  methocarbamol (ROBAXIN) 500 MG tablet Take 1 tablet (500 mg total) by mouth 2 (two) times daily. For muscle pain. May cause drowsiness. 06/20/16   Hayden Rasmussenavid Mabe, NP  naproxen (NAPROSYN) 500 MG tablet Take 1 tablet (500 mg total) by mouth 2 (two) times daily with a meal. Patient not taking: Reported on 06/20/2016 02/08/16   Alvira MondayErin Schlossman, MD  ondansetron (ZOFRAN ODT) 4 MG disintegrating tablet Take 1 tablet (4 mg total) by mouth every 8 (eight) hours as needed for nausea or vomiting. Patient not taking: Reported on 06/20/2016 12/11/15   Briscoe DeutscherBryan R Hess, DO  SUMAtriptan (IMITREX) 50 MG tablet Take 1 tablet (50 mg total) by mouth once. May repeat in 2 hours if headache persists or recurs. Patient not taking: Reported on 06/20/2016 12/04/15   Ofilia NeasMichael L Clark, PA-C  topiramate (TOPAMAX) 25 MG tablet Take one tablet at night for one week, then take 2 tablets at night for one week, then take 3 tablets at night. Patient taking differently: Take 75 mg by mouth at bedtime.  02/25/16   York Spanielharles K Willis, MD    Family History Family History  Problem Relation Age of Onset  . Hypertension Mother   . Hypertension Maternal Grandmother   . Hypertension Paternal Grandmother   . CAD Other   . Hypertension Other   . Diabetes  Other     Social History Social History  Substance Use Topics  . Smoking status: Never Smoker  . Smokeless tobacco: Never Used  . Alcohol use Yes     Allergies   Fish allergy and Tramadol   Review of Systems Review of Systems  Constitutional:       Per HPI, otherwise negative  HENT:       Per HPI, otherwise negative  Respiratory:       Per HPI, otherwise negative  Cardiovascular:       Per HPI, otherwise negative  Gastrointestinal: Negative for vomiting.  Endocrine:       Negative aside from HPI  Genitourinary:       Neg aside from HPI   Musculoskeletal:       Per HPI, otherwise negative  Skin:  Negative.   Neurological: Negative for syncope.  Psychiatric/Behavioral: Positive for decreased concentration. The patient is nervous/anxious.      Physical Exam Updated Vital Signs BP 142/86 (BP Location: Left Arm)   Pulse 119   Temp 98.2 F (36.8 C) (Oral)   Resp 20   SpO2 100%   Physical Exam  Constitutional: He is oriented to person, place, and time. He appears well-developed. No distress.  HENT:  Head: Normocephalic and atraumatic.  Eyes: Conjunctivae and EOM are normal.  Cardiovascular: Normal rate and regular rhythm.   Pulmonary/Chest: Effort normal. No stridor. No respiratory distress.  Abdominal: He exhibits no distension.  Musculoskeletal: He exhibits no edema.  Neurological: He is alert and oriented to person, place, and time.  Skin: Skin is warm and dry.  Psychiatric: His mood appears anxious. Thought content is not paranoid and not delusional. Cognition and memory are not impaired. He expresses no homicidal and no suicidal ideation. He expresses no suicidal plans and no homicidal plans.  Nursing note and vitals reviewed.    ED Treatments / Results  Labs (all labs ordered are listed, but only abnormal results are displayed) Labs Reviewed  BASIC METABOLIC PANEL  CBC  I-STAT TROPOININ, ED    EKG with sinus tachycardia, rate 104, nonspecific T-wave changes, RSR 1 in lead V1, repolarization changes, unremarkable  Cardiac monitor variable rate 90/110, sinus, abnormal  Pulse oximetry 100% room air normal Chart review notable for a visit within the past month for similar concerns, as well as 7 ED visits in 6 months.   Radiology I reviewed the x-ray, agree with the interpretation   Procedures Procedures (including critical care time)  Medications Ordered in ED Medications  diazepam (VALIUM) tablet 5 mg (not administered)     Initial Impression / Assessment and Plan / ED Course  I have reviewed the triage vital signs and the nursing notes.  Pertinent  labs & imaging results that were available during my care of the patient were reviewed by me and considered in my medical decision making (see chart for details).  Clinical Course   Young male, well appearing, in no distress, with unremarkable vital signs, EKG, chest x-ray presents with recurrent chest tightness, anxiousness. Patient has minimal risk profile for ACS, pain his description of long history of similar episode suggests non-life-threatening etiology. Patient was provided dose of his previously prescribed benzodiazepine, short prescription, encouraged to follow-up with primary care physician, psychiatry, to further delineate his anxiousness, discuss appropriate ongoing medical therapy.    Gerhard Munch, MD 06/25/16 [phone]

## 2016-07-29 ENCOUNTER — Encounter (HOSPITAL_COMMUNITY): Payer: Self-pay

## 2016-07-29 ENCOUNTER — Emergency Department (HOSPITAL_COMMUNITY)
Admission: EM | Admit: 2016-07-29 | Discharge: 2016-07-29 | Disposition: A | Discharge status: Home / Self Care | Payer: 59 | Attending: Emergency Medicine | Admitting: Emergency Medicine

## 2016-07-29 ENCOUNTER — Emergency Department (HOSPITAL_COMMUNITY): Payer: 59

## 2016-07-29 DIAGNOSIS — Z7982 Long term (current) use of aspirin: Secondary | ICD-10-CM | POA: Insufficient documentation

## 2016-07-29 DIAGNOSIS — R55 Syncope and collapse: Secondary | ICD-10-CM | POA: Insufficient documentation

## 2016-07-29 LAB — RAPID STREP SCREEN (MED CTR MEBANE ONLY): STREPTOCOCCUS, GROUP A SCREEN (DIRECT): NEGATIVE

## 2016-07-29 LAB — BASIC METABOLIC PANEL
ANION GAP: 7 (ref 5–15)
BUN: 8 mg/dL (ref 6–20)
CALCIUM: 9.5 mg/dL (ref 8.9–10.3)
CO2: 28 mmol/L (ref 22–32)
CREATININE: 0.89 mg/dL (ref 0.61–1.24)
Chloride: 107 mmol/L (ref 101–111)
GFR calc Af Amer: >60 mL/min (ref >60)
GLUCOSE: 99 mg/dL (ref 65–99)
Potassium: 4 mmol/L (ref 3.5–5.1)
Sodium: 142 mmol/L (ref 135–145)

## 2016-07-29 LAB — CBG MONITORING, ED: GLUCOSE-CAPILLARY: 83 mg/dL (ref 65–99)

## 2016-07-29 LAB — URINALYSIS, ROUTINE W REFLEX MICROSCOPIC
BILIRUBIN URINE: NEGATIVE
Glucose, UA: NEGATIVE mg/dL
HGB URINE DIPSTICK: NEGATIVE
KETONES UR: NEGATIVE mg/dL
Leukocytes, UA: NEGATIVE
NITRITE: NEGATIVE
PROTEIN: NEGATIVE mg/dL
Specific Gravity, Urine: 1.012 (ref 1.005–1.030)
pH: 7.5 (ref 5.0–8.0)

## 2016-07-29 LAB — CBC
HCT: 42.2 % (ref 39.0–52.0)
HEMOGLOBIN: 14.6 g/dL (ref 13.0–17.0)
MCH: 29.4 pg (ref 26.0–34.0)
MCHC: 34.6 g/dL (ref 30.0–36.0)
MCV: 84.9 fL (ref 78.0–100.0)
PLATELETS: 258 10*3/uL (ref 150–400)
RBC: 4.97 MIL/uL (ref 4.22–5.81)
RDW: 12.8 % (ref 11.5–15.5)
WBC: 4.9 10*3/uL (ref 4.0–10.5)

## 2016-07-29 LAB — I-STAT TROPONIN, ED: TROPONIN I, POC: 0 ng/mL (ref 0.00–0.08)

## 2016-07-29 MED ORDER — SODIUM CHLORIDE 0.9 % IV BOLUS (SEPSIS)
1000.0000 mL | Freq: Once | INTRAVENOUS | Status: AC
  Administered 2016-07-29: 1000 mL via INTRAVENOUS

## 2016-07-29 MED ORDER — IBUPROFEN 800 MG PO TABS
800.0000 mg | ORAL_TABLET | Freq: Once | ORAL | Status: AC
  Administered 2016-07-29: 800 mg via ORAL
  Filled 2016-07-29: qty 1

## 2016-07-29 MED ORDER — ACETAMINOPHEN 325 MG PO TABS
650.0000 mg | ORAL_TABLET | Freq: Once | ORAL | Status: AC
  Administered 2016-07-29: 650 mg via ORAL
  Filled 2016-07-29: qty 2

## 2016-07-29 MED ORDER — ACETAMINOPHEN 325 MG PO TABS: 650.0000 mg | ORAL_TABLET | Freq: Once | ORAL | Status: DC

## 2016-07-29 NOTE — ED Notes (Signed)
Pt able to ambulate with no problem. 

## 2016-07-29 NOTE — ED Provider Notes (Signed)
[facility] DEPT Provider Note   CSN: [id number] Arrival date & time: 07/29/16  1839     History   Chief Complaint Chief Complaint  Patient presents with  . Loss of Consciousness    HPI Larry Estes is a 25 y.o. male.  HPI Larry Estes is a 25 y.o. male with PMH significant for anxiety and migraines who presents with syncopal episode.  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  He called EMS, said his BP was slightly elevated, but had a normal EKG.  He went home and when walking in his driveway had a sudden onset, severe, unilateral headache he describes as a balloon popping in his head, and began hyperventilating again.  Immediately following this headache he passed out.  He does not know how long he was on the ground, but states that his neighbor saw fall and called EMS.  He is alert and oriented, but continues to complain of left sided headache along with left sided chest pain he describes as pressure, shortness of breath, and left sided neck stiffness.  He denies fever or cough, but states he has had a sore throat for the past week.  He denies any new medication changes.  Has had normal PO intake.  No family history of sudden cardiac death.  Echo 09/27/2015 normal.  No hx of DVT/PE, recent surgery, or immobilization.  Past Medical History:  Diagnosis Date  . Allergic angiitis (HCC)   . Allergy   . Anxiety   . Headache 12/26/2015  . Multiple allergies   . Sickle cell trait [facility])     Patient Active Problem List   Diagnosis Date Noted  . Headache 12/26/2015  . Myalgia 12/11/2015    History reviewed. No pertinent surgical history.     Home Medications    Prior to Admission medications   Medication Sig Start Date End Date Taking? Authorizing Provider  Aspirin-Salicylamide-Caffeine (BC HEADACHE PO) Take 1 packet by mouth  daily as needed (headache).   Yes Historical Provider, MD  Cyanocobalamin (VITAMIN B 12 PO) Take 2 tablets by mouth daily.   Yes Historical Provider, MD  Pyridoxine HCl (VITAMIN B6 PO) Take 2 tablets by mouth daily.   Yes Historical Provider, MD  VITAMIN A PO Take 2 tablets by mouth daily.   Yes Historical Provider, MD  VITAMIN E PO Take 1 tablet by mouth daily.   Yes Historical Provider, MD  busPIRone (BUSPAR) 15 MG tablet Take 0.5-1 tablets (7.5-15 mg total) by mouth 3 (three) times daily. Patient not taking: Reported on 07/29/2016 12/04/15   Ofilia Neas, PA-C  diazepam (VALIUM) 5 MG tablet Take 1 tablet (5 mg total) by mouth every 12 (twelve) hours as needed for anxiety. 06/25/16   Gerhard Munch, MD  diclofenac (CATAFLAM) 50 MG tablet Take 1 tablet (50 mg total) by mouth 3 (three) times daily. One tablet TID with food prn pain. Patient not taking: Reported on 07/29/2016 06/20/16   Hayden Rasmussen, NP  escitalopram (LEXAPRO) 10 MG tablet Take 1 tablet (10 mg total) by mouth daily. Patient not taking: Reported on 07/29/2016 04/18/16   Ofilia Neas, PA-C  methocarbamol (ROBAXIN) 500 MG tablet Take 1 tablet (500 mg total) by mouth 2 (two) times daily. For muscle pain. May cause drowsiness. Patient not taking: Reported on 07/29/2016 06/20/16   Hayden Rasmussen, NP  naproxen (  NAPROSYN) 500 MG tablet Take 1 tablet (500 mg total) by mouth 2 (two) times daily with a meal. Patient not taking: Reported on 07/29/2016 02/08/16   Alvira MondayErin Schlossman, MD  ondansetron (ZOFRAN ODT) 4 MG disintegrating tablet Take 1 tablet (4 mg total) by mouth every 8 (eight) hours as needed for nausea or vomiting. Patient not taking: Reported on 07/29/2016 12/11/15   Briscoe DeutscherBryan R Hess, DO  SUMAtriptan (IMITREX) 50 MG tablet Take 1 tablet (50 mg total) by mouth once. May repeat in 2 hours if headache persists or recurs. Patient not taking: Reported on 07/29/2016 12/04/15   Ofilia NeasMichael L Clark, PA-C  topiramate (TOPAMAX) 25 MG tablet Take one tablet at  night for one week, then take 2 tablets at night for one week, then take 3 tablets at night. Patient taking differently: Take 75 mg by mouth at bedtime.  02/25/16   York Spanielharles K Willis, MD    Family History Family History  Problem Relation Age of Onset  . Hypertension Mother   . Hypertension Maternal Grandmother   . Hypertension Paternal Grandmother   . CAD Other   . Hypertension Other   . Diabetes Other     Social History Social History  Substance Use Topics  . Smoking status: Never Smoker  . Smokeless tobacco: Never Used  . Alcohol use Yes     Allergies   Fish allergy and Tramadol   Review of Systems Review of Systems All other systems negative unless otherwise stated in HPI   Physical Exam Updated Vital Signs BP 118/56 (BP Location: Right Arm)   Pulse (!) 54   Temp 98.4 F (36.9 C) (Oral)   Resp 18   Ht 5\' 7"  (1.702 m)   Wt 81.6 kg   SpO2 98%   BMI 28.19 kg/m   Physical Exam  Constitutional: He is oriented to person, place, and time. He appears well-developed and well-nourished.  Non-toxic appearance. He does not have a sickly appearance. He does not appear ill.  HENT:  Head: Normocephalic and atraumatic.  Mouth/Throat: Oropharynx is clear and moist.  Eyes: Conjunctivae are normal. Pupils are equal, round, and reactive to light.  Neck: Normal range of motion. Neck supple.  No nuchal rigidity. No cervical midline tenderness.   Cardiovascular: Normal rate and regular rhythm.   No unilateral leg swelling.   Pulmonary/Chest: Effort normal and breath sounds normal. No accessory muscle usage or stridor. No respiratory distress. He has no wheezes. He has no rhonchi. He has no rales.  Abdominal: Soft. Bowel sounds are normal. He exhibits no distension. There is no tenderness.  Musculoskeletal: Normal range of motion.  Lymphadenopathy:    He has no cervical adenopathy.  Neurological: He is alert and oriented to person, place, and time.  Mental Status:   AOx3.   Speech clear without dysarthria. Cranial Nerves:  I-not tested  II-PERRLA  III, IV, VI-EOMs intact  V-temporal and masseter strength intact  VII-symmetrical facial movements intact, no facial droop  VIII-hearing grossly intact bilaterally  IX, X-gag intact  XI-strength of sternomastoid and trapezius muscles 5/5  XII-tongue midline Motor:   Good muscle bulk and tone  Strength 5/5 bilaterally in upper and lower extremities   Cerebellar--intact RAMs, finger to nose intact bilaterally.  Gait normal  No pronator drift Sensory:  Intact in upper and lower extremities   Skin: Skin is warm and dry.  Psychiatric: He has a normal mood and affect. His behavior is normal.     ED Treatments / Results  Labs (all labs ordered are listed, but only abnormal results are displayed) Labs Reviewed  RAPID STREP SCREEN (NOT AT [facility])  CULTURE, GROUP A STREP [facility])  BASIC METABOLIC PANEL  CBC  URINALYSIS, ROUTINE W REFLEX MICROSCOPIC (NOT AT [facility])  CBG MONITORING, ED  I-STAT TROPOININ, ED    EKG  EKG Interpretation None       Radiology Ct Head Wo Contrast  Result Date: 07/29/2016 CLINICAL DATA:  Syncopal episode severe headache and dizziness EXAM: CT HEAD WITHOUT CONTRAST TECHNIQUE: Contiguous axial images were obtained from the base of the skull through the vertex without intravenous contrast. COMPARISON:  09/24/2014, 03/12/2016 FINDINGS: Brain: No evidence of acute infarction, hemorrhage, hydrocephalus, extra-axial collection or mass lesion/mass effect. Vascular: No hyperdense vessel or unexpected calcification. Skull: Normal. Negative for fracture or focal lesion. Sinuses/Orbits: Mild mucosal thickening in the ethmoid sinuses. Orbits appear grossly intact. Other: None IMPRESSION: 1. No CT evidence for acute intracranial abnormality. 2. Paranasal sinus disease Electronically Signed   By: Jasmine Pang M.D.   On: 07/29/2016 20:27    Procedures Procedures (including critical care  time)  Medications Ordered in ED Medications  acetaminophen (TYLENOL) tablet 650 mg (650 mg Oral Refused 07/29/16 2315)  ibuprofen (ADVIL,MOTRIN) tablet 800 mg (800 mg Oral Given 07/29/16 2145)  acetaminophen (TYLENOL) tablet 650 mg (650 mg Oral Given 07/29/16 2145)  sodium chloride 0.9 % bolus 1,000 mL (0 mLs Intravenous Stopped 07/29/16 2305)     Initial Impression / Assessment and Plan / ED Course  I have reviewed the triage vital signs and the nursing notes.  Pertinent labs & imaging results that were available during my care of the patient were reviewed by me and considered in my medical decision making (see chart for details).  Clinical Course   Patient presents with syncopal episode approximately 4:30 PM preceded by severe headache after hyperventilating.  Hx of migraines, but this HA is different.  Also, complaining of left sided chest pressure, shortness of breath.  VSS, NAD.  Heart sounds normal, lungs CTAB, abdomen soft and benign.  Normal neurological exam.  Low risk ACS.  Low risk PE, Well's criteria, PERC negative. Differential includes SAH with this headache.  Will obtain head CT (within 6 hour window), labs, and EKG.  CT head normal.  EKG without arrhythmia or ischemia.  Troponin normal.  Labs without metabolic derangements. Stable hemoglobin.  Patient is low risk SF syncope rules.  Both times patient began hyperventilating and then synopsized.  States does feel similar to prior anxiety. Feels better.  Able to ambulate in ED without difficulty.  Plan to follow up with PCP.  Return precautions discussed.  Stable for discharge.   Final Clinical Impressions(s) / ED Diagnoses   Final diagnoses:  Syncope, unspecified syncope type    New Prescriptions Discharge Medication List as of 07/29/2016 10:59 PM       Cheri Fowler, PA-C 07/30/16 0038    Mancel Bale, MD 07/30/16 0111

## 2016-07-29 NOTE — ED Notes (Signed)
Pt in CT.

## 2016-07-29 NOTE — Progress Notes (Signed)
Patient listed as having [employer] insurance without a pcp.  EDCM spoke to patient at bedside.  [facility] within a 20 mile radius of patient's zip code [phone].  Patient thankful for resources.  No further EDCM needs at this time.

## 2016-07-29 NOTE — Discharge Instructions (Signed)
Your syncope was likely related to anxiety.  Your EKG, lab work, urinalysis, and head CT are normal today.  Please follow up with your primary care physician.  Return to the ED if you experience worsening chest pain, shortness of breath, headache, or any other symptoms.

## 2016-07-29 NOTE — ED Triage Notes (Signed)
PT C/O SYNCOPAL EPISODE X2 TODAY. PT STS HE WAS WALKING ON HIS JOB, WHEN IS PASSED OUT.PT STS PRIOR TO HIM GOING OUT, HE EXPERIENCED A SEVER HEADACHE AND DIZZINESS. PT STS THE EMS WAS CALLED, BUT TOLD HIM HE WAS FINE. PT STS AS SOON AS THEY LEFT, HE PASSED PUT AGAIN.

## 2016-08-01 LAB — CULTURE, GROUP A STREP (THRC)

## 2016-08-07 ENCOUNTER — Telehealth: Payer: Self-pay

## 2016-08-07 NOTE — Telephone Encounter (Signed)
PATIENT STATES HE WAS SEEN A WHILE AGO BY MICHAEL CLARK AND HE DID STD TESTING ON HIM. HE WANTS TO GET HIS RESULTS AS SOON AS POSSIBLE. HE ESPECIALLY WANTS THE HERPES RESULTS. BEST PHONE [phone] (CELL)  MBC

## 2016-08-09 NOTE — Telephone Encounter (Signed)
I donot see a std culture in chart? He was seen 07/2016 and had labs, urine and strep cx? Please advise

## 2016-08-10 NOTE — Telephone Encounter (Signed)
I have not seen him in about a year.

## 2016-08-12 ENCOUNTER — Emergency Department (HOSPITAL_COMMUNITY): Payer: 59

## 2016-08-12 ENCOUNTER — Emergency Department (HOSPITAL_COMMUNITY)
Admission: EM | Admit: 2016-08-12 | Discharge: 2016-08-12 | Disposition: A | Discharge status: Home / Self Care | Payer: 59 | Attending: Emergency Medicine | Admitting: Emergency Medicine

## 2016-08-12 ENCOUNTER — Encounter (HOSPITAL_COMMUNITY): Payer: Self-pay | Admitting: Emergency Medicine

## 2016-08-12 DIAGNOSIS — Y929 Unspecified place or not applicable: Secondary | ICD-10-CM | POA: Diagnosis not present

## 2016-08-12 DIAGNOSIS — W19XXXA Unspecified fall, initial encounter: Secondary | ICD-10-CM

## 2016-08-12 DIAGNOSIS — Y939 Activity, unspecified: Secondary | ICD-10-CM | POA: Insufficient documentation

## 2016-08-12 DIAGNOSIS — W1839XA Other fall on same level, initial encounter: Secondary | ICD-10-CM | POA: Insufficient documentation

## 2016-08-12 DIAGNOSIS — Z79899 Other long term (current) drug therapy: Secondary | ICD-10-CM | POA: Insufficient documentation

## 2016-08-12 DIAGNOSIS — R0789 Other chest pain: Secondary | ICD-10-CM | POA: Diagnosis not present

## 2016-08-12 DIAGNOSIS — Y999 Unspecified external cause status: Secondary | ICD-10-CM | POA: Insufficient documentation

## 2016-08-12 DIAGNOSIS — S299XXA Unspecified injury of thorax, initial encounter: Secondary | ICD-10-CM | POA: Diagnosis present

## 2016-08-12 MED ORDER — KETOROLAC TROMETHAMINE 30 MG/ML IJ SOLN
10.0000 mg | Freq: Once | INTRAMUSCULAR | Status: DC
  Filled 2016-08-12: qty 1

## 2016-08-12 MED ORDER — IBUPROFEN 400 MG PO TABS: 400.0000 mg | ORAL_TABLET | Freq: Three times a day (TID) | ORAL | 0 refills | Status: AC

## 2016-08-12 MED ORDER — ALBUTEROL SULFATE (2.5 MG/3ML) 0.083% IN NEBU
5.0000 mg | INHALATION_SOLUTION | Freq: Once | RESPIRATORY_TRACT | Status: AC
  Administered 2016-08-12: 5 mg via RESPIRATORY_TRACT
  Filled 2016-08-12: qty 6

## 2016-08-12 NOTE — ED Provider Notes (Signed)
[facility]-EMERGENCY DEPT Provider Note   CSN: [id number] Arrival date & time: 08/12/16  1059     History   Chief Complaint Chief Complaint  Patient presents with  . Fall  . Shortness of Breath    HPI Larry Estes is a 25 y.o. male.  HPI Patient presents with right-sided chest wall pain. Patient recalls falling, about 12 hours ago, and on top of him. Patient was well prior to the fall. Since the fall he has had pain persistently in the right anterior inferior axilla. Associated dyspnea, pain with inspiration. No relief with OTC medication. No other chest pain, no headache, no syncope.   Past Medical History:  Diagnosis Date  . Allergic angiitis (HCC)   . Allergy   . Anxiety   . Headache 12/26/2015  . Multiple allergies   . Sickle cell trait [facility])     Patient Active Problem List   Diagnosis Date Noted  . Headache 12/26/2015  . Myalgia 12/11/2015    History reviewed. No pertinent surgical history.     Home Medications    Prior to Admission medications   Medication Sig Start Date End Date Taking? Authorizing Provider  Cyanocobalamin (VITAMIN B 12 PO) Take 2 tablets by mouth daily.   Yes Historical Provider, MD  diazepam (VALIUM) 5 MG tablet Take 1 tablet (5 mg total) by mouth every 12 (twelve) hours as needed for anxiety. 06/25/16  Yes Gerhard Munch, MD  Pyridoxine HCl (VITAMIN B6 PO) Take 2 tablets by mouth daily.   Yes Historical Provider, MD  topiramate (TOPAMAX) 25 MG tablet Take one tablet at night for one week, then take 2 tablets at night for one week, then take 3 tablets at night. Patient taking differently: Take 75 mg by mouth at bedtime.  02/25/16  Yes York Spaniel, MD  VITAMIN A PO Take 2 tablets by mouth daily.   Yes Historical Provider, MD  VITAMIN E PO Take 1 tablet by mouth daily.   Yes Historical Provider, MD  busPIRone (BUSPAR) 15 MG tablet Take 0.5-1 tablets (7.5-15 mg total) by mouth 3 (three) times daily. Patient not taking: Reported on  07/29/2016 12/04/15   Ofilia Neas, PA-C  diclofenac (CATAFLAM) 50 MG tablet Take 1 tablet (50 mg total) by mouth 3 (three) times daily. One tablet TID with food prn pain. Patient not taking: Reported on 07/29/2016 06/20/16   Hayden Rasmussen, NP  escitalopram (LEXAPRO) 10 MG tablet Take 1 tablet (10 mg total) by mouth daily. Patient not taking: Reported on 07/29/2016 04/18/16   Ofilia Neas, PA-C  methocarbamol (ROBAXIN) 500 MG tablet Take 1 tablet (500 mg total) by mouth 2 (two) times daily. For muscle pain. May cause drowsiness. Patient not taking: Reported on 07/29/2016 06/20/16   Hayden Rasmussen, NP  naproxen (NAPROSYN) 500 MG tablet Take 1 tablet (500 mg total) by mouth 2 (two) times daily with a meal. Patient not taking: Reported on 07/29/2016 02/08/16   Alvira Monday, MD  ondansetron (ZOFRAN ODT) 4 MG disintegrating tablet Take 1 tablet (4 mg total) by mouth every 8 (eight) hours as needed for nausea or vomiting. Patient not taking: Reported on 07/29/2016 12/11/15   Briscoe Deutscher, DO  SUMAtriptan (IMITREX) 50 MG tablet Take 1 tablet (50 mg total) by mouth once. May repeat in 2 hours if headache persists or recurs. Patient not taking: Reported on 07/29/2016 12/04/15   Ofilia Neas, PA-C    Family History Family History  Problem Relation Age of Onset  .  Hypertension Mother   . Hypertension Maternal Grandmother   . Hypertension Paternal Grandmother   . CAD Other   . Hypertension Other   . Diabetes Other     Social History Social History  Substance Use Topics  . Smoking status: Never Smoker  . Smokeless tobacco: Never Used  . Alcohol use Yes     Allergies   Fish allergy and Tramadol   Review of Systems Review of Systems  Constitutional:       Per HPI, otherwise negative  HENT:       Per HPI, otherwise negative  Respiratory:       Per HPI, otherwise negative  Cardiovascular:       Per HPI, otherwise negative  Gastrointestinal: Negative for vomiting.  Endocrine:        Negative aside from HPI  Genitourinary:       Neg aside from HPI   Musculoskeletal:       Per HPI, otherwise negative  Skin: Negative.   Neurological: Negative for syncope.     Physical Exam Updated Vital Signs BP 138/75 (BP Location: Left Arm)   Pulse 61   Temp 97.9 F (36.6 C) (Oral)   Resp 21   SpO2 99%   Physical Exam  Constitutional: He is oriented to person, place, and time. He appears well-developed. No distress.  HENT:  Head: Normocephalic and atraumatic.  Eyes: Conjunctivae and EOM are normal.  Cardiovascular: Normal rate and regular rhythm.   Pulmonary/Chest: Effort normal. No stridor. No respiratory distress.  R lower anterior CW TTP, no deformity  Abdominal: He exhibits no distension.  Musculoskeletal: He exhibits no edema.  Neurological: He is alert and oriented to person, place, and time.  Skin: Skin is warm and dry.  Psychiatric: He has a normal mood and affect.  Nursing note and vitals reviewed.    ED Treatments / Results  Labs (all labs ordered are listed, but only abnormal results are displayed) Labs Reviewed - No data to display  EKG  EKG Interpretation  Date/Time:  Tuesday August 12 2016 11:29:03 EST Ventricular Rate:  58 PR Interval:    QRS Duration: 84 QT Interval:  380 QTC Calculation: 374 R Axis:   79 Text Interpretation:  Sinus rhythm Baseline wander in lead(s) V1 V3 Borderline ECG Confirmed by Gerhard MunchLOCKWOOD, Trany Chernick  MD [phone]([id number]) on 08/12/2016 12:22:26 PM       Radiology Dg Chest 2 View  Result Date: 08/12/2016 CLINICAL DATA:  Pain following fall EXAM: CHEST  2 VIEW COMPARISON:  June 25, 2016 FINDINGS: There is no edema or consolidation. The heart size and pulmonary vascularity normal. No adenopathy. No pneumothorax. No evident fracture. There is spina bifida occulta at T1. IMPRESSION: No edema or consolidation.  No evident pneumothorax. Electronically Signed   By: Bretta BangWilliam  Woodruff III M.D.   On: 08/12/2016 12:12   Ct Chest Wo  Contrast  Result Date: 08/12/2016 CLINICAL DATA:  Right lateral chest pain and shortness of breath after a fall. Initial encounter. EXAM: CT CHEST WITHOUT CONTRAST TECHNIQUE: Multidetector CT imaging of the chest was performed following the standard protocol without IV contrast. COMPARISON:  Chest radiographs earlier today FINDINGS: Cardiovascular: No significant vascular findings. Normal heart size. No pericardial effusion. Mediastinum/Nodes: No enlarged mediastinal or axillary lymph nodes. Small volume soft tissue in the anterior mediastinum consistent with residual thymus. Thyroid gland, trachea, and esophagus demonstrate no significant findings. Lungs/Pleura: No pleural effusion or pneumothorax. Minimal dependent atelectasis in both lower lobes. No mass. Upper Abdomen:  Unremarkable. Musculoskeletal: No rib fracture or other acute osseous abnormality identified. No chest wall mass. IMPRESSION: No acute abnormality identified in the chest. Electronically Signed   By: Sebastian AcheAllen  Grady M.D.   On: 08/12/2016 14:53    Procedures Procedures (including critical care time)  Medications Ordered in ED Medications  ketorolac (TORADOL) 30 MG/ML injection 9.9 mg (9.9 mg Intramuscular Refused 08/12/16 1303)  albuterol (PROVENTIL) (2.5 MG/3ML) 0.083% nebulizer solution 5 mg (5 mg Nebulization Given 08/12/16 1259)     Initial Impression / Assessment and Plan / ED Course  I have reviewed the triage vital signs and the nursing notes.  Pertinent labs & imaging results that were available during my care of the patient were reviewed by me and considered in my medical decision making (see chart for details).  Clinical Course     Patient in no distress. X-ray and CT did not demonstrate pneumothorax, rib fractures. Patient aware of likely musculoskeletal etiology, with contusion most likely.   Final Clinical Impressions(s) / ED Diagnoses  Patient presents after mechanical fall with pain in his right anterior  lateral axilla. Patient is awake and alert, no evidence for respiratory compromise, but given the patient's description of decreased respiratory capacity, ongoing pain, and the traumatic cause, CT scans performed in addition to x-ray. This did not demonstrate fracture, nor pneumothorax. Patient discharged in stable condition.    Gerhard Munchobert Zannie Locastro, MD 08/12/16 [phone]

## 2016-08-12 NOTE — ED Notes (Signed)
Patient transported to X-ray 

## 2016-08-12 NOTE — ED Triage Notes (Signed)
Pt reports fall , reports right side pain, shortness of breath, also reports spitting blood . sts girlfriend fell on top of him, then they both fell on a sharp  Table. Visible skin abrasion in ribcage area .

## 2016-08-12 NOTE — Discharge Instructions (Signed)
As discussed, your evaluation today has been largely reassuring.  But, it is important that you monitor your condition carefully, and do not hesitate to return to the ED if you develop new, or concerning changes in your condition. ? ?Otherwise, please follow-up with your physician for appropriate ongoing care. ? ?

## 2016-08-12 NOTE — ED Notes (Signed)
ED Provider at bedside. 

## 2016-08-18 NOTE — Telephone Encounter (Signed)
Patient advised, he will come in for an std check appointment. tx up front for an appt.

## 2016-08-20 ENCOUNTER — Ambulatory Visit: Payer: 59

## 2016-09-01 ENCOUNTER — Telehealth: Payer: Self-pay | Admitting: Neurology

## 2016-09-01 ENCOUNTER — Telehealth: Payer: Self-pay | Admitting: Adult Health

## 2016-09-01 NOTE — Telephone Encounter (Signed)
Patient reports worsening of HA's. Pt advises he is having constant facial twitching that started about 1 mth ago.  An appointment was made for 11/28.  Advised that the nurse would call if there was any other questions.

## 2016-09-01 NOTE — Telephone Encounter (Signed)
Spoke to patient, advised Aundra MilletMegan had an emergency and we have to reschedule his appointment in the morning. He actually needs to be seen this week for facial twitching and worsening headaches, very nicely reuested an urgent appointment this week. This is a patient of Dr. Anne HahnWillis.   Victorino DikeJennifer, can Dr Anne HahnWillis see him or can you get him scheduled this week with someone else? Please call him tomorrow.   thanks

## 2016-09-02 ENCOUNTER — Ambulatory Visit: Payer: Self-pay | Admitting: Adult Health

## 2016-09-02 ENCOUNTER — Ambulatory Visit: Payer: Self-pay | Admitting: Neurology

## 2016-09-02 ENCOUNTER — Telehealth: Payer: Self-pay

## 2016-09-02 NOTE — Telephone Encounter (Signed)
Pt no-showed today's appt. 

## 2016-09-02 NOTE — Telephone Encounter (Signed)
Could work pt in a 7:30 slot on Dr. Anne HahnWillis' schedule. Currently Thurs 11/30 and Mon 12/4 are available.

## 2016-09-02 NOTE — Telephone Encounter (Signed)
Called pt and let him know that Dr. Anne HahnWillis had a cancellation and could see him at 11 this morning. Says that he has class until 10 but should be able to make it. Placed on schedule today. Pt states that he will call back if "something comes up."

## 2016-09-03 NOTE — Telephone Encounter (Signed)
Patient has appointment 12/6.

## 2016-09-04 ENCOUNTER — Encounter: Payer: Self-pay | Admitting: Neurology

## 2016-09-10 ENCOUNTER — Ambulatory Visit: Payer: Self-pay | Admitting: Adult Health

## 2016-09-10 ENCOUNTER — Telehealth: Payer: Self-pay

## 2016-09-10 NOTE — Telephone Encounter (Signed)
This is the patient's third no-show- we can send a dismissal letter.

## 2016-09-10 NOTE — Telephone Encounter (Signed)
Patient did not show to today's appointment. This will be her 3rd no show this year. Would you like to proceed with dismissal procedure for No Show policy?

## 2016-09-11 ENCOUNTER — Encounter: Payer: Self-pay | Admitting: Neurology

## 2016-09-22 ENCOUNTER — Encounter (HOSPITAL_COMMUNITY): Payer: Self-pay | Admitting: Emergency Medicine

## 2016-09-22 ENCOUNTER — Emergency Department (HOSPITAL_COMMUNITY)
Admission: EM | Admit: 2016-09-22 | Discharge: 2016-09-23 | Disposition: A | Discharge status: Home / Self Care | Payer: 59 | Attending: Emergency Medicine | Admitting: Emergency Medicine

## 2016-09-22 DIAGNOSIS — F419 Anxiety disorder, unspecified: Secondary | ICD-10-CM

## 2016-09-22 DIAGNOSIS — R51 Headache: Secondary | ICD-10-CM | POA: Insufficient documentation

## 2016-09-22 DIAGNOSIS — Z7982 Long term (current) use of aspirin: Secondary | ICD-10-CM | POA: Insufficient documentation

## 2016-09-22 DIAGNOSIS — R519 Headache, unspecified: Secondary | ICD-10-CM

## 2016-09-22 NOTE — ED Triage Notes (Signed)
patient states that he has been having intermittent right sided headaches for month with muscle spasms on right side of face, patient has blurred vision with right eye. [facility], Goody powders and nothing takes pain away.

## 2016-09-22 NOTE — ED Notes (Signed)
Patient states he is having chest palpitations and request RN to place on cardiac monitor. Pt states that's what his pcp suggested if palpitations start again.

## 2016-09-23 MED ORDER — HYDROXYZINE HCL 25 MG PO TABS
25.0000 mg | ORAL_TABLET | Freq: Once | ORAL | Status: AC
  Administered 2016-09-23: 25 mg via ORAL
  Filled 2016-09-23: qty 1

## 2016-09-23 MED ORDER — METOCLOPRAMIDE HCL 5 MG/ML IJ SOLN
10.0000 mg | Freq: Once | INTRAMUSCULAR | Status: AC
  Administered 2016-09-23: 10 mg via INTRAVENOUS
  Filled 2016-09-23: qty 2

## 2016-09-23 MED ORDER — HYDROXYZINE HCL 25 MG PO TABS: 25.0000 mg | ORAL_TABLET | Freq: Three times a day (TID) | ORAL | 0 refills | Status: DC | PRN

## 2016-09-23 MED ORDER — KETOROLAC TROMETHAMINE 15 MG/ML IJ SOLN
15.0000 mg | Freq: Once | INTRAMUSCULAR | Status: AC
  Administered 2016-09-23: 15 mg via INTRAVENOUS
  Filled 2016-09-23: qty 1

## 2016-09-23 MED ORDER — DIPHENHYDRAMINE HCL 50 MG/ML IJ SOLN
25.0000 mg | Freq: Once | INTRAMUSCULAR | Status: AC
  Administered 2016-09-23: 25 mg via INTRAVENOUS
  Filled 2016-09-23: qty 1

## 2016-09-23 NOTE — ED Provider Notes (Signed)
[facility]-EMERGENCY DEPT Provider Note   CSN: [id number] Arrival date & time: 09/22/16  1833     History   Chief Complaint Chief Complaint  Patient presents with  . Headache  . Spasms    around right eye  . Nausea    HPI Larry Estes is a 25 y.o. male.  The history is provided by the patient. No language interpreter was used.  Headache     Larry Estes is a 25 y.o. male who presents to the Emergency Department complaining of headache.  He reports 3 months of right sided headaches. The headaches are located as right temporal and he feels like a boring and drilling-type sensation. The headaches wax and wane with no clear alleviating or worsening factors. He has intermittent twitching around his right eye. No vision changes. He does feel clumsy at times which is new for him. No fevers. He did have vomiting, none currently. Symptoms are moderate, waxing and waning, worsening.He reports being under increased stress at work recently. No head injury or trauma. He does have a history of migraine headaches. Past Medical History:  Diagnosis Date  . Allergic angiitis (HCC)   . Allergy   . Anxiety   . Headache 12/26/2015  . Multiple allergies   . Sickle cell trait [facility](HCC)     Patient Active Problem List   Diagnosis Date Noted  . Headache 12/26/2015  . Myalgia 12/11/2015    History reviewed. No pertinent surgical history.     Home Medications    Prior to Admission medications   Medication Sig Start Date End Date Taking? Authorizing Provider  Aspirin-Acetaminophen (GOODY BODY PAIN) 500-325 MG PACK Take 1 each by mouth every 8 (eight) hours as needed (pain).   Yes Historical Provider, MD  Cyanocobalamin (VITAMIN B 12 PO) Take 2 tablets by mouth daily.   Yes Historical Provider, MD  Pyridoxine HCl (VITAMIN B6 PO) Take 2 tablets by mouth daily.   Yes Historical Provider, MD  VITAMIN A PO Take 2 tablets by mouth daily.   Yes Historical Provider, MD  VITAMIN E PO Take 1 tablet by  mouth daily.   Yes Historical Provider, MD  diazepam (VALIUM) 5 MG tablet Take 1 tablet (5 mg total) by mouth every 12 (twelve) hours as needed for anxiety. Patient not taking: Reported on 09/22/2016 06/25/16   Gerhard Munchobert Lockwood, MD  hydrOXYzine (ATARAX/VISTARIL) 25 MG tablet Take 1 tablet (25 mg total) by mouth every 8 (eight) hours as needed for anxiety. 09/23/16   Tilden FossaElizabeth Fancy Dunkley, MD  ondansetron (ZOFRAN ODT) 4 MG disintegrating tablet Take 1 tablet (4 mg total) by mouth every 8 (eight) hours as needed for nausea or vomiting. Patient not taking: Reported on 09/22/2016 12/11/15   Briscoe DeutscherBryan R Hess, DO  topiramate (TOPAMAX) 25 MG tablet Take one tablet at night for one week, then take 2 tablets at night for one week, then take 3 tablets at night. Patient not taking: Reported on 09/22/2016 02/25/16   York Spanielharles K Willis, MD    Family History Family History  Problem Relation Age of Onset  . Hypertension Mother   . Hypertension Maternal Grandmother   . Hypertension Paternal Grandmother   . CAD Other   . Hypertension Other   . Diabetes Other     Social History Social History  Substance Use Topics  . Smoking status: Never Smoker  . Smokeless tobacco: Never Used  . Alcohol use Yes     Allergies   Fish allergy and Tramadol   Review  of Systems Review of Systems  Neurological: Positive for headaches.  All other systems reviewed and are negative.    Physical Exam Updated Vital Signs BP 125/88 (BP Location: Right Arm)   Pulse 69   Temp 98 F (36.7 C) (Oral)   Resp 14   Ht 5\' 7"  (1.702 m)   Wt 168 lb (76.2 kg)   SpO2 100%   BMI 26.31 kg/m   Physical Exam  Constitutional: He is oriented to person, place, and time. He appears well-developed and well-nourished.  HENT:  Head: Normocephalic and atraumatic.  Right Ear: External ear normal.  Left Ear: External ear normal.  Neck: Neck supple.  Cardiovascular: Normal rate and regular rhythm.   No murmur heard. Pulmonary/Chest: Effort  normal and breath sounds normal. No respiratory distress.  Abdominal: Soft. There is no tenderness. There is no rebound and no guarding.  Musculoskeletal: He exhibits no edema or tenderness.  Neurological: He is alert and oriented to person, place, and time. No cranial nerve deficit.  Skin: Skin is warm and dry.  Psychiatric: He has a normal mood and affect. His behavior is normal.  Nursing note and vitals reviewed.    ED Treatments / Results  Labs (all labs ordered are listed, but only abnormal results are displayed) Labs Reviewed - No data to display  EKG  EKG Interpretation None       Radiology No results found.  Procedures Procedures (including critical care time)  Medications Ordered in ED Medications  metoCLOPramide (REGLAN) injection 10 mg (10 mg Intravenous Given 09/23/16 0057)  ketorolac (TORADOL) 15 MG/ML injection 15 mg (15 mg Intravenous Given 09/23/16 0057)  diphenhydrAMINE (BENADRYL) injection 25 mg (25 mg Intravenous Given 09/23/16 0057)  hydrOXYzine (ATARAX/VISTARIL) tablet 25 mg (25 mg Oral Given 09/23/16 0046)     Initial Impression / Assessment and Plan / ED Course  I have reviewed the triage vital signs and the nursing notes.  Pertinent labs & imaging results that were available during my care of the patient were reviewed by me and considered in my medical decision making (see chart for details).  Clinical Course     Patient with history of migraines here for several months of worsening right-sided headache. He has had multiple prior evaluations for headache with CT head and MRI. No recent traumas and he has a normal neurologic examination. Clinical picture is not consistent with mass, subarachnoid hemorrhage, meningitis. Discussed with patient home care for headache as well as the importance of neurology follow-up for repeat assessment. Also discussed the patient's findings of anxiety and recommend outpatient follow-up.  Final Clinical  Impressions(s) / ED Diagnoses   Final diagnoses:  Bad headache  Anxiety    New Prescriptions New Prescriptions   HYDROXYZINE (ATARAX/VISTARIL) 25 MG TABLET    Take 1 tablet (25 mg total) by mouth every 8 (eight) hours as needed for anxiety.     Tilden FossaElizabeth Lavell Supple, MD 09/23/16 [phone]

## 2016-09-23 NOTE — ED Notes (Signed)
This rn walked into room, pt was standing on the bed, told to please get down. Patient shivering and "feeling funny" from Reglan. Provider informed. RN returned to room to find pt standing on bed again. Again patient told to get down, it is not safe to stand on the bed. Pt states he his feeling a little better and his headache has improved.

## 2016-09-24 ENCOUNTER — Ambulatory Visit: Payer: 59 | Admitting: Family

## 2016-09-26 ENCOUNTER — Emergency Department (HOSPITAL_COMMUNITY)
Admission: EM | Admit: 2016-09-26 | Discharge: 2016-09-26 | Disposition: A | Discharge status: Home / Self Care | Payer: Self-pay | Attending: Emergency Medicine | Admitting: Emergency Medicine

## 2016-09-26 ENCOUNTER — Emergency Department (HOSPITAL_COMMUNITY): Payer: Self-pay

## 2016-09-26 ENCOUNTER — Encounter (HOSPITAL_COMMUNITY): Payer: Self-pay | Admitting: Emergency Medicine

## 2016-09-26 DIAGNOSIS — G44219 Episodic tension-type headache, not intractable: Secondary | ICD-10-CM | POA: Insufficient documentation

## 2016-09-26 DIAGNOSIS — Z7982 Long term (current) use of aspirin: Secondary | ICD-10-CM | POA: Insufficient documentation

## 2016-09-26 DIAGNOSIS — R51 Headache: Secondary | ICD-10-CM

## 2016-09-26 DIAGNOSIS — R519 Headache, unspecified: Secondary | ICD-10-CM

## 2016-09-26 DIAGNOSIS — Z79899 Other long term (current) drug therapy: Secondary | ICD-10-CM | POA: Insufficient documentation

## 2016-09-26 LAB — I-STAT CHEM 8, ED
BUN: 10 mg/dL (ref 6–20)
CALCIUM ION: 1.19 mmol/L (ref 1.15–1.40)
CHLORIDE: 102 mmol/L (ref 101–111)
Creatinine, Ser: 1 mg/dL (ref 0.61–1.24)
GLUCOSE: 89 mg/dL (ref 65–99)
HCT: 41 % (ref 39.0–52.0)
Hemoglobin: 13.9 g/dL (ref 13.0–17.0)
POTASSIUM: 3.6 mmol/L (ref 3.5–5.1)
SODIUM: 139 mmol/L (ref 135–145)
TCO2: 25 mmol/L (ref 0–100)

## 2016-09-26 MED ORDER — NAPROXEN 500 MG PO TABS: 500.0000 mg | ORAL_TABLET | Freq: Two times a day (BID) | ORAL | 0 refills | Status: DC

## 2016-09-26 MED ORDER — NAPROXEN 500 MG PO TABS
500.0000 mg | ORAL_TABLET | Freq: Once | ORAL | Status: AC
  Administered 2016-09-26: 500 mg via ORAL
  Filled 2016-09-26: qty 1

## 2016-09-26 NOTE — Discharge Instructions (Signed)
Follow-up with a primary care doctor to follow up on your symptoms.  Call Dr. Clarisa KindredWillis's office who you have seen before for your headaches.

## 2016-09-26 NOTE — ED Triage Notes (Signed)
Per EMS. Pt reports HA this am. Smoked marijuana prior to onset of symptoms, but told EMS marijuana seemed the same as usual.  Also reports R eye twitching for a month. Pt also complained of CP and SOB. EKG normal with EMS. Pt speaking in complete sentences with no apparent difficulty.

## 2016-09-26 NOTE — ED Provider Notes (Signed)
[facility]-EMERGENCY DEPT Provider Note   CSN: [id number] Arrival date & time: 09/26/16  [id number]     History   Chief Complaint Chief Complaint  Patient presents with  . Chest Pain  . eye twitching    HPI Larry Estes is a 25 y.o. male.  HPI Patient complains of 2 main issues today. Patient presented to the emergency room with complaints of intermittent sharp pains on the right side of his head and temple. He has some associated eye twitching that has been going on for the last month. Patient does have a history of prior headaches.  He is not currently seeing anyone for that. He is concerned that he might have some type of serious neurologic condition and that's why he came into the emergency room.  Patient also mentioned having some chest discomfort and shortness of breath this morning associated with a headache. Denies any fevers or chills. No history of heart disease or blood clots. Past Medical History:  Diagnosis Date  . Allergic angiitis (HCC)   . Allergy   . Anxiety   . Headache 12/26/2015  . Multiple allergies   . Sickle cell trait [facility](HCC)     Patient Active Problem List   Diagnosis Date Noted  . Headache 12/26/2015  . Myalgia 12/11/2015    History reviewed. No pertinent surgical history.     Home Medications    Prior to Admission medications   Medication Sig Start Date End Date Taking? Authorizing Provider  Aspirin-Acetaminophen (GOODY BODY PAIN) 500-325 MG PACK Take 1 each by mouth every 8 (eight) hours as needed (pain).   Yes Historical Provider, MD  Cyanocobalamin (VITAMIN B 12 PO) Take 2 tablets by mouth daily.   Yes Historical Provider, MD  Pyridoxine HCl (VITAMIN B6 PO) Take 2 tablets by mouth daily.   Yes Historical Provider, MD  VITAMIN A PO Take 2 tablets by mouth daily.   Yes Historical Provider, MD  VITAMIN E PO Take 1 tablet by mouth daily.   Yes Historical Provider, MD  diazepam (VALIUM) 5 MG tablet Take 1 tablet (5 mg total) by mouth every 12 (twelve)  hours as needed for anxiety. Patient not taking: Reported on 09/22/2016 06/25/16   Gerhard Munchobert Lockwood, MD  hydrOXYzine (ATARAX/VISTARIL) 25 MG tablet Take 1 tablet (25 mg total) by mouth every 8 (eight) hours as needed for anxiety. Patient not taking: Reported on 09/26/2016 09/23/16   Tilden FossaElizabeth Rees, MD  naproxen (NAPROSYN) 500 MG tablet Take 1 tablet (500 mg total) by mouth 2 (two) times daily. 09/26/16   Linwood DibblesJon Deairra Halleck, MD  topiramate (TOPAMAX) 25 MG tablet Take one tablet at night for one week, then take 2 tablets at night for one week, then take 3 tablets at night. Patient not taking: Reported on 09/26/2016 02/25/16   York Spanielharles K Willis, MD    Family History Family History  Problem Relation Age of Onset  . Hypertension Mother   . Hypertension Maternal Grandmother   . Hypertension Paternal Grandmother   . CAD Other   . Hypertension Other   . Diabetes Other     Social History Social History  Substance Use Topics  . Smoking status: Never Smoker  . Smokeless tobacco: Never Used  . Alcohol use Yes     Allergies   Fish allergy and Tramadol   Review of Systems Review of Systems  All other systems reviewed and are negative.    Physical Exam Updated Vital Signs BP 111/66   Pulse (!) 55   Temp  98.3 F (36.8 C) (Oral)   Resp 13   SpO2 97%   Physical Exam  Constitutional: He appears well-developed and well-nourished. No distress.  HENT:  Head: Normocephalic and atraumatic.  Right Ear: External ear normal.  Left Ear: External ear normal.  Eyes: Conjunctivae are normal. Right eye exhibits no discharge. Left eye exhibits no discharge. No scleral icterus.  Neck: Neck supple. No tracheal deviation present.  Cardiovascular: Normal rate, regular rhythm and intact distal pulses.   Pulmonary/Chest: Effort normal and breath sounds normal. No stridor. No respiratory distress. He has no wheezes. He has no rales.  Abdominal: Soft. Bowel sounds are normal. He exhibits no distension. There is  no tenderness. There is no rebound and no guarding.  Musculoskeletal: He exhibits no edema or tenderness.  Neurological: He is alert. He has normal strength. No cranial nerve deficit (no facial droop, extraocular movements intact, no slurred speech) or sensory deficit. He exhibits normal muscle tone. He displays no seizure activity. Coordination normal.  Skin: Skin is warm and dry. No rash noted.  Psychiatric: He has a normal mood and affect.  Nursing note and vitals reviewed.    ED Treatments / Results  Labs (all labs ordered are listed, but only abnormal results are displayed) Labs Reviewed  I-STAT CHEM 8, ED    EKG  EKG Interpretation  Date/Time:  Friday September 26 2016 09:23:47 EST Ventricular Rate:  63 PR Interval:    QRS Duration: 88 QT Interval:  379 QTC Calculation: 388 R Axis:   73 Text Interpretation:  Sinus rhythm No significant change since last tracing Confirmed by Audre Cenci  MD-J, Roshanda Balazs ([id number]) on 09/26/2016 10:56:39 AM       Radiology Dg Chest 2 View  Result Date: 09/26/2016 CLINICAL DATA:  Left-sided chest pain worsening over the last week EXAM: CHEST  2 VIEW COMPARISON:  CT chest 08/12/2016 FINDINGS: The heart size and mediastinal contours are within normal limits. Both lungs are clear. The visualized skeletal structures are unremarkable. IMPRESSION: No active cardiopulmonary disease. Electronically Signed   By: Elige KoHetal  Patel   On: 09/26/2016 11:33    Procedures Procedures (including critical care time)  Medications Ordered in ED Medications  naproxen (NAPROSYN) tablet 500 mg (500 mg Oral Given 09/26/16 1130)     Initial Impression / Assessment and Plan / ED Course  I have reviewed the triage vital signs and the nursing notes.  Pertinent labs & imaging results that were available during my care of the patient were reviewed by me and considered in my medical decision making (see chart for details).  Clinical Course    I reviewed the patient's electronic  medical record. He was seen by Dr. Anne HahnWillis, neurology earlier this year for recurrent headache issues. Patient's had an MRI of the brain as well as a CT scan of the head this year. Both of those were unremarkable.  Patient's symptoms are not suggestive of acute infection or meningitis. I think the patient's having recurrent episode of his chronic headaches. Recommend he follow up with his neurologist to discuss further treatment.  Regarding the patient's chest pain and shortness of breath. The symptoms are atypical. EKG and chest x-ray are reassuring.  I think there could be an anxiety component to the patient's symptoms.  At this time there does not appear to be any evidence of an acute emergency medical condition and the patient appears stable for discharge with appropriate outpatient follow up.   Final Clinical Impressions(s) / ED Diagnoses   Final  diagnoses:  Nonintractable episodic headache, unspecified headache type    New Prescriptions New Prescriptions   NAPROXEN (NAPROSYN) 500 MG TABLET    Take 1 tablet (500 mg total) by mouth 2 (two) times daily.     Linwood Dibbles, MD 09/26/16 [phone]

## 2016-10-14 ENCOUNTER — Emergency Department (HOSPITAL_BASED_OUTPATIENT_CLINIC_OR_DEPARTMENT_OTHER): Payer: 59

## 2016-10-14 ENCOUNTER — Encounter (HOSPITAL_BASED_OUTPATIENT_CLINIC_OR_DEPARTMENT_OTHER): Payer: Self-pay | Admitting: Emergency Medicine

## 2016-10-14 ENCOUNTER — Emergency Department (HOSPITAL_BASED_OUTPATIENT_CLINIC_OR_DEPARTMENT_OTHER)
Admission: EM | Admit: 2016-10-14 | Discharge: 2016-10-14 | Disposition: A | Discharge status: Home / Self Care | Payer: 59 | Attending: Emergency Medicine | Admitting: Emergency Medicine

## 2016-10-14 DIAGNOSIS — R197 Diarrhea, unspecified: Secondary | ICD-10-CM | POA: Insufficient documentation

## 2016-10-14 DIAGNOSIS — R112 Nausea with vomiting, unspecified: Secondary | ICD-10-CM

## 2016-10-14 DIAGNOSIS — R202 Paresthesia of skin: Secondary | ICD-10-CM | POA: Insufficient documentation

## 2016-10-14 DIAGNOSIS — R519 Headache, unspecified: Secondary | ICD-10-CM

## 2016-10-14 DIAGNOSIS — J069 Acute upper respiratory infection, unspecified: Secondary | ICD-10-CM | POA: Insufficient documentation

## 2016-10-14 DIAGNOSIS — R51 Headache: Secondary | ICD-10-CM

## 2016-10-14 LAB — CBC WITH DIFFERENTIAL/PLATELET
Basophils Absolute: 0 10*3/uL (ref 0.0–0.1)
Basophils Relative: 1 %
EOS ABS: 0.1 10*3/uL (ref 0.0–0.7)
EOS PCT: 4 %
HCT: 37.1 % — ABNORMAL LOW (ref 39.0–52.0)
HEMOGLOBIN: 12.7 g/dL — AB (ref 13.0–17.0)
LYMPHS ABS: 1.5 10*3/uL (ref 0.7–4.0)
Lymphocytes Relative: 38 %
MCH: 28.2 pg (ref 26.0–34.0)
MCHC: 34.2 g/dL (ref 30.0–36.0)
MCV: 82.4 fL (ref 78.0–100.0)
MONOS PCT: 13 %
Monocytes Absolute: 0.5 10*3/uL (ref 0.1–1.0)
Neutro Abs: 1.8 10*3/uL (ref 1.7–7.7)
Neutrophils Relative %: 44 %
PLATELETS: 246 10*3/uL (ref 150–400)
RBC: 4.5 MIL/uL (ref 4.22–5.81)
RDW: 12.6 % (ref 11.5–15.5)
WBC: 3.9 10*3/uL — ABNORMAL LOW (ref 4.0–10.5)

## 2016-10-14 LAB — COMPREHENSIVE METABOLIC PANEL
ALK PHOS: 35 U/L — AB (ref 38–126)
ALT: 32 U/L (ref 17–63)
AST: 46 U/L — AB (ref 15–41)
Albumin: 3.8 g/dL (ref 3.5–5.0)
Anion gap: 5 (ref 5–15)
BILIRUBIN TOTAL: 0.7 mg/dL (ref 0.3–1.2)
BUN: 10 mg/dL (ref 6–20)
CALCIUM: 9.1 mg/dL (ref 8.9–10.3)
CO2: 28 mmol/L (ref 22–32)
CREATININE: 0.82 mg/dL (ref 0.61–1.24)
Chloride: 105 mmol/L (ref 101–111)
GFR calc Af Amer: >60 mL/min (ref >60)
Glucose, Bld: 101 mg/dL — ABNORMAL HIGH (ref 65–99)
Potassium: 3.9 mmol/L (ref 3.5–5.1)
Sodium: 138 mmol/L (ref 135–145)
TOTAL PROTEIN: 6.6 g/dL (ref 6.5–8.1)

## 2016-10-14 LAB — URINALYSIS, ROUTINE W REFLEX MICROSCOPIC
BILIRUBIN URINE: NEGATIVE
Glucose, UA: NEGATIVE mg/dL
HGB URINE DIPSTICK: NEGATIVE
Ketones, ur: NEGATIVE mg/dL
Leukocytes, UA: NEGATIVE
Nitrite: NEGATIVE
PH: 7 (ref 5.0–8.0)
Protein, ur: NEGATIVE mg/dL
SPECIFIC GRAVITY, URINE: 1.006 (ref 1.005–1.030)

## 2016-10-14 LAB — TROPONIN I: Troponin I: <0.03 ng/mL (ref <0.03)

## 2016-10-14 LAB — LIPASE, BLOOD: LIPASE: 23 U/L (ref 11–51)

## 2016-10-14 LAB — D-DIMER, QUANTITATIVE: D-Dimer, Quant: <0.27 ug/mL-FEU (ref 0.00–0.50)

## 2016-10-14 MED ORDER — ONDANSETRON HCL 4 MG/2ML IJ SOLN
4.0000 mg | Freq: Once | INTRAMUSCULAR | Status: AC
  Administered 2016-10-14: 4 mg via INTRAVENOUS
  Filled 2016-10-14: qty 2

## 2016-10-14 MED ORDER — SODIUM CHLORIDE 0.9 % IV BOLUS (SEPSIS)
500.0000 mL | Freq: Once | INTRAVENOUS | Status: AC
  Administered 2016-10-14: 500 mL via INTRAVENOUS

## 2016-10-14 MED ORDER — ONDANSETRON 4 MG PO TBDP: 4.0000 mg | ORAL_TABLET | Freq: Three times a day (TID) | ORAL | 0 refills | Status: DC | PRN

## 2016-10-14 MED ORDER — NAPROXEN 250 MG PO TABS: 250.0000 mg | ORAL_TABLET | Freq: Two times a day (BID) | ORAL | 0 refills | Status: DC

## 2016-10-14 MED ORDER — ACETAMINOPHEN 325 MG PO TABS
650.0000 mg | ORAL_TABLET | Freq: Once | ORAL | Status: AC
Start: 2016-10-14 — End: 2016-10-14
  Administered 2016-10-14: 650 mg via ORAL
  Filled 2016-10-14: qty 2

## 2016-10-14 MED ORDER — CETIRIZINE HCL 10 MG PO TABS: 10.0000 mg | ORAL_TABLET | Freq: Every day | ORAL | 1 refills | Status: DC

## 2016-10-14 MED ORDER — ACIDOPHILUS PROBIOTIC 10 MG PO TABS: 10.0000 mg | ORAL_TABLET | Freq: Three times a day (TID) | ORAL | 0 refills | Status: DC

## 2016-10-14 NOTE — ED Notes (Signed)
Tolerating PO fluids °

## 2016-10-14 NOTE — ED Notes (Signed)
Pt up and walking around room, calling girlfriend for a ride home

## 2016-10-14 NOTE — ED Provider Notes (Signed)
[facility] Provider [facility]   CSN: [id number] Arrival date & time: 10/14/16  1740   By signing my name below, I, Larry Estes, attest that this documentation has been prepared under the direction and in the presence of [name], PA-C. Electronically Signed: Teofilo PodMatthew P. Estes, ED Scribe. 10/14/2016. 6:41 PM.   History   Chief Complaint Chief Complaint  Patient presents with  . Emesis   The history is provided by the patient. No language interpreter was used.   HPI Comments:  Larry Estes is a 26 y.o. male who presents to the Emergency Department with multiple complaints x 2 weeks. Pt complains of associated headache, generalized abdominal pain, vomiting, diarrhea, cough, SOB, chest pain, congestion, sneezing, rhinorrhea, postnasal drip, fatigue, generalized body aches, chills. Pt also states that he began having left sided extremity numbness and tingling and right foot pain 9 hours ago, but states that it has resolved now.  Pt states that he has been seeing blue spots, but denies other changes to his vision.  Pt states that his headache is primarily is the front radiating to the left side of his head. Pt reports intermittent mid-left chest pain for 2 weeks. Pt reports 2 episodes of vomiting today with no blood, and 3 episodes of diarrhea today. No alleviating factors noted. He has taken nothing for treatment of his symptoms today. Pt denies urinary symptoms, fevers, sore throat, weakness, double vision, neck pain, neck stiffness, ear pain, rashes, eye pain, hematemesis, hematochezia, or sudden headache.   Past Medical History:  Diagnosis Date  . Allergic angiitis (HCC)   . Allergy   . Anxiety   . Headache 12/26/2015  . Multiple allergies   . Sickle cell trait [facility](HCC)     Patient Active Problem List   Diagnosis Date Noted  . Headache 12/26/2015  . Myalgia 12/11/2015    History reviewed. No pertinent surgical history.     Home Medications    Prior to  Admission medications   Medication Sig Start Date End Date Taking? Authorizing Provider  Aspirin-Acetaminophen (GOODY BODY PAIN) 500-325 MG PACK Take 1 each by mouth every 8 (eight) hours as needed (pain).    Historical Provider, MD  cetirizine (ZYRTEC ALLERGY) 10 MG tablet Take 1 tablet (10 mg total) by mouth daily. 10/14/16   Everlene FarrierWilliam Lolita Faulds, PA-C  Cyanocobalamin (VITAMIN B 12 PO) Take 2 tablets by mouth daily.    Historical Provider, MD  diazepam (VALIUM) 5 MG tablet Take 1 tablet (5 mg total) by mouth every 12 (twelve) hours as needed for anxiety. Patient not taking: Reported on 09/22/2016 06/25/16   Gerhard Munchobert Lockwood, MD  hydrOXYzine (ATARAX/VISTARIL) 25 MG tablet Take 1 tablet (25 mg total) by mouth every 8 (eight) hours as needed for anxiety. Patient not taking: Reported on 09/26/2016 09/23/16   Tilden FossaElizabeth Rees, MD  Lactobacillus (ACIDOPHILUS PROBIOTIC) 10 MG TABS Take 10 mg by mouth 3 (three) times daily. 10/14/16   Everlene FarrierWilliam Maragret Vanacker, PA-C  naproxen (NAPROSYN) 250 MG tablet Take 1 tablet (250 mg total) by mouth 2 (two) times daily with a meal. 10/14/16   Everlene FarrierWilliam Tynesia Harral, PA-C  ondansetron (ZOFRAN ODT) 4 MG disintegrating tablet Take 1 tablet (4 mg total) by mouth every 8 (eight) hours as needed for nausea or vomiting. 10/14/16   Everlene FarrierWilliam Vivika Poythress, PA-C  Pyridoxine HCl (VITAMIN B6 PO) Take 2 tablets by mouth daily.    Historical Provider, MD  topiramate (TOPAMAX) 25 MG tablet Take one tablet at night for one week, then take  2 tablets at night for one week, then take 3 tablets at night. Patient not taking: Reported on 09/26/2016 02/25/16   York Spaniel, MD  VITAMIN A PO Take 2 tablets by mouth daily.    Historical Provider, MD  VITAMIN E PO Take 1 tablet by mouth daily.    Historical Provider, MD    Family History Family History  Problem Relation Age of Onset  . Hypertension Mother   . Hypertension Maternal Grandmother   . Hypertension Paternal Grandmother   . CAD Other   . Hypertension Other   .  Diabetes Other     Social History Social History  Substance Use Topics  . Smoking status: Never Smoker  . Smokeless tobacco: Never Used  . Alcohol use Yes     Comment: socially     Allergies   Fish allergy and Tramadol   Review of Systems Review of Systems  Constitutional: Positive for chills and fatigue.  HENT: Positive for congestion, postnasal drip, rhinorrhea and sneezing. Negative for ear pain, facial swelling, nosebleeds, sore throat and trouble swallowing.   Eyes: Negative for photophobia and pain.  Respiratory: Positive for cough and shortness of breath. Negative for wheezing.   Cardiovascular: Positive for chest pain. Negative for palpitations and leg swelling.  Gastrointestinal: Positive for abdominal pain, diarrhea, nausea and vomiting. Negative for blood in stool.  Genitourinary: Negative for dysuria, flank pain, frequency, hematuria and urgency.  Musculoskeletal: Negative for back pain and neck stiffness.  Skin: Negative for rash and wound.  Neurological: Positive for numbness and headaches. Negative for dizziness, syncope, weakness and light-headedness.  All other systems reviewed and are negative.    Physical Exam Updated Vital Signs BP 120/71 (BP Location: Left Arm)   Pulse 68   Temp 97.9 F (36.6 C) (Oral)   Resp 18   Ht 5\' 7"  (1.702 m)   Wt 75.8 kg   SpO2 99%   BMI 26.16 kg/m   Physical Exam  Constitutional: He is oriented to person, place, and time. He appears well-developed and well-nourished. No distress.  Nontoxic appearing.  HENT:  Head: Normocephalic and atraumatic.  Right Ear: External ear normal.  Left Ear: External ear normal.  Mouth/Throat: Oropharynx is clear and moist. No oropharyngeal exudate.  Mild middle ear effusion noted bilaterally. No TM erythema or loss of landmarks. Boggy nasal turbinates bilaterally. Throat is clear. No tonsillar hypertrophy or exudates.  Eyes: Conjunctivae and EOM are normal. Pupils are equal, round, and  reactive to light. Right eye exhibits no discharge. Left eye exhibits no discharge.  Neck: Normal range of motion. Neck supple. No JVD present. No tracheal deviation present.  Cardiovascular: Normal rate, regular rhythm, normal heart sounds and intact distal pulses.  Exam reveals no gallop and no friction rub.   No murmur heard. Bilateral radial, posterior tibialis and dorsalis pedis pulses are intact.    Pulmonary/Chest: Effort normal and breath sounds normal. No stridor. No respiratory distress. He has no wheezes. He has no rales. He exhibits no tenderness.  Lungs clear to auscultation bilaterally. No increased work of breathing. No rales or rhonchi.  Abdominal: Soft. Bowel sounds are normal. He exhibits no distension and no mass. There is no tenderness. There is no rebound and no guarding.  Abdomen is soft and nontender to palpation. No peritoneal signs. No CVA or flank tenderness.  Musculoskeletal: Normal range of motion. He exhibits no edema, tenderness or deformity.  No lower extremity edema or tenderness.   Lymphadenopathy:  He has no cervical adenopathy.  Neurological: He is alert and oriented to person, place, and time. No cranial nerve deficit or sensory deficit. He exhibits normal muscle tone. Coordination normal.  Patient is alert and oriented 3. Cranial nerves are intact. Speech is clear and coherent. No pronator drift. Finger to nose intact bilaterally. Normal gait. Sensation is intact in his bilateral upper and lower extremities. EOMs are intact. Vision is grossly intact.  Skin: Skin is warm and dry. Capillary refill takes less than 2 seconds. No rash noted. He is not diaphoretic. No erythema. No pallor.  Psychiatric: He has a normal mood and affect. His behavior is normal.  Nursing note and vitals reviewed.    ED Treatments / Results  DIAGNOSTIC STUDIES:  Oxygen Saturation is 99% on RA, normal by my interpretation.    COORDINATION OF CARE:  6:39 PM Discussed treatment  plan with pt at bedside and pt agreed to plan.   Labs (all labs ordered are listed, but only abnormal results are displayed) Labs Reviewed  COMPREHENSIVE METABOLIC PANEL - Abnormal; Notable for the following:       Result Value   Glucose, Bld 101 (*)    AST 46 (*)    Alkaline Phosphatase 35 (*)    All other components within normal limits  CBC WITH DIFFERENTIAL/PLATELET - Abnormal; Notable for the following:    WBC 3.9 (*)    Hemoglobin 12.7 (*)    HCT 37.1 (*)    All other components within normal limits  LIPASE, BLOOD  TROPONIN I  D-DIMER, QUANTITATIVE (NOT AT [facility])  URINALYSIS, ROUTINE W REFLEX MICROSCOPIC    EKG  EKG Interpretation  Date/Time:  Tuesday October 14 2016 19:14:40 EST Ventricular Rate:  50 PR Interval:    QRS Duration: 95 QT Interval:  416 QTC Calculation: 380 R Axis:   73 Text Interpretation:  Sinus rhythm ST elev, probable normal early repol pattern Baseline wander in lead(s) I III aVL No significant change since last tracing Confirmed by FLOYD MD, DANIEL [phone]) on 10/14/2016 7:18:27 PM       Radiology Dg Chest 2 View  Result Date: 10/14/2016 CLINICAL DATA:  Chest pain EXAM: CHEST  2 VIEW COMPARISON:  09/26/2016 FINDINGS: The heart size and mediastinal contours are within normal limits. Both lungs are clear. The visualized skeletal structures are unremarkable. IMPRESSION: No active cardiopulmonary disease. Electronically Signed   By: Jasmine Pang M.D.   On: 10/14/2016 19:16   Ct Head Wo Contrast  Result Date: 10/14/2016 CLINICAL DATA:  Multiple complaints, mostly head pain with numbness and tingling in the left arm EXAM: CT HEAD WITHOUT CONTRAST TECHNIQUE: Contiguous axial images were obtained from the base of the skull through the vertex without intravenous contrast. COMPARISON:  07/29/2016,, MRI 03/12/2016 FINDINGS: Brain: No evidence of acute infarction, hemorrhage, hydrocephalus, extra-axial collection or mass lesion/mass effect. Vascular: No  hyperdense vessel or unexpected calcification. Skull: Normal. Negative for fracture or focal lesion. Sinuses/Orbits: Mucosal thickening in the ethmoid sinuses. No acute orbital abnormality. Other: None IMPRESSION: No CT evidence for acute intracranial abnormality. Electronically Signed   By: Jasmine Pang M.D.   On: 10/14/2016 19:16    Procedures Procedures (including critical care time)  Medications Ordered in ED Medications  ondansetron (ZOFRAN) injection 4 mg (4 mg Intravenous Given 10/14/16 1905)  sodium chloride 0.9 % bolus 500 mL (0 mLs Intravenous Stopped 10/14/16 2021)  acetaminophen (TYLENOL) tablet 650 mg (650 mg Oral Given 10/14/16 2005)     Initial Impression /  Assessment and Plan / ED Course  I have reviewed the triage vital signs and the nursing notes.  Pertinent labs & imaging results that were available during my care of the patient were reviewed by me and considered in my medical decision making (see chart for details).  Clinical Course    This is a 26 y.o. male who presents to the Emergency Department with multiple complaints x 2 weeks. Pt complains of associated headache, abdominal pain, vomiting, diarrhea, cough, SOB, chest pain, congestion, sneezing, rhinorrhea, postnasal drip, fatigue, generalized body aches, chills. Pt also states that he began having left sided extremity numbness and tingling and right foot pain 9 hours ago, but states that it has resolved now.  Pt states that he has been seeing blue spots, but denies other changes to his vision.  Pt states that his headache is primarily is the front radiating to the left side of his head. Pt reports intermittent mid-left chest pain for 2 weeks. Pt reports 2 episodes of vomiting today with no blood, and 3 episodes of diarrhea today. On exam the patient is afebrile and nontoxic appearing. He has no focal neurological deficits. He is able to ambulate with normal gait. Lungs are clear to auscultation bilaterally. Evidence of  upper respiratory infection on exam. EKG shows significant change from his last tracing. Lipase is within normal limits. CMP is remarkable only for mildly elevated AST at 46. Troponin is not elevated. D-dimer is not elevated. CBC shows a white count of 3900. Hemoglobin 12.7. Urinalysis is without signs of infection. Chest x-ray is unremarkable. Head CT is unremarkable. Patient received fluid bolus, Zofran and Tylenol. Recheck patient reports he's feeling much better. He reports his symptoms have resolved. He is tolerating by mouth without nausea or vomiting. I see no need for delta troponin is a patient has been having chest pain for 2 weeks. We'll discharge with Zyrtec, probiotic, naproxen and Zofran. I discussed return precautions. .I advised the patient to follow-up with their primary care provider this week. I advised the patient to return to the emergency department with new or worsening symptoms or new concerns. The patient verbalized understanding and agreement with plan.     Final Clinical Impressions(s) / ED Diagnoses   Final diagnoses:  Nausea vomiting and diarrhea  Viral upper respiratory tract infection  Bad headache  Intermittent tingling sensation of left hand and foot    New Prescriptions Discharge Medication List as of 10/14/2016  8:42 PM    START taking these medications   Details  cetirizine (ZYRTEC ALLERGY) 10 MG tablet Take 1 tablet (10 mg total) by mouth daily., Starting Tue 10/14/2016, Print    Lactobacillus (ACIDOPHILUS PROBIOTIC) 10 MG TABS Take 10 mg by mouth 3 (three) times daily., Starting Tue 10/14/2016, Print    ondansetron (ZOFRAN ODT) 4 MG disintegrating tablet Take 1 tablet (4 mg total) by mouth every 8 (eight) hours as needed for nausea or vomiting., Starting Tue 10/14/2016, Print       I personally performed the services described in this documentation, which was scribed in my presence. The recorded information has been reviewed and is accurate.       Everlene Farrier, PA-C 10/15/16 0047    Melene Plan, DO 10/15/16 1606

## 2016-10-14 NOTE — ED Triage Notes (Signed)
Pt reports multiple complaints for the past 3 weeks.  HA, Abd pain, Vomiting, diarrhea, cough, SOB, chest pain, head congestion, fatigue. Pt sts "I think I'm dying."  Pt in NAD.  Resp even and unlabored.  No cough noted at this time. Afebrile.

## 2016-10-14 NOTE — ED Triage Notes (Signed)
EMS transport from home. Per EMS report 12 lead was WNL. Pt reported having chest pain with push-ups and vomiting and sometimes his "pulse feels strong". Sx x 2 weeks. Pt ambulatory to triage. Cao x 4. No respiratory distress

## 2016-10-21 ENCOUNTER — Ambulatory Visit (HOSPITAL_COMMUNITY)
Admission: AD | Admit: 2016-10-21 | Discharge: 2016-10-21 | Disposition: A | Discharge status: Home / Self Care | Payer: 59 | Attending: Psychiatry | Admitting: Psychiatry

## 2016-10-21 DIAGNOSIS — Z811 Family history of alcohol abuse and dependence: Secondary | ICD-10-CM | POA: Diagnosis not present

## 2016-10-21 DIAGNOSIS — D573 Sickle-cell trait: Secondary | ICD-10-CM | POA: Insufficient documentation

## 2016-10-21 DIAGNOSIS — Z8249 Family history of ischemic heart disease and other diseases of the circulatory system: Secondary | ICD-10-CM | POA: Insufficient documentation

## 2016-10-21 DIAGNOSIS — Z888 Allergy status to other drugs, medicaments and biological substances status: Secondary | ICD-10-CM | POA: Diagnosis not present

## 2016-10-21 DIAGNOSIS — F411 Generalized anxiety disorder: Secondary | ICD-10-CM | POA: Diagnosis not present

## 2016-10-21 DIAGNOSIS — Z814 Family history of other substance abuse and dependence: Secondary | ICD-10-CM | POA: Diagnosis not present

## 2016-10-21 DIAGNOSIS — Z91013 Allergy to seafood: Secondary | ICD-10-CM | POA: Diagnosis not present

## 2016-10-21 NOTE — H&P (Signed)
Behavioral Health Medical Screening Exam  Larry Estes is an 26 y.o. male.  Total Time spent with patient: 15 minutes  Psychiatric Specialty Exam: Physical Exam  Constitutional: He is oriented to person, place, and time. He appears well-developed and well-nourished.  HENT:  Head: Normocephalic and atraumatic.  Neck: Normal range of motion.  Cardiovascular: Normal rate, regular rhythm and normal heart sounds.   Respiratory: Effort normal and breath sounds normal.  GI: Soft. Bowel sounds are normal.  Musculoskeletal: Normal range of motion.  Neurological: He is alert and oriented to person, place, and time.  Skin: Skin is warm and dry.    Review of Systems  Psychiatric/Behavioral: Negative for depression, hallucinations, memory loss, substance abuse and suicidal ideas. The patient is nervous/anxious. The patient does not have insomnia.     BP 120/64 (BP Location: Left Arm)   Pulse (!) 51   Resp 18   SpO2 100%    General Appearance: Casual and Fairly Groomed  Eye Contact:  Good  Speech:  Clear and Coherent and Normal Rate  Volume:  Normal  Mood:  Anxious  Affect:  Congruent  Thought Process:  Coherent, Goal Directed and Linear  Orientation:  Full (Time, Place, and Person)  Thought Content:  Logical  Suicidal Thoughts:  No  Homicidal Thoughts:  No  Memory:  Immediate;   Good Recent;   Good Remote;   Fair  Judgement:  Good  Insight:  Good  Psychomotor Activity:  Normal  Concentration: Concentration: Good and Attention Span: Good  Recall:  Good  Fund of Knowledge:Good  Language: Good  Akathisia:  No  Handed:  Right  AIMS (if indicated):     Assets:  Communication Skills Desire for Improvement Financial Resources/Insurance Housing Intimacy Leisure Time Physical Health Resilience Social Support Transportation  Sleep:       Musculoskeletal: Strength & Muscle Tone: within normal limits Gait & Station: normal Patient leans: N/A  BP 120/64 (BP Location: Left  Arm)   Pulse (!) 51   Resp 18   SpO2 100%   Recommendations:  Based on my evaluation the patient does not appear to have an emergency medical condition.  Laveda AbbeLaurie Britton Cherylin Waguespack, NP 10/21/2016, 6:22 PM

## 2016-10-21 NOTE — BH Assessment (Signed)
Tele Assessment Note  Pt presents voluntarily to [facility] for assessment. He is pleasant and oriented x 4. Pt reports physical symptoms since Nov 2017 of palpitations, SOB, blurred vision, severe chest pains and fatigue. He reports he works two jobs and is the breadwinner for his mom, 25 yo brother and 85 yo niece with whom he lives. Pt reports stress at home, at both jobs and with in his social life (girlfriend of 3 yrs and two girls on the side). Pt reports he drinks occasionally and he had one 12 oz beer and one shot 10/20/16. Pt report some fatigue. He reports he is a happy person other than his anxiety. He sts he tries to manage the anxiety by working out. Pt denies SI currently or at any time in the past. Pt denies any history of suicide attempts or of self-mutilation.Pt denies homicidal thoughts or physical aggression. Pt denies having access to firearms. Pt denies having any legal problems at this time. He reports his mom and dad both have severe anxiety. He sts his dad is an alcoholic and there is substance abuse on both sides of the family. Pt sts his PCP prescribed Lexapro and Diazepam, but pt sts he only took them for 3 mos. Pt sts his anxiety was greatly reduced, but he didn't want to rely on the meds so he stopped taking them. Pt denies hallucinations. Pt does not appear to be responding to internal stimuli and exhibits no delusional thought. Pt's reality testing appears to be intact. Pt denies any current or past substance abuse problems. Pt does not appear to be intoxicated or in withdrawal at this time.   Larry Estes is an 26 y.o. male.   Diagnosis: Generalized Anxiety Disorder  Past Medical History:  Past Medical History:  Diagnosis Date  . Allergic angiitis (HCC)   . Allergy   . Anxiety   . Headache 12/26/2015  . Multiple allergies   . Sickle cell trait (HCC)     No past surgical history on file.  Family History:  Family History  Problem Relation Age of Onset  .  Hypertension Mother   . Hypertension Maternal Grandmother   . Hypertension Paternal Grandmother   . CAD Other   . Hypertension Other   . Diabetes Other     Social History:  reports that he has never smoked. He has never used smokeless tobacco. He reports that he drinks alcohol. He reports that he does not use drugs.  Additional Social History:  Alcohol / Drug Use Pain Medications: pt denies abuse - see pta meds list Prescriptions: pt denies abuse - see pta meds list Over the Counter: pt denies abuse - see pta meds list History of alcohol / drug use?: Yes Longest period of sobriety (when/how long): n/a Substance #1 Name of Substance 1: etoh 1 - Age of First Use: 21 1 - Frequency: occasionally 1 - Last Use / Amount: 1/15 - 12 oz beer and one shot  CIWA: CIWA-Ar BP: 120/64 Pulse Rate: (!) 51 COWS:    PATIENT STRENGTHS: (choose at least two) Ability for insight Active sense of humor Average or above average intelligence Capable of independent living Licensed conveyancer Motivation for treatment/growth Physical Health Work skills  Allergies:  Allergies  Allergen Reactions  . Fish Allergy Anaphylaxis, Hives and Itching  . Tramadol Anaphylaxis, Itching and Nausea And Vomiting    Home Medications:  (Not in a hospital admission)  OB/GYN Status:  No LMP for male patient.  General Assessment Data Location of Assessment: [facility] Assessment Services TTS Assessment: In system Is this a Tele or Face-to-Face Assessment?: Face-to-Face Is this an Initial Assessment or a Re-assessment for this encounter?: Initial Assessment Marital status: Long term relationship Juanell Fairly name: non Is patient pregnant?: No Pregnancy Status: No Living Arrangements: Other relatives, Parent (mom, older bro (30), niece (8)) Can pt return to current living arrangement?: Yes Admission Status: Voluntary Is patient capable of signing voluntary admission?: Yes Referral Source:  Self/Family/Friend Insurance type: Product/process development scientist Exam [facility] Walk-in ONLY) Medical Exam completed: Yes  Crisis Care Plan Living Arrangements: Other relatives, Parent (mom, older bro (30), niece (8))  Education Status Is patient currently in school?: No Highest grade of school patient has completed: 42 Name of school: [employer]  Risk to self with the past 6 months Suicidal Ideation: No Has patient been a risk to self within the past 6 months prior to admission? : Other (comment) Suicidal Intent: No Has patient had any suicidal intent within the past 6 months prior to admission? : No Is patient at risk for suicide?: No Suicidal Plan?: No Has patient had any suicidal plan within the past 6 months prior to admission? : No Access to Means: No What has been your use of drugs/alcohol within the last 12 months?: pt reports occasional alcohol use Previous Attempts/Gestures: No How many times?: 0 Other Self Harm Risks: none Triggers for Past Attempts:  (n/a) Intentional Self Injurious Behavior: None Family Suicide History: No (hx of anxiety and substance abuse on both sides) Recent stressful life event(s): Other (Comment) (physical symptoms that may be anxiety) Persecutory voices/beliefs?: No Depression: No Depression Symptoms: Fatigue Substance abuse history and/or treatment for substance abuse?: No Suicide prevention information given to non-admitted patients: Not applicable  Risk to Others within the past 6 months Homicidal Ideation: No Does patient have any lifetime risk of violence toward others beyond the six months prior to admission? : No Thoughts of Harm to Others: No Current Homicidal Intent: No Current Homicidal Plan: No Access to Homicidal Means: No Identified Victim: none History of harm to others?: No Assessment of Violence: None Noted Violent Behavior Description: pt denies hx violence - is cooperative Does patient have access to weapons?: No Criminal Charges  Pending?: No Does patient have a court date: No Is patient on probation?: No  Psychosis Hallucinations: None noted Delusions: None noted  Mental Status Report Appearance/Hygiene: Unremarkable (in appropriate clothing for weather) Eye Contact: Good Motor Activity: Freedom of movement Speech: Logical/coherent Level of Consciousness: Alert Mood: Anxious Affect:  (euthymic) Anxiety Level: Severe Thought Processes: Relevant, Coherent Judgement: Unimpaired Orientation: Person, Place, Time, Situation Obsessive Compulsive Thoughts/Behaviors: None  Cognitive Functioning Concentration: Normal Memory: Recent Intact, Remote Intact IQ: Average Insight: Fair Impulse Control: Good Appetite: Good Sleep: Decreased Total Hours of Sleep: 4 Vegetative Symptoms: None  ADLScreening [facility] Assessment Services) Patient's cognitive ability adequate to safely complete daily activities?: Yes Patient able to express need for assistance with ADLs?: Yes Independently performs ADLs?: Yes (appropriate for developmental age)  Prior Inpatient Therapy Prior Inpatient Therapy: No  Prior Outpatient Therapy Prior Outpatient Therapy: No Does patient have an ACCT team?: No Does patient have Intensive In-House Services?  : No Does patient have [facility] services? : No Does patient have P4CC services?: No  ADL Screening (condition at time of admission) Patient's cognitive ability adequate to safely complete daily activities?: Yes Is the patient deaf or have difficulty hearing?: No Does the patient have difficulty seeing, even when wearing  glasses/contacts?: Yes (pt sts blurry vision sometimes) Does the patient have difficulty concentrating, remembering, or making decisions?: No Patient able to express need for assistance with ADLs?: Yes Does the patient have difficulty dressing or bathing?: Yes Independently performs ADLs?: Yes (appropriate for developmental age) Does the patient have difficulty walking or  climbing stairs?: No Weakness of Legs: None Weakness of Arms/Hands: None  Home Assistive Devices/Equipment Home Assistive Devices/Equipment: None    Abuse/Neglect Assessment (Assessment to be complete while patient is alone) Physical Abuse: Denies Verbal Abuse: Denies Sexual Abuse: Denies Exploitation of patient/patient's resources: Denies Self-Neglect: Denies     Merchant navy officerAdvance Directives (For Healthcare) Does Patient Have a Medical Advance Directive?: No Would patient like information on creating a medical advance directive?: No - Patient declined    Additional Information 1:1 In Past 12 Months?: No CIRT Risk: No Elopement Risk: No Does patient have medical clearance?: No     Disposition:  Disposition Initial Assessment Completed for this Encounter: Yes Disposition of Patient: Outpatient treatment Type of outpatient treatment: Adult ([facility]   Writer ran pt by Elta GuadeloupeLaurie Parks NP who agrees with writer that pt doesn't meet criteria for inpatient treatment. Writer [facility]. Pt sts he will definitely call them tomorrow.  Abdirahim Flavell P 10/21/2016 6:50 PM

## 2016-10-23 ENCOUNTER — Encounter (HOSPITAL_COMMUNITY): Payer: Self-pay | Admitting: Pharmacy Technician

## 2016-10-23 ENCOUNTER — Emergency Department (HOSPITAL_COMMUNITY): Payer: 59

## 2016-10-23 ENCOUNTER — Emergency Department (HOSPITAL_COMMUNITY)
Admission: EM | Admit: 2016-10-23 | Discharge: 2016-10-23 | Disposition: A | Discharge status: Home / Self Care | Payer: 59 | Attending: Emergency Medicine | Admitting: Emergency Medicine

## 2016-10-23 DIAGNOSIS — F419 Anxiety disorder, unspecified: Secondary | ICD-10-CM | POA: Insufficient documentation

## 2016-10-23 DIAGNOSIS — Z7982 Long term (current) use of aspirin: Secondary | ICD-10-CM | POA: Insufficient documentation

## 2016-10-23 DIAGNOSIS — R0789 Other chest pain: Secondary | ICD-10-CM

## 2016-10-23 LAB — BASIC METABOLIC PANEL
ANION GAP: 10 (ref 5–15)
BUN: 9 mg/dL (ref 6–20)
CALCIUM: 9.6 mg/dL (ref 8.9–10.3)
CHLORIDE: 103 mmol/L (ref 101–111)
CO2: 26 mmol/L (ref 22–32)
Creatinine, Ser: 0.86 mg/dL (ref 0.61–1.24)
GFR calc non Af Amer: >60 mL/min (ref >60)
Glucose, Bld: 93 mg/dL (ref 65–99)
POTASSIUM: 3.8 mmol/L (ref 3.5–5.1)
Sodium: 139 mmol/L (ref 135–145)

## 2016-10-23 LAB — I-STAT TROPONIN, ED: Troponin i, poc: 0 ng/mL (ref 0.00–0.08)

## 2016-10-23 LAB — CBC
HEMATOCRIT: 41.5 % (ref 39.0–52.0)
HEMOGLOBIN: 14.4 g/dL (ref 13.0–17.0)
MCH: 28.7 pg (ref 26.0–34.0)
MCHC: 34.7 g/dL (ref 30.0–36.0)
MCV: 82.8 fL (ref 78.0–100.0)
Platelets: 256 10*3/uL (ref 150–400)
RBC: 5.01 MIL/uL (ref 4.22–5.81)
RDW: 13.6 % (ref 11.5–15.5)
WBC: 4.5 10*3/uL (ref 4.0–10.5)

## 2016-10-23 LAB — TSH: TSH: 3.48 u[IU]/mL (ref 0.350–4.500)

## 2016-10-23 MED ORDER — ETODOLAC 400 MG PO TABS: 400.0000 mg | ORAL_TABLET | Freq: Two times a day (BID) | ORAL | 0 refills | Status: DC | PRN

## 2016-10-23 MED ORDER — HYDROXYZINE HCL 25 MG PO TABS
25.0000 mg | ORAL_TABLET | Freq: Once | ORAL | Status: AC
  Administered 2016-10-23: 25 mg via ORAL
  Filled 2016-10-23: qty 1

## 2016-10-23 MED ORDER — HYDROCODONE-ACETAMINOPHEN 5-325 MG PO TABS
1.0000 | ORAL_TABLET | Freq: Once | ORAL | Status: AC
  Administered 2016-10-23: 1 via ORAL
  Filled 2016-10-23: qty 1

## 2016-10-23 MED ORDER — IBUPROFEN 800 MG PO TABS
800.0000 mg | ORAL_TABLET | Freq: Once | ORAL | Status: AC
Start: 2016-10-23 — End: 2016-10-23
  Administered 2016-10-23: 800 mg via ORAL
  Filled 2016-10-23: qty 1

## 2016-10-23 MED ORDER — CEPHALEXIN 250 MG PO CAPS: 1000.0000 mg | ORAL_CAPSULE | Freq: Once | ORAL | Status: DC

## 2016-10-23 NOTE — ED Triage Notes (Signed)
Pt arrives to the ED via [facility] with reports of L sided non radiating cp starting approx 5 hours ago. Pt was hypertensive and tachycardic initially with EMS. Pt with a hx of anxiety and has been off of his medications approx 6 months.

## 2016-10-23 NOTE — ED Notes (Signed)
Patient transported to X-ray 

## 2016-10-23 NOTE — ED Provider Notes (Signed)
[facility]-EMERGENCY DEPT Provider Note   CSN: [id number] Arrival date & time: 10/23/16  0023   By signing my name below, I, Clovis Pu, attest that this documentation has been prepared under the direction and in the presence of Gilda Crease, MD  Electronically Signed: Clovis Pu, ED Scribe. 10/23/16. 12:31 AM.   History   Chief Complaint Chief Complaint  Patient presents with  . Chest Pain    The history is provided by the patient. No language interpreter was used.   HPI Comments:  Larry Estes is a 26 y.o. male who presents to the Emergency Department complaining of recurrent chest pain x 3-4 months. He notes these episodes usually last for a couple of hours. Pt also reports intermittent palpitations, SOB and diaphoresis. He notes stress can be a trigger for his symptoms. He usually takes tylenol for his pain with mild relief. Pt denies a hx of HTN, hx of DM and any other associated symptoms at this time.   Past Medical History:  Diagnosis Date  . Allergic angiitis (HCC)   . Allergy   . Anxiety   . Headache 12/26/2015  . Multiple allergies   . Sickle cell trait [facility])     Patient Active Problem List   Diagnosis Date Noted  . Headache 12/26/2015  . Myalgia 12/11/2015    History reviewed. No pertinent surgical history.     Home Medications    Prior to Admission medications   Medication Sig Start Date End Date Taking? Authorizing Provider  Aspirin-Acetaminophen (GOODY BODY PAIN) 500-325 MG PACK Take 1 each by mouth every 8 (eight) hours as needed (pain).    Historical Provider, MD  cetirizine (ZYRTEC ALLERGY) 10 MG tablet Take 1 tablet (10 mg total) by mouth daily. 10/14/16   Everlene Farrier, PA-C  Cyanocobalamin (VITAMIN B 12 PO) Take 2 tablets by mouth daily.    Historical Provider, MD  diazepam (VALIUM) 5 MG tablet Take 1 tablet (5 mg total) by mouth every 12 (twelve) hours as needed for anxiety. Patient not taking: Reported on 09/22/2016 06/25/16   Gerhard Munch, MD  etodolac (LODINE) 400 MG tablet Take 1 tablet (400 mg total) by mouth 2 (two) times daily as needed for moderate pain. 10/23/16   Gilda Crease, MD  hydrOXYzine (ATARAX/VISTARIL) 25 MG tablet Take 1 tablet (25 mg total) by mouth every 8 (eight) hours as needed for anxiety. Patient not taking: Reported on 09/26/2016 09/23/16   Tilden Fossa, MD  Lactobacillus (ACIDOPHILUS PROBIOTIC) 10 MG TABS Take 10 mg by mouth 3 (three) times daily. 10/14/16   Everlene Farrier, PA-C  naproxen (NAPROSYN) 250 MG tablet Take 1 tablet (250 mg total) by mouth 2 (two) times daily with a meal. 10/14/16   Everlene Farrier, PA-C  ondansetron (ZOFRAN ODT) 4 MG disintegrating tablet Take 1 tablet (4 mg total) by mouth every 8 (eight) hours as needed for nausea or vomiting. 10/14/16   Everlene Farrier, PA-C  Pyridoxine HCl (VITAMIN B6 PO) Take 2 tablets by mouth daily.    Historical Provider, MD  topiramate (TOPAMAX) 25 MG tablet Take one tablet at night for one week, then take 2 tablets at night for one week, then take 3 tablets at night. Patient not taking: Reported on 09/26/2016 02/25/16   York Spaniel, MD  VITAMIN A PO Take 2 tablets by mouth daily.    Historical Provider, MD  VITAMIN E PO Take 1 tablet by mouth daily.    Historical Provider, MD  Family History Family History  Problem Relation Age of Onset  . Hypertension Mother   . Hypertension Maternal Grandmother   . Hypertension Paternal Grandmother   . CAD Other   . Hypertension Other   . Diabetes Other     Social History Social History  Substance Use Topics  . Smoking status: Never Smoker  . Smokeless tobacco: Never Used  . Alcohol use Yes     Comment: socially     Allergies   Fish allergy and Tramadol   Review of Systems Review of Systems  Respiratory: Positive for shortness of breath.   Cardiovascular: Positive for chest pain and palpitations.  All other systems reviewed and are negative.    Physical Exam Updated Vital  Signs BP 112/59   Pulse (!) 50   Temp 97.9 F (36.6 C) (Oral)   Resp 14   Ht $RemoveBef [id number]$5\' 7"26.31 kg/m   Physical Exam  Constitutional: He is oriented to person, place, and time. He appears well-developed and well-nourished. No distress.  HENT:  Head: Normocephalic and atraumatic.  Right Ear: Hearing normal.  Left Ear: Hearing normal.  Nose: Nose normal.  Mouth/Throat: Oropharynx is clear and moist and mucous membranes are normal.  Eyes: Conjunctivae and EOM are normal. Pupils are equal, round, and reactive to light.  Neck: Normal range of motion. Neck supple.  Cardiovascular: Regular rhythm, S1 normal and S2 normal.  Exam reveals no gallop and no friction rub.   No murmur heard. Pulmonary/Chest: Effort normal and breath sounds normal. No respiratory distress. He exhibits no tenderness.  Abdominal: Soft. Normal appearance and bowel sounds are normal. There is no hepatosplenomegaly. There is no tenderness. There is no rebound, no guarding, no tenderness at McBurney's point and negative Murphy's sign. No hernia.  Musculoskeletal: Normal range of motion.  Neurological: He is alert and oriented to person, place, and time. He has normal strength. No cranial nerve deficit or sensory deficit. Coordination normal. GCS eye subscore is 4. GCS verbal subscore is 5. GCS motor subscore is 6.  Skin: Skin is warm, dry and intact. No rash noted. No cyanosis.  Psychiatric: He has a normal mood and affect. His speech is normal and behavior is normal. Thought content normal.  Nursing note and vitals reviewed.    ED Treatments / Results  DIAGNOSTIC STUDIES:  Oxygen Saturation is 98% on RA, normal by my interpretation.    COORDINATION OF CARE:  12:31 AM Discussed treatment plan with pt at bedside and pt agreed to plan.  Labs (all labs ordered are listed, but only abnormal results are displayed) Labs Reviewed  CBC  BASIC METABOLIC PANEL  TSH  I-STAT  TROPOININ, ED    EKG  EKG Interpretation  Date/Time:  Thursday October 23 2016 00:30:08 EST Ventricular Rate:  64 PR Interval:    QRS Duration: 92 QT Interval:  379 QTC Calculation: 391 R Axis:   76 Text Interpretation:  Sinus rhythm ST elev, probable normal early repol pattern Confirmed by POLLINA  MD, CHRISTOPHER [id number]) on 10/23/2016 12:43:37 AM       Radiology Dg Chest 2 View  Result Date: 10/23/2016 CLINICAL DATA:  Left side non radiating chest pain EXAM: CHEST  2 VIEW COMPARISON:  10/14/2016 FINDINGS: The heart size and mediastinal contours are within normal limits. Both lungs are clear. The visualized skeletal structures are unremarkable. IMPRESSION: No active cardiopulmonary disease. Electronically Signed   By:  Jasmine Pang M.D.   On: 10/23/2016 01:12    Procedures Procedures (including critical care time)  Medications Ordered in ED Medications - No data to display   Initial Impression / Assessment and Plan / ED Course  I have reviewed the triage vital signs and the nursing notes.  Pertinent labs & imaging results that were available during my care of the patient were reviewed by me and considered in my medical decision making (see chart for details).     She presents with complaints of chest pain. Patient reports that he has had intermittent episodes of chest pain that has been ongoing for 6 months. When the pain occurs, he experiences shortness of breath and racing heartbeat as well. He reports previous cardiac workup including echo. He has also had an MRI of his brain for these symptoms. Patient appears anxious. His cardiac evaluation is negative. Mother reports that she has a history of thyroid and had similar symptoms, was concerned about thyroid abnormality. TSH was normal today. He then became concerned about his liver. Patient was seen a week ago for similar symptoms and had normal LFTs at this time. Patient was reassured, likely experiencing anxiety and panic  attacks.. Needs to follow-up with behavioral health and establish a primary care physician.  Final Clinical Impressions(s) / ED Diagnoses   Final diagnoses:  Atypical chest pain  Anxiety    New Prescriptions New Prescriptions   ETODOLAC (LODINE) 400 MG TABLET    Take 1 tablet (400 mg total) by mouth 2 (two) times daily as needed for moderate pain.  I personally performed the services described in this documentation, which was scribed in my presence. The recorded information has been reviewed and is accurate.     Gilda Crease, MD 10/23/16 [phone]

## 2016-10-28 ENCOUNTER — Emergency Department (HOSPITAL_COMMUNITY): Payer: Managed Care, Other (non HMO)

## 2016-10-28 ENCOUNTER — Encounter (HOSPITAL_COMMUNITY): Payer: Self-pay | Admitting: Emergency Medicine

## 2016-10-28 ENCOUNTER — Emergency Department (HOSPITAL_COMMUNITY)
Admission: EM | Admit: 2016-10-28 | Discharge: 2016-10-28 | Disposition: A | Discharge status: Home / Self Care | Payer: Managed Care, Other (non HMO) | Attending: Emergency Medicine | Admitting: Emergency Medicine

## 2016-10-28 DIAGNOSIS — Z79899 Other long term (current) drug therapy: Secondary | ICD-10-CM | POA: Insufficient documentation

## 2016-10-28 DIAGNOSIS — Y990 Civilian activity done for income or pay: Secondary | ICD-10-CM | POA: Diagnosis not present

## 2016-10-28 DIAGNOSIS — S99921A Unspecified injury of right foot, initial encounter: Secondary | ICD-10-CM | POA: Diagnosis present

## 2016-10-28 DIAGNOSIS — Y929 Unspecified place or not applicable: Secondary | ICD-10-CM | POA: Insufficient documentation

## 2016-10-28 DIAGNOSIS — S9031XA Contusion of right foot, initial encounter: Secondary | ICD-10-CM | POA: Insufficient documentation

## 2016-10-28 DIAGNOSIS — Z7982 Long term (current) use of aspirin: Secondary | ICD-10-CM | POA: Diagnosis not present

## 2016-10-28 DIAGNOSIS — Y939 Activity, unspecified: Secondary | ICD-10-CM | POA: Diagnosis not present

## 2016-10-28 DIAGNOSIS — W208XXA Other cause of strike by thrown, projected or falling object, initial encounter: Secondary | ICD-10-CM | POA: Insufficient documentation

## 2016-10-28 NOTE — ED Provider Notes (Signed)
[facility]-EMERGENCY DEPT Provider Note   CSN: [id number] Arrival date & time: 10/28/16  4098     History   Chief Complaint Chief Complaint  Patient presents with  . Foot Injury    HPI Larry Estes is a 26 y.o. male.  The history is provided by the patient.  Foot Pain  This is a new problem. The current episode started 1 to 2 hours ago. The problem occurs constantly. The problem has not changed since onset.The symptoms are aggravated by walking. Nothing relieves the symptoms. He has tried nothing for the symptoms.    Past Medical History:  Diagnosis Date  . Allergic angiitis (HCC)   . Allergy   . Anxiety   . Headache 12/26/2015  . Multiple allergies   . Sickle cell trait [facility])     Patient Active Problem List   Diagnosis Date Noted  . Headache 12/26/2015  . Myalgia 12/11/2015    History reviewed. No pertinent surgical history.     Home Medications    Prior to Admission medications   Medication Sig Start Date End Date Taking? Authorizing Provider  Aspirin-Acetaminophen (GOODY BODY PAIN) 500-325 MG PACK Take 1 each by mouth every 8 (eight) hours as needed (pain).   Yes Historical Provider, MD  Cholecalciferol (VITAMIN D PO) Take 1 tablet by mouth.   Yes Historical Provider, MD  Cyanocobalamin (VITAMIN B 12 PO) Take 2 tablets by mouth daily.   Yes Historical Provider, MD  ibuprofen (ADVIL,MOTRIN) 200 MG tablet Take 400 mg by mouth every 6 (six) hours as needed for mild pain or moderate pain.   Yes Historical Provider, MD  Lactobacillus (ACIDOPHILUS PROBIOTIC) 10 MG TABS Take 10 mg by mouth 3 (three) times daily. 10/14/16  Yes Everlene Farrier, PA-C  multivitamin (ONE-A-DAY MEN'S) TABS tablet Take 1 tablet by mouth daily.   Yes Historical Provider, MD  Pyridoxine HCl (VITAMIN B6 PO) Take 2 tablets by mouth daily.   Yes Historical Provider, MD  VITAMIN A PO Take 2 tablets by mouth daily.   Yes Historical Provider, MD  vitamin C (ASCORBIC ACID) 500 MG tablet Take 500 mg by  mouth daily.   Yes Historical Provider, MD  vitamin E 400 UNIT capsule Take 800 Units by mouth daily.    Yes Historical Provider, MD  cetirizine (ZYRTEC ALLERGY) 10 MG tablet Take 1 tablet (10 mg total) by mouth daily. Patient not taking: Reported on 10/28/2016 10/14/16   Everlene Farrier, PA-C  diazepam (VALIUM) 5 MG tablet Take 1 tablet (5 mg total) by mouth every 12 (twelve) hours as needed for anxiety. Patient not taking: Reported on 09/22/2016 06/25/16   Gerhard Munch, MD  etodolac (LODINE) 400 MG tablet Take 1 tablet (400 mg total) by mouth 2 (two) times daily as needed for moderate pain. Patient not taking: Reported on 10/28/2016 10/23/16   Gilda Crease, MD  hydrOXYzine (ATARAX/VISTARIL) 25 MG tablet Take 1 tablet (25 mg total) by mouth every 8 (eight) hours as needed for anxiety. Patient not taking: Reported on 09/26/2016 09/23/16   Tilden Fossa, MD  naproxen (NAPROSYN) 250 MG tablet Take 1 tablet (250 mg total) by mouth 2 (two) times daily with a meal. Patient not taking: Reported on 10/28/2016 10/14/16   Everlene Farrier, PA-C  ondansetron (ZOFRAN ODT) 4 MG disintegrating tablet Take 1 tablet (4 mg total) by mouth every 8 (eight) hours as needed for nausea or vomiting. Patient not taking: Reported on 10/28/2016 10/14/16   Everlene Farrier, PA-C  topiramate (TOPAMAX) 25  MG tablet Take one tablet at night for one week, then take 2 tablets at night for one week, then take 3 tablets at night. Patient not taking: Reported on 09/26/2016 02/25/16   York Spanielharles K Willis, MD    Family History Family History  Problem Relation Age of Onset  . Hypertension Mother   . Hypertension Maternal Grandmother   . Hypertension Paternal Grandmother   . CAD Other   . Hypertension Other   . Diabetes Other     Social History Social History  Substance Use Topics  . Smoking status: Never Smoker  . Smokeless tobacco: Never Used  . Alcohol use Yes     Comment: socially     Allergies   Fish allergy and  Tramadol   Review of Systems Review of Systems  All other systems reviewed and are negative.    Physical Exam Updated Vital Signs BP 126/83 (BP Location: Left Arm)   Pulse (!) 52   Temp 98.2 F (36.8 C) (Oral)   Resp 16   SpO2 100%   Physical Exam  Constitutional: He is oriented to person, place, and time. He appears well-developed and well-nourished. No distress.  HENT:  Head: Normocephalic and atraumatic.  Nose: Nose normal.  Eyes: Conjunctivae are normal.  Neck: Neck supple. No tracheal deviation present.  Cardiovascular: Normal rate and regular rhythm.   Pulmonary/Chest: Effort normal. No respiratory distress.  Abdominal: Soft. He exhibits no distension.  Musculoskeletal:       Right foot: There is tenderness (mild dorsal foot with associated 1 cm raised mobile lesion). There is normal range of motion, normal capillary refill, no deformity and no laceration.  Neurological: He is alert and oriented to person, place, and time.  Skin: Skin is warm and dry.  Psychiatric: He has a normal mood and affect.  Vitals reviewed.    ED Treatments / Results  Labs (all labs ordered are listed, but only abnormal results are displayed) Labs Reviewed - No data to display  EKG  EKG Interpretation None       Radiology Dg Foot Complete Right  Result Date: 10/28/2016 CLINICAL DATA:  Dropped box onto right foot Saturday. Right foot pain. Initial encounter. EXAM: RIGHT FOOT COMPLETE - 3+ VIEW COMPARISON:  None. FINDINGS: Negative for acute fracture or malalignment. Dorsal midfoot soft tissue swelling without opaque foreign body. Benign cortically based peripherally sclerotic lesion in the distal tibia which was more completely visualized on 2015 ankle film. No detectable change since that time. IMPRESSION: No acute osseous finding. Electronically Signed   By: Marnee SpringJonathon  Watts M.D.   On: 10/28/2016 07:57    Procedures Procedures (including critical care time)  Medications Ordered  in ED Medications - No data to display   Initial Impression / Assessment and Plan / ED Course  I have reviewed the triage vital signs and the nursing notes.  Pertinent labs & imaging results that were available during my care of the patient were reviewed by me and considered in my medical decision making (see chart for details).     26 year old male presents after dropping something on his foot at work. He has a small area of swelling over his dorsal foot which may represent a small hematoma or small cyst which he may have incidentally noticed.  Pt given instructions for supportive care including NSAIDs, rest, ice, compression, and elevation to help alleviate symptoms. Patient needs to establish primary care in the area and was provided contact information to do so.   Final  Clinical Impressions(s) / ED Diagnoses   Final diagnoses:  Contusion of right foot, initial encounter    New Prescriptions New Prescriptions   No medications on file     Lyndal Pulley, MD 10/28/16 [phone]

## 2016-10-28 NOTE — ED Notes (Signed)
Pt reports 10/10 left sided cp w/ radiation to neck. Pt requested to be placed on cardiac monitor. Pt non-diaphoretic, denies nausea. HR 56 and Sinus Brady Rhythm.

## 2016-10-28 NOTE — ED Triage Notes (Signed)
Per pt, states he was at work and heard a pop regarding his right foot-states he dropped a box on top of his right foot-swollen, palpable mass

## 2016-11-04 ENCOUNTER — Other Ambulatory Visit (HOSPITAL_COMMUNITY): Payer: 59 | Attending: Psychiatry

## 2016-11-11 ENCOUNTER — Encounter (HOSPITAL_COMMUNITY): Payer: Self-pay | Admitting: Emergency Medicine

## 2016-11-11 DIAGNOSIS — Z7982 Long term (current) use of aspirin: Secondary | ICD-10-CM | POA: Insufficient documentation

## 2016-11-11 DIAGNOSIS — R51 Headache: Secondary | ICD-10-CM | POA: Diagnosis present

## 2016-11-11 DIAGNOSIS — Z79899 Other long term (current) drug therapy: Secondary | ICD-10-CM | POA: Insufficient documentation

## 2016-11-11 LAB — CBC
HEMATOCRIT: 39.3 % (ref 39.0–52.0)
HEMOGLOBIN: 13.7 g/dL (ref 13.0–17.0)
MCH: 29.1 pg (ref 26.0–34.0)
MCHC: 34.9 g/dL (ref 30.0–36.0)
MCV: 83.6 fL (ref 78.0–100.0)
Platelets: 235 10*3/uL (ref 150–400)
RBC: 4.7 MIL/uL (ref 4.22–5.81)
RDW: 13.6 % (ref 11.5–15.5)
WBC: 4.4 10*3/uL (ref 4.0–10.5)

## 2016-11-11 LAB — BASIC METABOLIC PANEL
Anion gap: 6 (ref 5–15)
BUN: 9 mg/dL (ref 6–20)
CHLORIDE: 105 mmol/L (ref 101–111)
CO2: 26 mmol/L (ref 22–32)
Calcium: 9.1 mg/dL (ref 8.9–10.3)
Creatinine, Ser: 0.98 mg/dL (ref 0.61–1.24)
GFR calc non Af Amer: >60 mL/min (ref >60)
Glucose, Bld: 111 mg/dL — ABNORMAL HIGH (ref 65–99)
POTASSIUM: 3.4 mmol/L — AB (ref 3.5–5.1)
SODIUM: 137 mmol/L (ref 135–145)

## 2016-11-11 LAB — CBG MONITORING, ED: Glucose-Capillary: 108 mg/dL — ABNORMAL HIGH (ref 65–99)

## 2016-11-11 NOTE — ED Triage Notes (Signed)
Pt was at work this evening and became dizzy and his head started pounding and he thought he was going to faint.  Stated he thought he was going to die and told his supervisor he needed to come on over to the ER.  Pt states he drove here.  Endorses headaches regularly but the symptoms are different this time.

## 2016-11-12 ENCOUNTER — Emergency Department (HOSPITAL_COMMUNITY)
Admission: EM | Admit: 2016-11-12 | Discharge: 2016-11-12 | Disposition: A | Discharge status: Home / Self Care | Payer: Managed Care, Other (non HMO) | Attending: Emergency Medicine | Admitting: Emergency Medicine

## 2016-11-12 DIAGNOSIS — R519 Headache, unspecified: Secondary | ICD-10-CM

## 2016-11-12 DIAGNOSIS — R51 Headache: Secondary | ICD-10-CM

## 2016-11-12 MED ORDER — TOPIRAMATE 25 MG PO TABS: ORAL_TABLET | ORAL | 0 refills | Status: DC

## 2016-11-12 MED ORDER — KETOROLAC TROMETHAMINE 30 MG/ML IJ SOLN
30.0000 mg | Freq: Once | INTRAMUSCULAR | Status: AC
  Administered 2016-11-12: 30 mg via INTRAVENOUS
  Filled 2016-11-12: qty 1

## 2016-11-12 MED ORDER — PROCHLORPERAZINE EDISYLATE 5 MG/ML IJ SOLN
10.0000 mg | Freq: Once | INTRAMUSCULAR | Status: AC
  Administered 2016-11-12: 10 mg via INTRAVENOUS
  Filled 2016-11-12: qty 2

## 2016-11-12 MED ORDER — SODIUM CHLORIDE 0.9 % IV BOLUS (SEPSIS)
1000.0000 mL | Freq: Once | INTRAVENOUS | Status: AC
  Administered 2016-11-12: 1000 mL via INTRAVENOUS

## 2016-11-12 MED ORDER — SUMATRIPTAN SUCCINATE 25 MG PO TABS: 25.0000 mg | ORAL_TABLET | Freq: Once | ORAL | 0 refills | Status: DC | PRN

## 2016-11-12 MED ORDER — DIPHENHYDRAMINE HCL 50 MG/ML IJ SOLN
25.0000 mg | Freq: Once | INTRAMUSCULAR | Status: AC
  Administered 2016-11-12: 25 mg via INTRAVENOUS
  Filled 2016-11-12: qty 1

## 2016-11-12 NOTE — ED Provider Notes (Signed)
[facility]-EMER[facility] DEPT Provider Note   CSN: [id number] Arrival date & time: 11/11/16  2302     History   Chief Complaint Chief Complaint  Patient presents with  . Near Syncope    HPI Larry Estes is a 26 y.o. male.  HPI   Pt with hx chronic headaches, previously on topamax and sumatriptan p/w right temporal headache that is sharp and constant, associated blurry vision, N/V that began around 10:45pm yesterday.  While he was vomiting he felt his legs shaking and felt weak.  He left work and came to ED, on the way he developed chest pain that felt like his previous anxiety.  States this feels like his previous headaches with exception of new nausea and vomiting.  Has headaches up to 8 times daily.  Has seen neurology and had CT and MRI for same.  It is always sharp and in the right temple, presenting similarly to today.   Past Medical History:  Diagnosis Date  . Allergic angiitis (HCC)   . Allergy   . Anxiety   . Headache 12/26/2015  . Multiple allergies   . Sickle cell trait [facility](HCC)     Patient Active Problem List   Diagnosis Date Noted  . Headache 12/26/2015  . Myalgia 12/11/2015    History reviewed. No pertinent surgical history.     Home Medications    Prior to Admission medications   Medication Sig Start Date End Date Taking? Authorizing Provider  Aspirin-Acetaminophen (GOODY BODY PAIN) 500-325 MG PACK Take 1 each by mouth every 8 (eight) hours as needed (pain).   Yes Historical Provider, MD  Cholecalciferol (VITAMIN D PO) Take 1 tablet by mouth.   Yes Historical Provider, MD  Cyanocobalamin (VITAMIN B 12 PO) Take 2 tablets by mouth daily.   Yes Historical Provider, MD  ibuprofen (ADVIL,MOTRIN) 200 MG tablet Take 400 mg by mouth every 6 (six) hours as needed for mild pain or moderate pain.   Yes Historical Provider, MD  multivitamin (ONE-A-DAY MEN'S) TABS tablet Take 1 tablet by mouth daily.   Yes Historical Provider, MD  Pyridoxine HCl (VITAMIN B6 PO) Take 2 tablets  by mouth daily.   Yes Historical Provider, MD  VITAMIN A PO Take 2 tablets by mouth daily.   Yes Historical Provider, MD  vitamin C (ASCORBIC ACID) 500 MG tablet Take 500 mg by mouth daily.   Yes Historical Provider, MD  vitamin E 400 UNIT capsule Take 800 Units by mouth daily.    Yes Historical Provider, MD  cetirizine (ZYRTEC ALLERGY) 10 MG tablet Take 1 tablet (10 mg total) by mouth daily. Patient not taking: Reported on 10/28/2016 10/14/16   Everlene FarrierWilliam Dansie, PA-C  diazepam (VALIUM) 5 MG tablet Take 1 tablet (5 mg total) by mouth every 12 (twelve) hours as needed for anxiety. Patient not taking: Reported on 09/22/2016 06/25/16   Gerhard Munchobert Lockwood, MD  etodolac (LODINE) 400 MG tablet Take 1 tablet (400 mg total) by mouth 2 (two) times daily as needed for moderate pain. Patient not taking: Reported on 10/28/2016 10/23/16   Gilda Creasehristopher J Pollina, MD  hydrOXYzine (ATARAX/VISTARIL) 25 MG tablet Take 1 tablet (25 mg total) by mouth every 8 (eight) hours as needed for anxiety. Patient not taking: Reported on 09/26/2016 09/23/16   Tilden FossaElizabeth Rees, MD  Lactobacillus (ACIDOPHILUS PROBIOTIC) 10 MG TABS Take 10 mg by mouth 3 (three) times daily. Patient not taking: Reported on 11/12/2016 10/14/16   Everlene FarrierWilliam Dansie, PA-C  naproxen (NAPROSYN) 250 MG tablet Take 1 tablet (  250 mg total) by mouth 2 (two) times daily with a meal. Patient not taking: Reported on 10/28/2016 10/14/16   Everlene Farrier, PA-C  ondansetron (ZOFRAN ODT) 4 MG disintegrating tablet Take 1 tablet (4 mg total) by mouth every 8 (eight) hours as needed for nausea or vomiting. Patient not taking: Reported on 10/28/2016 10/14/16   Everlene Farrier, PA-C  SUMAtriptan (IMITREX) 25 MG tablet Take 1 tablet (25 mg total) by mouth once as needed for migraine (may repeat once after two hours if needed). May repeat in 2 hours if headache persists or recurs. 11/12/16   Trixie Dredge, PA-C  topiramate (TOPAMAX) 25 MG tablet Take one tablet at night for one week, then take 2 tablets  at night for one week, then take 3 tablets at night. 11/12/16   Trixie Dredge, PA-C    Family History Family History  Problem Relation Age of Onset  . Hypertension Mother   . Hypertension Maternal Grandmother   . Hypertension Paternal Grandmother   . CAD Other   . Hypertension Other   . Diabetes Other     Social History Social History  Substance Use Topics  . Smoking status: Never Smoker  . Smokeless tobacco: Never Used  . Alcohol use Yes     Comment: socially     Allergies   Fish allergy; Reglan [metoclopramide]; and Tramadol   Review of Systems Review of Systems  All other systems reviewed and are negative.    Physical Exam Updated Vital Signs BP 101/69   Pulse (!) 44   Temp 97.7 F (36.5 C) (Oral)   Resp 12   Ht 5\' 7"  (1.702 m)   Wt 77.1 kg   SpO2 99%   BMI 26.63 kg/m   Physical Exam  Constitutional: He appears well-developed and well-nourished. No distress.  HENT:  Head: Normocephalic and atraumatic.  Neck: Neck supple.  Cardiovascular: Normal rate and regular rhythm.   Pulmonary/Chest: Effort normal and breath sounds normal. No respiratory distress. He has no wheezes. He has no rales.  Abdominal: Soft. He exhibits no distension and no mass. There is no tenderness. There is no rebound and no guarding.  Neurological: He is alert. He exhibits normal muscle tone.  CN II-XII intact, EOMs intact, no pronator drift, grip strengths equal bilaterally; strength 5/5 in all extremities, sensation intact in all extremities; finger to nose, heel to shin, rapid alternating movements normal.     Skin: He is not diaphoretic.  Nursing note and vitals reviewed.    ED Treatments / Results  Labs (all labs ordered are listed, but only abnormal results are displayed) Labs Reviewed  BASIC METABOLIC PANEL - Abnormal; Notable for the following:       Result Value   Potassium 3.4 (*)    Glucose, Bld 111 (*)    All other components within normal limits  CBG MONITORING, ED -  Abnormal; Notable for the following:    Glucose-Capillary 108 (*)    All other components within normal limits  CBC    EKG  EKG Interpretation  Date/Time:  Tuesday November 11 2016 23:21:38 EST Ventricular Rate:  52 PR Interval:    QRS Duration: 93 QT Interval:  384 QTC Calculation: 357 R Axis:   72 Text Interpretation:  Sinus rhythm Early repolarization No significant change since last tracing Confirmed by Bebe Shaggy  MD, DONALD ([id number]) on 11/11/2016 11:46:16 PM       Radiology No results found.  Procedures Procedures (including critical care time)  Medications Ordered  in ED Medications  sodium chloride 0.9 % bolus 1,000 mL (0 mLs Intravenous Stopped 11/12/16 0622)  ketorolac (TORADOL) 30 MG/ML injection 30 mg (30 mg Intravenous Given 11/12/16 0521)  prochlorperazine (COMPAZINE) injection 10 mg (10 mg Intravenous Given 11/12/16 0521)  diphenhydrAMINE (BENADRYL) injection 25 mg (25 mg Intravenous Given 11/12/16 0521)     Initial Impression / Assessment and Plan / ED Course  I have reviewed the triage vital signs and the nursing notes.  Pertinent labs & imaging results that were available during my care of the patient were reviewed by me and considered in my medical decision making (see chart for details).  Clinical Course as of Nov 13 727  Wed Nov 12, 2016  0609 Headache completely resolved.   [EW]    Clinical Course User Index [EW] Trixie Dredge, PA-C    Afebrile, nontoxic patient with typical migraine  headache.  No red flags including head trauma, fevers, meningeal signs, focal neurologic deficits.  Migraine cocktail  given with relief.    D/C home with PCP follow up.  Discussed result, findings, treatment, and follow up  with patient.  Pt given return precautions.  Pt verbalizes understanding and agrees with plan.     Final Clinical Impressions(s) / ED Diagnoses   Final diagnoses:  Bad headache    New Prescriptions Discharge Medication List as of 11/12/2016  6:12 AM      START taking these medications   Details  SUMAtriptan (IMITREX) 25 MG tablet Take 1 tablet (25 mg total) by mouth once as needed for migraine (may repeat once after two hours if needed). May repeat in 2 hours if headache persists or recurs., Starting Wed 11/12/2016, Print         [place], [place] 11/12/16 [id number]    Zadie Rhine, MD 11/12/16 [phone]

## 2016-11-12 NOTE — Discharge Instructions (Signed)
Read the information below.  Use the prescribed medication as directed.  Please discuss all new medications with your pharmacist.  You may return to the Emergency Department at any time for worsening condition or any new symptoms that concern you.   ° °You are having a headache. No specific cause was found today for your headache. It may have been a migraine or other cause of headache. Stress, anxiety, fatigue, and depression are common triggers for headaches. Your headache today does not appear to be life-threatening or require hospitalization, but often the exact cause of headaches is not determined in the emergency department. Therefore, follow-up with your doctor is very important to find out what may have caused your headache, and whether or not you need any further diagnostic testing or treatment. Sometimes headaches can appear benign (not harmful), but then more serious symptoms can develop which should prompt an immediate re-evaluation by your doctor or the emergency department. °SEEK MEDICAL ATTENTION IF: °You develop possible problems with medications prescribed.  °The medications don't resolve your headache, if it recurs , or if you have multiple episodes of vomiting or can't take fluids. °You have a change from the usual headache. °RETURN IMMEDIATELY IF you develop a sudden, severe headache or confusion, become poorly responsive or faint, develop a fever above 100.4F or problem breathing, have a change in speech, vision, swallowing, or understanding, or develop new weakness, numbness, tingling, incoordination, or have a seizure. °

## 2016-11-25 ENCOUNTER — Encounter (HOSPITAL_COMMUNITY): Payer: Self-pay | Admitting: Emergency Medicine

## 2016-11-25 ENCOUNTER — Emergency Department (HOSPITAL_COMMUNITY): Payer: Managed Care, Other (non HMO)

## 2016-11-25 ENCOUNTER — Emergency Department (HOSPITAL_COMMUNITY)
Admission: EM | Admit: 2016-11-25 | Discharge: 2016-11-25 | Disposition: A | Discharge status: Home / Self Care | Payer: Managed Care, Other (non HMO) | Attending: Emergency Medicine | Admitting: Emergency Medicine

## 2016-11-25 DIAGNOSIS — T07XXXA Unspecified multiple injuries, initial encounter: Secondary | ICD-10-CM

## 2016-11-25 DIAGNOSIS — Y999 Unspecified external cause status: Secondary | ICD-10-CM | POA: Insufficient documentation

## 2016-11-25 DIAGNOSIS — Z79899 Other long term (current) drug therapy: Secondary | ICD-10-CM | POA: Insufficient documentation

## 2016-11-25 DIAGNOSIS — Z7982 Long term (current) use of aspirin: Secondary | ICD-10-CM | POA: Diagnosis not present

## 2016-11-25 DIAGNOSIS — Y92410 Unspecified street and highway as the place of occurrence of the external cause: Secondary | ICD-10-CM | POA: Insufficient documentation

## 2016-11-25 DIAGNOSIS — R109 Unspecified abdominal pain: Secondary | ICD-10-CM | POA: Insufficient documentation

## 2016-11-25 DIAGNOSIS — S4992XA Unspecified injury of left shoulder and upper arm, initial encounter: Secondary | ICD-10-CM | POA: Diagnosis present

## 2016-11-25 DIAGNOSIS — Y939 Activity, unspecified: Secondary | ICD-10-CM | POA: Diagnosis not present

## 2016-11-25 DIAGNOSIS — R930 Abnormal findings on diagnostic imaging of skull and head, not elsewhere classified: Secondary | ICD-10-CM | POA: Insufficient documentation

## 2016-11-25 DIAGNOSIS — Z23 Encounter for immunization: Secondary | ICD-10-CM | POA: Insufficient documentation

## 2016-11-25 DIAGNOSIS — S40812A Abrasion of left upper arm, initial encounter: Secondary | ICD-10-CM | POA: Diagnosis not present

## 2016-11-25 LAB — CBC WITH DIFFERENTIAL/PLATELET
BASOS ABS: 0 10*3/uL (ref 0.0–0.1)
Basophils Relative: 0 %
Eosinophils Absolute: 0.1 10*3/uL (ref 0.0–0.7)
Eosinophils Relative: 3 %
HEMATOCRIT: 40 % (ref 39.0–52.0)
HEMOGLOBIN: 13.9 g/dL (ref 13.0–17.0)
LYMPHS PCT: 31 %
Lymphs Abs: 1.6 10*3/uL (ref 0.7–4.0)
MCH: 28.5 pg (ref 26.0–34.0)
MCHC: 34.8 g/dL (ref 30.0–36.0)
MCV: 82.1 fL (ref 78.0–100.0)
MONO ABS: 0.5 10*3/uL (ref 0.1–1.0)
MONOS PCT: 10 %
NEUTROS ABS: 2.8 10*3/uL (ref 1.7–7.7)
NEUTROS PCT: 56 %
Platelets: 271 10*3/uL (ref 150–400)
RBC: 4.87 MIL/uL (ref 4.22–5.81)
RDW: 13.5 % (ref 11.5–15.5)
WBC: 4.9 10*3/uL (ref 4.0–10.5)

## 2016-11-25 LAB — I-STAT CHEM 8, ED
BUN: 12 mg/dL (ref 6–20)
CHLORIDE: 102 mmol/L (ref 101–111)
Calcium, Ion: 1.19 mmol/L (ref 1.15–1.40)
Creatinine, Ser: 0.9 mg/dL (ref 0.61–1.24)
GLUCOSE: 108 mg/dL — AB (ref 65–99)
HCT: 44 % (ref 39.0–52.0)
Hemoglobin: 15 g/dL (ref 13.0–17.0)
POTASSIUM: 3.9 mmol/L (ref 3.5–5.1)
SODIUM: 139 mmol/L (ref 135–145)
TCO2: 29 mmol/L (ref 0–100)

## 2016-11-25 MED ORDER — SODIUM CHLORIDE 0.9 % IJ SOLN
INTRAMUSCULAR | Status: AC
  Filled 2016-11-25: qty 50

## 2016-11-25 MED ORDER — NAPROXEN 500 MG PO TABS: 500.0000 mg | ORAL_TABLET | Freq: Two times a day (BID) | ORAL | 0 refills | Status: DC

## 2016-11-25 MED ORDER — IOPAMIDOL (ISOVUE-300) INJECTION 61%
100.0000 mL | Freq: Once | INTRAVENOUS | Status: AC | PRN
  Administered 2016-11-25: 100 mL via INTRAVENOUS

## 2016-11-25 MED ORDER — IOPAMIDOL (ISOVUE-300) INJECTION 61%
INTRAVENOUS | Status: AC
  Filled 2016-11-25: qty 100

## 2016-11-25 MED ORDER — IBUPROFEN 800 MG PO TABS: 800.0000 mg | ORAL_TABLET | Freq: Four times a day (QID) | ORAL | 0 refills | Status: AC

## 2016-11-25 MED ORDER — TETANUS-DIPHTH-ACELL PERTUSSIS 5-2.5-18.5 LF-MCG/0.5 IM SUSP
0.5000 mL | Freq: Once | INTRAMUSCULAR | Status: AC
  Administered 2016-11-25: 0.5 mL via INTRAMUSCULAR
  Filled 2016-11-25: qty 0.5

## 2016-11-25 MED ORDER — IBUPROFEN 800 MG PO TABS: 800.0000 mg | ORAL_TABLET | Freq: Four times a day (QID) | ORAL | 0 refills | Status: DC

## 2016-11-25 MED ORDER — NAPROXEN 500 MG PO TABS: 500.0000 mg | ORAL_TABLET | Freq: Two times a day (BID) | ORAL | 0 refills | Status: AC

## 2016-11-25 MED ORDER — KETOROLAC TROMETHAMINE 60 MG/2ML IM SOLN
60.0000 mg | Freq: Once | INTRAMUSCULAR | Status: AC
  Administered 2016-11-25: 60 mg via INTRAMUSCULAR
  Filled 2016-11-25: qty 2
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
## 2016-11-25 NOTE — ED Notes (Signed)
PA instructed this RN to collect blood and do EKG and place patient back in lobby.

## 2016-11-25 NOTE — ED Notes (Signed)
Pt ambulating around the room actively moving extremities.
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
## 2016-11-25 NOTE — Discharge Instructions (Signed)
Take either naproxen or ibuprofen but not both at the same time because of possibility of kidney injury or gastrointestinal upset from taking both.
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
## 2017-01-09 ENCOUNTER — Encounter (HOSPITAL_COMMUNITY): Payer: Self-pay | Admitting: Emergency Medicine

## 2017-01-09 ENCOUNTER — Emergency Department (HOSPITAL_COMMUNITY)
Admission: EM | Admit: 2017-01-09 | Discharge: 2017-01-09 | Disposition: A | Discharge status: Home / Self Care | Payer: Managed Care, Other (non HMO) | Attending: Emergency Medicine | Admitting: Emergency Medicine

## 2017-01-09 DIAGNOSIS — F0781 Postconcussional syndrome: Secondary | ICD-10-CM

## 2017-01-09 DIAGNOSIS — R109 Unspecified abdominal pain: Secondary | ICD-10-CM | POA: Insufficient documentation

## 2017-01-09 DIAGNOSIS — R197 Diarrhea, unspecified: Secondary | ICD-10-CM

## 2017-01-09 DIAGNOSIS — E860 Dehydration: Secondary | ICD-10-CM | POA: Insufficient documentation

## 2017-01-09 DIAGNOSIS — R51 Headache: Secondary | ICD-10-CM | POA: Insufficient documentation

## 2017-01-09 DIAGNOSIS — Z79899 Other long term (current) drug therapy: Secondary | ICD-10-CM | POA: Insufficient documentation

## 2017-01-09 DIAGNOSIS — Z7982 Long term (current) use of aspirin: Secondary | ICD-10-CM | POA: Insufficient documentation

## 2017-01-09 DIAGNOSIS — R112 Nausea with vomiting, unspecified: Secondary | ICD-10-CM

## 2017-01-09 LAB — URINALYSIS, ROUTINE W REFLEX MICROSCOPIC
Bilirubin Urine: NEGATIVE
Glucose, UA: NEGATIVE mg/dL
Ketones, ur: NEGATIVE mg/dL
LEUKOCYTES UA: NEGATIVE
Nitrite: NEGATIVE
Protein, ur: NEGATIVE mg/dL
SPECIFIC GRAVITY, URINE: 1.008 (ref 1.005–1.030)
SQUAMOUS EPITHELIAL / LPF: NONE SEEN
pH: 6 (ref 5.0–8.0)

## 2017-01-09 LAB — COMPREHENSIVE METABOLIC PANEL
ALBUMIN: 4.5 g/dL (ref 3.5–5.0)
ALT: 32 U/L (ref 17–63)
AST: 42 U/L — AB (ref 15–41)
Alkaline Phosphatase: 45 U/L (ref 38–126)
Anion gap: 10 (ref 5–15)
BUN: 9 mg/dL (ref 6–20)
CHLORIDE: 104 mmol/L (ref 101–111)
CO2: 23 mmol/L (ref 22–32)
CREATININE: 1.08 mg/dL (ref 0.61–1.24)
Calcium: 9.1 mg/dL (ref 8.9–10.3)
GFR calc Af Amer: >60 mL/min (ref >60)
Glucose, Bld: 112 mg/dL — ABNORMAL HIGH (ref 65–99)
POTASSIUM: 3.2 mmol/L — AB (ref 3.5–5.1)
SODIUM: 137 mmol/L (ref 135–145)
Total Bilirubin: 1.4 mg/dL — ABNORMAL HIGH (ref 0.3–1.2)
Total Protein: 7.9 g/dL (ref 6.5–8.1)

## 2017-01-09 LAB — CBC
HEMATOCRIT: 41.1 % (ref 39.0–52.0)
Hemoglobin: 14.7 g/dL (ref 13.0–17.0)
MCH: 28.7 pg (ref 26.0–34.0)
MCHC: 35.8 g/dL (ref 30.0–36.0)
MCV: 80.1 fL (ref 78.0–100.0)
Platelets: 175 10*3/uL (ref 150–400)
RBC: 5.13 MIL/uL (ref 4.22–5.81)
RDW: 12.7 % (ref 11.5–15.5)
WBC: 9.8 10*3/uL (ref 4.0–10.5)

## 2017-01-09 LAB — LIPASE, BLOOD: LIPASE: 26 U/L (ref 11–51)

## 2017-01-09 MED ORDER — MECLIZINE HCL 12.5 MG PO TABS: 12.5000 mg | ORAL_TABLET | Freq: Three times a day (TID) | ORAL | 0 refills | Status: DC | PRN

## 2017-01-09 MED ORDER — DIPHENHYDRAMINE HCL 50 MG/ML IJ SOLN
25.0000 mg | Freq: Once | INTRAMUSCULAR | Status: AC
  Administered 2017-01-09: 25 mg via INTRAVENOUS
  Filled 2017-01-09: qty 1

## 2017-01-09 MED ORDER — PROCHLORPERAZINE MALEATE 10 MG PO TABS: 10.0000 mg | ORAL_TABLET | Freq: Three times a day (TID) | ORAL | 0 refills | Status: DC | PRN

## 2017-01-09 MED ORDER — PROCHLORPERAZINE EDISYLATE 5 MG/ML IJ SOLN
10.0000 mg | Freq: Once | INTRAMUSCULAR | Status: AC
  Administered 2017-01-09: 10 mg via INTRAVENOUS
  Filled 2017-01-09: qty 2

## 2017-01-09 MED ORDER — POTASSIUM CHLORIDE CRYS ER 20 MEQ PO TBCR
40.0000 meq | EXTENDED_RELEASE_TABLET | Freq: Once | ORAL | Status: AC
  Administered 2017-01-09: 40 meq via ORAL
  Filled 2017-01-09: qty 2

## 2017-01-09 MED ORDER — MECLIZINE HCL 25 MG PO TABS: 25.0000 mg | ORAL_TABLET | Freq: Two times a day (BID) | ORAL | 0 refills | Status: DC | PRN

## 2017-01-09 MED ORDER — SODIUM CHLORIDE 0.9 % IV BOLUS (SEPSIS)
1000.0000 mL | Freq: Once | INTRAVENOUS | Status: AC
  Administered 2017-01-09: 1000 mL via INTRAVENOUS

## 2017-01-09 MED ORDER — KETOROLAC TROMETHAMINE 15 MG/ML IJ SOLN
15.0000 mg | Freq: Once | INTRAMUSCULAR | Status: AC
  Administered 2017-01-09: 15 mg via INTRAVENOUS
  Filled 2017-01-09: qty 1
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
## 2017-01-09 NOTE — ED Notes (Signed)
Patient is alert and oriented x3.  He was given DC instructions and follow up visit instructions.  Patient gave verbal understanding.  He was DC ambulatory under his own power to home.  V/S stable.  He was not showing any signs of distress on DC 

## 2017-01-09 NOTE — ED Triage Notes (Signed)
Patient is complaining of nausea, vomiting, and diarrhea. Patient states it started Wednesday. 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

## 2017-02-03 ENCOUNTER — Emergency Department (HOSPITAL_COMMUNITY): Payer: Self-pay

## 2017-02-03 ENCOUNTER — Encounter (HOSPITAL_COMMUNITY): Payer: Self-pay | Admitting: Emergency Medicine

## 2017-02-03 ENCOUNTER — Emergency Department (HOSPITAL_COMMUNITY)
Admission: EM | Admit: 2017-02-03 | Discharge: 2017-02-03 | Disposition: A | Discharge status: Home / Self Care | Payer: Self-pay | Attending: Emergency Medicine | Admitting: Emergency Medicine

## 2017-02-03 DIAGNOSIS — Z7982 Long term (current) use of aspirin: Secondary | ICD-10-CM | POA: Insufficient documentation

## 2017-02-03 DIAGNOSIS — G43909 Migraine, unspecified, not intractable, without status migrainosus: Secondary | ICD-10-CM | POA: Insufficient documentation

## 2017-02-03 DIAGNOSIS — K30 Functional dyspepsia: Secondary | ICD-10-CM | POA: Insufficient documentation

## 2017-02-03 DIAGNOSIS — Z79899 Other long term (current) drug therapy: Secondary | ICD-10-CM | POA: Insufficient documentation

## 2017-02-03 LAB — CBC
HCT: 40.2 % (ref 39.0–52.0)
HEMOGLOBIN: 13.9 g/dL (ref 13.0–17.0)
MCH: 28.7 pg (ref 26.0–34.0)
MCHC: 34.6 g/dL (ref 30.0–36.0)
MCV: 83.1 fL (ref 78.0–100.0)
Platelets: 238 10*3/uL (ref 150–400)
RBC: 4.84 MIL/uL (ref 4.22–5.81)
RDW: 13 % (ref 11.5–15.5)
WBC: 6.2 10*3/uL (ref 4.0–10.5)

## 2017-02-03 LAB — I-STAT TROPONIN, ED: TROPONIN I, POC: 0 ng/mL (ref 0.00–0.08)

## 2017-02-03 LAB — BASIC METABOLIC PANEL
ANION GAP: 5 (ref 5–15)
BUN: 5 mg/dL — AB (ref 6–20)
CALCIUM: 9.1 mg/dL (ref 8.9–10.3)
CO2: 27 mmol/L (ref 22–32)
CREATININE: 0.88 mg/dL (ref 0.61–1.24)
Chloride: 106 mmol/L (ref 101–111)
GFR calc Af Amer: >60 mL/min (ref >60)
GLUCOSE: 103 mg/dL — AB (ref 65–99)
Potassium: 3.4 mmol/L — ABNORMAL LOW (ref 3.5–5.1)
Sodium: 138 mmol/L (ref 135–145)

## 2017-02-03 MED ORDER — KETOROLAC TROMETHAMINE 30 MG/ML IJ SOLN: 30.0000 mg | Freq: Once | INTRAMUSCULAR | Status: DC

## 2017-02-03 MED ORDER — KETOROLAC TROMETHAMINE 30 MG/ML IJ SOLN
30.0000 mg | Freq: Once | INTRAMUSCULAR | Status: AC
  Administered 2017-02-03: 30 mg via INTRAVENOUS
  Filled 2017-02-03: qty 1

## 2017-02-03 NOTE — Discharge Instructions (Signed)
Please read attached information. If you experience any new or worsening signs or symptoms please return to the emergency room for evaluation. Please follow-up with your primary care provider or specialist as discussed.  °

## 2017-02-03 NOTE — ED Notes (Signed)
Remove iv per dis charge

## 2017-02-03 NOTE — ED Triage Notes (Signed)
Per EMS, brought pt in for centralized non-radiating chest pains and "abnormal headache"  Pt reports that he has both for over three months but feels he needs it checked out today.  Reports he has been doing "heavy drinking for a few days" as well.  Was given 324 ASA but when they tried to give nitro he stated his chest pain was gone.
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
## 2017-02-16 ENCOUNTER — Emergency Department (HOSPITAL_COMMUNITY): Payer: Managed Care, Other (non HMO)

## 2017-02-16 ENCOUNTER — Encounter (HOSPITAL_COMMUNITY): Payer: Self-pay | Admitting: Emergency Medicine

## 2017-02-16 DIAGNOSIS — R197 Diarrhea, unspecified: Secondary | ICD-10-CM | POA: Insufficient documentation

## 2017-02-16 DIAGNOSIS — R1110 Vomiting, unspecified: Secondary | ICD-10-CM | POA: Insufficient documentation

## 2017-02-16 DIAGNOSIS — R51 Headache: Secondary | ICD-10-CM | POA: Insufficient documentation

## 2017-02-16 LAB — URINALYSIS, ROUTINE W REFLEX MICROSCOPIC
Bilirubin Urine: NEGATIVE
GLUCOSE, UA: NEGATIVE mg/dL
Hgb urine dipstick: NEGATIVE
Ketones, ur: NEGATIVE mg/dL
LEUKOCYTES UA: NEGATIVE
Nitrite: NEGATIVE
PROTEIN: NEGATIVE mg/dL
Specific Gravity, Urine: 1.003 — ABNORMAL LOW (ref 1.005–1.030)
pH: 6 (ref 5.0–8.0)

## 2017-02-16 LAB — BASIC METABOLIC PANEL
Anion gap: 7 (ref 5–15)
BUN: 9 mg/dL (ref 6–20)
CHLORIDE: 103 mmol/L (ref 101–111)
CO2: 29 mmol/L (ref 22–32)
Calcium: 9.5 mg/dL (ref 8.9–10.3)
Creatinine, Ser: 1 mg/dL (ref 0.61–1.24)
GFR calc Af Amer: >60 mL/min (ref >60)
GFR calc non Af Amer: >60 mL/min (ref >60)
GLUCOSE: 96 mg/dL (ref 65–99)
POTASSIUM: 3.9 mmol/L (ref 3.5–5.1)
Sodium: 139 mmol/L (ref 135–145)

## 2017-02-16 LAB — CBC
HEMATOCRIT: 42.7 % (ref 39.0–52.0)
Hemoglobin: 15 g/dL (ref 13.0–17.0)
MCH: 29.1 pg (ref 26.0–34.0)
MCHC: 35.1 g/dL (ref 30.0–36.0)
MCV: 82.8 fL (ref 78.0–100.0)
Platelets: 261 10*3/uL (ref 150–400)
RBC: 5.16 MIL/uL (ref 4.22–5.81)
RDW: 13.7 % (ref 11.5–15.5)
WBC: 5.8 10*3/uL (ref 4.0–10.5)

## 2017-02-16 LAB — I-STAT TROPONIN, ED: Troponin i, poc: 0 ng/mL (ref 0.00–0.08)

## 2017-02-16 LAB — LIPASE, BLOOD: LIPASE: 20 U/L (ref 11–51)

## 2017-02-16 NOTE — ED Triage Notes (Addendum)
Patient c/o LLQ abdominal pain, blood in emesis and diarrhea x48 hours. Patient also reports constant sharp left sided chest pains x3 days.  Reports "heavy drinking" in the past 72 hours. Patient also reports burning with urination and requesting STD check. Denies discharge, SOB. Ambulatory to triage.

## 2017-02-17 ENCOUNTER — Emergency Department (HOSPITAL_COMMUNITY)
Admission: EM | Admit: 2017-02-17 | Discharge: 2017-02-17 | Disposition: A | Discharge status: Home / Self Care | Payer: Managed Care, Other (non HMO) | Attending: Emergency Medicine | Admitting: Emergency Medicine

## 2017-02-17 DIAGNOSIS — R1110 Vomiting, unspecified: Secondary | ICD-10-CM

## 2017-02-17 DIAGNOSIS — R51 Headache: Secondary | ICD-10-CM

## 2017-02-17 DIAGNOSIS — R197 Diarrhea, unspecified: Secondary | ICD-10-CM

## 2017-02-17 DIAGNOSIS — R519 Headache, unspecified: Secondary | ICD-10-CM

## 2017-02-17 LAB — HEPATIC FUNCTION PANEL
ALT: 27 U/L (ref 17–63)
AST: 34 U/L (ref 15–41)
Albumin: 4.2 g/dL (ref 3.5–5.0)
Alkaline Phosphatase: 37 U/L — ABNORMAL LOW (ref 38–126)
BILIRUBIN INDIRECT: 1 mg/dL — AB (ref 0.3–0.9)
BILIRUBIN TOTAL: 1.2 mg/dL (ref 0.3–1.2)
Bilirubin, Direct: 0.2 mg/dL (ref 0.1–0.5)
Total Protein: 7.6 g/dL (ref 6.5–8.1)

## 2017-02-17 MED ORDER — OMEPRAZOLE 20 MG PO CPDR: 20.0000 mg | DELAYED_RELEASE_CAPSULE | Freq: Every day | ORAL | 0 refills | Status: DC

## 2017-02-17 MED ORDER — GI COCKTAIL ~~LOC~~
30.0000 mL | Freq: Once | ORAL | Status: AC
  Administered 2017-02-17: 30 mL via ORAL
  Filled 2017-02-17: qty 30

## 2017-02-17 MED ORDER — KETOROLAC TROMETHAMINE 60 MG/2ML IM SOLN
60.0000 mg | Freq: Once | INTRAMUSCULAR | Status: AC
  Administered 2017-02-17: 60 mg via INTRAMUSCULAR
  Filled 2017-02-17: qty 2

## 2017-02-17 NOTE — Discharge Instructions (Signed)
Avoid use of Goody Powders, ibuprofen, Motrin, and Aleve. Also, avoid alcohol use. Take omeprazole daily as prescribed. Follow up with your neurologist.
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
## 2017-04-08 ENCOUNTER — Emergency Department (HOSPITAL_COMMUNITY): Payer: Self-pay

## 2017-04-08 ENCOUNTER — Emergency Department (HOSPITAL_COMMUNITY)
Admission: EM | Admit: 2017-04-08 | Discharge: 2017-04-08 | Disposition: A | Discharge status: Home / Self Care | Payer: Self-pay | Attending: Emergency Medicine | Admitting: Emergency Medicine

## 2017-04-08 ENCOUNTER — Encounter (HOSPITAL_COMMUNITY): Payer: Self-pay | Admitting: Emergency Medicine

## 2017-04-08 DIAGNOSIS — R0789 Other chest pain: Secondary | ICD-10-CM

## 2017-04-08 DIAGNOSIS — Z79899 Other long term (current) drug therapy: Secondary | ICD-10-CM | POA: Insufficient documentation

## 2017-04-08 DIAGNOSIS — F419 Anxiety disorder, unspecified: Secondary | ICD-10-CM | POA: Insufficient documentation

## 2017-04-08 DIAGNOSIS — R079 Chest pain, unspecified: Secondary | ICD-10-CM | POA: Insufficient documentation

## 2017-04-08 LAB — CBC
HCT: 38 % — ABNORMAL LOW (ref 39.0–52.0)
Hemoglobin: 13.2 g/dL (ref 13.0–17.0)
MCH: 28 pg (ref 26.0–34.0)
MCHC: 34.7 g/dL (ref 30.0–36.0)
MCV: 80.7 fL (ref 78.0–100.0)
PLATELETS: 235 10*3/uL (ref 150–400)
RBC: 4.71 MIL/uL (ref 4.22–5.81)
RDW: 13.3 % (ref 11.5–15.5)
WBC: 4.7 10*3/uL (ref 4.0–10.5)

## 2017-04-08 LAB — BASIC METABOLIC PANEL
Anion gap: 8 (ref 5–15)
BUN: 8 mg/dL (ref 6–20)
CALCIUM: 9.3 mg/dL (ref 8.9–10.3)
CHLORIDE: 104 mmol/L (ref 101–111)
CO2: 27 mmol/L (ref 22–32)
CREATININE: 1 mg/dL (ref 0.61–1.24)
GFR calc Af Amer: >60 mL/min (ref >60)
GFR calc non Af Amer: >60 mL/min (ref >60)
GLUCOSE: 98 mg/dL (ref 65–99)
Potassium: 3.4 mmol/L — ABNORMAL LOW (ref 3.5–5.1)
Sodium: 139 mmol/L (ref 135–145)

## 2017-04-08 LAB — I-STAT TROPONIN, ED: TROPONIN I, POC: 0 ng/mL (ref 0.00–0.08)

## 2017-04-08 NOTE — ED Notes (Signed)
Bed: [id number] Expected date:  Expected time:  Means of arrival:  Comments: EMS 26 yo anxiety-out of xanax

## 2017-04-08 NOTE — ED Provider Notes (Signed)
[facility]-EMERGENCY DEPT Provider Note   CSN: [id number] Arrival date & time: 04/08/17  8469     History   Chief Complaint Chief Complaint  Patient presents with  . Chest Pain    HPI Larry Estes is a 26 y.o. male arrived via EMS with a h/o of anxiety who presents to the emergency department with midsternal chest pain that began at approximately 7 PM last night with radiation down his left arm. He reports that the chest pain felt "crushing" and he described the radiation as "wave-like" with numbness and weakness in his left hand that began intermittently and became constant at approximately 11 PM last night. He reports he associated diffuse abdominal pain, mild, intermittent nausea, and HA. He denies dyspnea, diaphoresis, cough, palpitations, syncope, dizziness, lightheadedness, vomiting, or diarrhea. He denies recent trauma or injury to the left shoulder or neck.  Past medical history includes anxiety on Xanax, which he reports that he recently ran out of and is not able to refill until tomorrow. No other pertinent past medical history. He reports that he currently has a primary care provider, but she is in the process of relocating to another city. He reports he is concerned about continuity of care.  He denies alcohol, illicit drug use including cocaine, or tobacco use. He reports he had an ECHO of his heart completed last year, which was normal. He has a family history of diabetes, hypertension. He reports both of his grandmothers had an MI in their early 55s.   The history is provided by the patient. No language interpreter was used.    Past Medical History:  Diagnosis Date  . Allergic angiitis (HCC)   . Allergy   . Anxiety   . Headache 12/26/2015  . Multiple allergies   . Sickle cell trait [facility])     Patient Active Problem List   Diagnosis Date Noted  . Headache 12/26/2015  . Myalgia 12/11/2015    History reviewed. No pertinent surgical history.     Home Medications     Prior to Admission medications   Medication Sig Start Date End Date Taking? Authorizing Provider  ALPRAZolam Prudy Feeler) 1 MG tablet Take 1 mg by mouth 3 (three) times daily as needed for anxiety or sleep.    Yes [provider]  busPIRone (BUSPAR) 15 MG tablet Take 15 mg by mouth 3 (three) times daily.   Yes [provider]  cholecalciferol (VITAMIN D) 1000 units tablet Take 1,000 Units by mouth daily.   Yes [provider]  escitalopram (LEXAPRO) 20 MG tablet Take 20 mg by mouth daily.   Yes [provider]  ibuprofen (ADVIL,MOTRIN) 200 MG tablet Take 600 mg by mouth every 6 (six) hours as needed for fever, headache, mild pain or moderate pain.    Yes [provider]  meclizine (ANTIVERT) 25 MG tablet Take 1 tablet (25 mg total) by mouth 2 (two) times daily as needed for dizziness. 01/09/17  Yes Shaune Pollack, MD  Multiple Vitamin (MULTIVITAMIN WITH MINERALS) TABS tablet Take 1 tablet by mouth daily.   Yes [provider]  omeprazole (PRILOSEC) 20 MG capsule Take 1 capsule (20 mg total) by mouth daily. 02/17/17  Yes Antony Madura, PA-C  prochlorperazine (COMPAZINE) 10 MG tablet Take 1 tablet (10 mg total) by mouth every 8 (eight) hours as needed for nausea or vomiting. 01/09/17  Yes Shaune Pollack, MD  pyridOXINE (VITAMIN B-6) 100 MG tablet Take 200 mg by mouth daily.   Yes [provider]  SUMAtriptan (IMITREX) 25 MG tablet Take 1 tablet (25 mg total) by mouth once as needed for migraine (may repeat once after two hours if needed). May repeat in 2 hours if headache persists or recurs. 11/12/16  Yes West, Emily, PA-C  topiramate (TOPAMAX) 25 MG tablet Take one tablet at night for one week, then take 2 tablets at night for one week, then take 3 tablets at night. Patient taking differently: Take 25 mg by mouth at bedtime.  11/12/16  Yes West, Emily, PA-C  vitamin A (VITAMIN A) 10000 UNIT capsule Take 20,000 Units by mouth daily.   Yes [provider]  vitamin B-12 (CYANOCOBALAMIN) 1000 MCG tablet Take 1,000 mcg by mouth daily.   Yes [provider]  vitamin C (ASCORBIC ACID) 500 MG tablet Take 500 mg by mouth daily.   Yes [provider]  vitamin E 400 UNIT capsule Take 800 Units by mouth daily.    Yes [provider]    Family History Family History  Problem Relation Age of Onset  . Hypertension Mother   . Hypertension Maternal Grandmother   . Hypertension Paternal Grandmother   . CAD Other   . Hypertension Other   . Diabetes Other     Social History Social History  Substance Use Topics  . Smoking status: Never Smoker  . Smokeless tobacco: Never Used  . Alcohol use Yes     Comment: socially     Allergies   Fish allergy; Reglan [metoclopramide]; and Tramadol   Review of Systems Review of Systems  Constitutional: Negative for activity change, chills and fever.  Respiratory: Negative for cough and shortness of breath.   Cardiovascular: Positive for chest pain. Negative for palpitations and leg swelling.  Gastrointestinal: Positive for abdominal pain and nausea. Negative for diarrhea and vomiting.  Musculoskeletal: Negative for arthralgias, back pain and myalgias.  Skin: Negative for rash.  Allergic/Immunologic: Negative for immunocompromised state.  Neurological: Positive for weakness, numbness and headaches. Negative for dizziness, syncope and light-headedness.     Physical Exam Updated Vital Signs BP 113/67 (BP Location: Right Arm)   Pulse 60   Temp 98.3 F (36.8 C) (Oral)   Resp 18   SpO2 99%   Physical Exam  Constitutional: He is oriented to person, place, and time. He appears well-developed.  HENT:  Head: Normocephalic.  Right Ear: Tympanic membrane and external ear normal.  Left Ear: Tympanic membrane and external ear normal.  Mouth/Throat: Oropharynx is clear and moist.  Eyes: Conjunctivae are normal.  Neck: Normal range of motion. Neck supple. No JVD  present. No tracheal deviation present.  Cardiovascular: Normal rate, regular rhythm, normal heart sounds and intact distal pulses.  PMI is not displaced.  Exam reveals no gallop, no S3, no S4 and no friction rub.   No murmur heard. Pulses:      Radial pulses are 2+ on the right side, and 2+ on the left side.       Dorsalis pedis pulses are 2+ on the right side, and 2+ on the left side.       Posterior tibial pulses are 2+ on the right side, and 2+ on the left side.  Pulmonary/Chest: Effort normal and breath sounds normal. No respiratory distress. He has no wheezes. He has no rales. He exhibits no tenderness.  No reproducible chest tenderness to palpation.  Abdominal: Soft. Bowel sounds are normal. He exhibits no distension and no mass. There is no tenderness. There is no rebound and no  guarding. No hernia.  Musculoskeletal: Normal range of motion. He exhibits no edema, tenderness or deformity.  No tenderness to palpation over the bony landmarks of the left shoulder. Full active and passive range of motion. Negative empty can test. NVI.  Neurological: He is alert and oriented to person, place, and time. No sensory deficit.  5 out of 5 strength of the bilateral upper and lower extremities with grip strength. No tenderness to palpation over the bilateral shoulders. Full active and passive range of motion.  Skin: Skin is warm and dry. Capillary refill takes less than 2 seconds.  Psychiatric: His behavior is normal.  Nursing note and vitals reviewed.    ED Treatments / Results  Labs (all labs ordered are listed, but only abnormal results are displayed) Labs Reviewed  BASIC METABOLIC PANEL - Abnormal; Notable for the following:       Result Value   Potassium 3.4 (*)    All other components within normal limits  CBC - Abnormal; Notable for the following:    HCT 38.0 (*)    All other components within normal limits  I-STAT TROPOININ, ED    EKG  EKG Interpretation  Date/Time:  Wednesday  April 08 2017 03:44:34 EDT Ventricular Rate:  60 PR Interval:    QRS Duration: 95 QT Interval:  375 QTC Calculation: 375 R Axis:   65 Text Interpretation:  Sinus rhythm ST elev, probable normal early repol pattern No significant change was found Confirmed by Paula Libra ([id number]) on 04/08/2017 3:47:41 AM       Radiology Dg Chest 2 View  Result Date: 04/08/2017 CLINICAL DATA:  Chronic mid chest pain.  Initial encounter. EXAM: CHEST  2 VIEW COMPARISON:  Chest radiograph performed 02/16/2017 FINDINGS: The lungs are well-aerated and clear. There is no evidence of focal opacification, pleural effusion or pneumothorax. The heart is normal in size; the mediastinal contour is within normal limits. No acute osseous abnormalities are seen. IMPRESSION: No acute cardiopulmonary process seen. Electronically Signed   By: Roanna Raider M.D.   On: 04/08/2017 04:01    Procedures Procedures (including critical care time)  Medications Ordered in ED Medications - No data to display   Initial Impression / Assessment and Plan / ED Course  I have reviewed the triage vital signs and the nursing notes.  Pertinent labs & imaging results that were available during my care of the patient were reviewed by me and considered in my medical decision making (see chart for details).     Patient is to be discharged with recommendation to follow up with PCP in regards to today's hospital visit. Chest pain is not likely of cardiac or pulmonary etiology d/t presentation, PERC negative, VSS, no tracheal deviation, no JVD or new murmur, RRR, breath sounds equal bilaterally, EKG without acute abnormalities, negative troponin, and negative CXR. K 3.4, which appears to be the patient's baseline based on previous labs.    Symptoms are likely secondary to patient's history of anxiety since the patient has recently run out of benzodiazepines. Pt has been advised to return to the ED if CP becomes exertional, associated with  diaphoresis, or worsens or becomes concerning in any way. Pt appears reliable for follow up and is agreeable to discharge. Educated the patient on finding a new primary care provider. Provided the patient with a cardiology referral if symptoms persist. The patient also reports that he does not have a dentist; a referral was given for dentistry.   Final Clinical Impressions(s) / ED  Diagnoses   Final diagnoses:  Atypical chest pain  Anxiety    New Prescriptions Discharge Medication List as of 04/08/2017  7:11 AM       Frederik PearMcDonald, Tauni Sanks A, PA-C 04/08/17 0910    Molpus, Jonny RuizJohn, MD 04/08/17 [phone]

## 2017-04-08 NOTE — ED Triage Notes (Signed)
Patient is complaining of chest pain. Patient has been out of xanax for a week. Patient states he just ate and worked out.

## 2017-04-08 NOTE — Discharge Instructions (Signed)
You can call the number on her discharge paperwork to get established with a new primary care provider is moving. I provided a referral for dentistry and cardiology. There are many lifestyle changes she can make, including diet and exercise, that can help make anxiety more manageable. I have included some information along with her discharge paperwork. If he develop new or worsening symptoms, including new or worsening chest pain, fever, chills, please return to the emergency department for re-evaluation.

## 2017-04-27 ENCOUNTER — Emergency Department (HOSPITAL_COMMUNITY)
Admission: EM | Admit: 2017-04-27 | Discharge: 2017-04-27 | Disposition: A | Discharge status: Home / Self Care | Payer: Managed Care, Other (non HMO) | Attending: Emergency Medicine | Admitting: Emergency Medicine

## 2017-04-27 ENCOUNTER — Emergency Department (HOSPITAL_COMMUNITY): Payer: Managed Care, Other (non HMO)

## 2017-04-27 ENCOUNTER — Encounter (HOSPITAL_COMMUNITY): Payer: Self-pay | Admitting: Emergency Medicine

## 2017-04-27 ENCOUNTER — Other Ambulatory Visit: Payer: Self-pay

## 2017-04-27 DIAGNOSIS — Z79899 Other long term (current) drug therapy: Secondary | ICD-10-CM | POA: Insufficient documentation

## 2017-04-27 DIAGNOSIS — F419 Anxiety disorder, unspecified: Secondary | ICD-10-CM

## 2017-04-27 DIAGNOSIS — R519 Headache, unspecified: Secondary | ICD-10-CM

## 2017-04-27 DIAGNOSIS — R51 Headache: Secondary | ICD-10-CM | POA: Insufficient documentation

## 2017-04-27 DIAGNOSIS — M25562 Pain in left knee: Secondary | ICD-10-CM | POA: Insufficient documentation

## 2017-04-27 MED ORDER — ALPRAZOLAM 0.5 MG PO TABS
1.0000 mg | ORAL_TABLET | Freq: Once | ORAL | Status: AC
  Administered 2017-04-27: 1 mg via ORAL
  Filled 2017-04-27: qty 2

## 2017-04-27 MED ORDER — MELOXICAM 7.5 MG PO TABS: 7.5000 mg | ORAL_TABLET | Freq: Every day | ORAL | 0 refills | Status: DC

## 2017-04-27 MED ORDER — KETOROLAC TROMETHAMINE 30 MG/ML IJ SOLN
30.0000 mg | Freq: Once | INTRAMUSCULAR | Status: AC
  Administered 2017-04-27: 30 mg via INTRAMUSCULAR
  Filled 2017-04-27: qty 1

## 2017-04-27 MED ORDER — SUMATRIPTAN SUCCINATE 50 MG PO TABS: 50.0000 mg | ORAL_TABLET | ORAL | 0 refills | Status: DC | PRN

## 2017-04-27 NOTE — ED Provider Notes (Signed)
[facility]-EMERGENCY DEPT Provider Note   CSN: [id number] Arrival date & time: 04/27/17  1823     History   Chief Complaint Chief Complaint  Patient presents with  . Knee Injury    HPI Larry Estes is a 26 y.o. male presented with multiple complaints.  Patient reports left knee pain that began Saturday, when he thought something hit his knee. He was running at the time, but is unable to describe mechanism of injury. Per radiology noted, pt fell on anterior knee when flexed. Pt was able to walk afterwards, but has had increasing pain over the past couple days. He's been using Tylenol and ibuprofen without relief. Ice has improved his symptoms. He denies numbness or tingling of the leg or foot. Pain is located medially.  Pt reporting R sided temporal HA for which he has taken 2 goodie powders with minimal relief. He has had this headache for several months, and is being followed by neurology. He had an MRI today, and is waiting for results. He reports is ts associated with eye twitching. Pt is very anxious, and concerned he has a brain anueryism. Pt requesting refill of xanax, as he was on 1 mg daily with his pervious PCP, who retired recently. He reports increased worrying. Pt also reports intermittent feelings that his heart is beating abnromally. He states this happens when he feels anxious.  Additionally, pt would like information about dentists as he feels he needs a tooth pulled. He denies fevers, chills, rash, neck stiffness, CP, SOB, abd pain, nausea, vomiting, urinary sxs or abnormal BMs.   HPI  Past Medical History:  Diagnosis Date  . Allergic angiitis (HCC)   . Allergy   . Anxiety   . Headache 12/26/2015  . Multiple allergies   . Sickle cell trait [facility])     Patient Active Problem List   Diagnosis Date Noted  . Headache 12/26/2015  . Myalgia 12/11/2015    History reviewed. No pertinent surgical history.     Home Medications    Prior to Admission medications     Medication Sig Start Date End Date Taking? Authorizing Provider  acetaminophen (TYLENOL) 500 MG tablet Take 1,000 mg by mouth every 6 (six) hours as needed for moderate pain.   Yes [provider]  ALPRAZolam Prudy Feeler) 1 MG tablet Take 1 mg by mouth 3 (three) times daily as needed for anxiety or sleep.    Yes [provider]  busPIRone (BUSPAR) 15 MG tablet Take 15 mg by mouth 3 (three) times daily.   Yes [provider]  cholecalciferol (VITAMIN D) 1000 units tablet Take 1,000 Units by mouth daily.   Yes [provider]  escitalopram (LEXAPRO) 20 MG tablet Take 20 mg by mouth daily.   Yes [provider]  ibuprofen (ADVIL,MOTRIN) 200 MG tablet Take 600 mg by mouth every 6 (six) hours as needed for fever, headache, mild pain or moderate pain.    Yes [provider]  meclizine (ANTIVERT) 25 MG tablet Take 1 tablet (25 mg total) by mouth 2 (two) times daily as needed for dizziness. 01/09/17  Yes Shaune Pollack, MD  Multiple Vitamin (MULTIVITAMIN WITH MINERALS) TABS tablet Take 1 tablet by mouth daily.   Yes [provider]  omeprazole (PRILOSEC) 20 MG capsule Take 1 capsule (20 mg total) by mouth daily. 02/17/17  Yes Antony Madura, PA-C  prochlorperazine (COMPAZINE) 10 MG tablet Take 1 tablet (10 mg total) by mouth every 8 (eight) hours as needed for nausea or  vomiting. 01/09/17  Yes Shaune PollackIsaacs, Cameron, MD  pyridOXINE (VITAMIN B-6) 100 MG tablet Take 200 mg by mouth daily.   Yes [provider]  topiramate (TOPAMAX) 25 MG tablet Take one tablet at night for one week, then take 2 tablets at night for one week, then take 3 tablets at night. Patient taking differently: Take 25 mg by mouth at bedtime.  11/12/16  Yes West, Emily, PA-C  vitamin A (VITAMIN A) 10000 UNIT capsule Take 20,000 Units by mouth daily.   Yes [provider]  vitamin B-12 (CYANOCOBALAMIN) 1000 MCG tablet Take 1,000 mcg by mouth daily.   Yes [provider]   vitamin C (ASCORBIC ACID) 500 MG tablet Take 500 mg by mouth daily.   Yes [provider]  vitamin E 400 UNIT capsule Take 800 Units by mouth daily.    Yes [provider]  meloxicam (MOBIC) 7.5 MG tablet Take 1 tablet (7.5 mg total) by mouth daily. 04/27/17   Marlee Armenteros, PA-C  SUMAtriptan (IMITREX) 50 MG tablet Take 1 tablet (50 mg total) by mouth every 2 (two) hours as needed for migraine. May repeat in 2 hours if headache persists or recurs. 04/27/17   Michaline Kindig, PA-C    Family History Family History  Problem Relation Age of Onset  . Hypertension Mother   . Hypertension Maternal Grandmother   . Hypertension Paternal Grandmother   . CAD Other   . Hypertension Other   . Diabetes Other     Social History Social History  Substance Use Topics  . Smoking status: Never Smoker  . Smokeless tobacco: Never Used  . Alcohol use Yes     Comment: socially     Allergies   Fish allergy; Reglan [metoclopramide]; and Tramadol   Review of Systems Review of Systems  Constitutional: Negative for chills and fever.  HENT: Positive for dental problem. Negative for trouble swallowing.   Eyes: Negative for photophobia and visual disturbance.  Respiratory: Negative for cough and shortness of breath.   Cardiovascular: Negative for chest pain.  Gastrointestinal: Negative for abdominal pain.  Genitourinary: Negative for dysuria, frequency and hematuria.  Musculoskeletal: Positive for arthralgias.  Skin: Negative for rash.  Neurological: Positive for headaches.  Psychiatric/Behavioral: Positive for agitation.     Physical Exam Updated Vital Signs BP 117/63   Pulse 60   Temp 98.7 F (37.1 C) (Oral)   Resp 14   SpO2 99%   Physical Exam  Constitutional: He is oriented to person, place, and time. He appears well-developed and well-nourished.  HENT:  Head: Normocephalic and atraumatic.  Right Ear: Tympanic membrane, external ear and ear canal normal.  Left  Ear: Tympanic membrane, external ear and ear canal normal.  Nose: Nose normal.  Mouth/Throat: Uvula is midline, oropharynx is clear and moist and mucous membranes are normal.  No obvious dental abnormalities. No erythema or edema or gingiva. No facial swelling. No TTP of teeth.   Eyes: Pupils are equal, round, and reactive to light. Conjunctivae and EOM are normal.  Neck: Normal range of motion. Neck supple.  Cardiovascular: Normal rate, regular rhythm, normal heart sounds and intact distal pulses.   Pulmonary/Chest: Effort normal and breath sounds normal. No respiratory distress. He has no wheezes.  Abdominal: Soft. Bowel sounds are normal. He exhibits no distension. There is no tenderness.  Musculoskeletal: He exhibits tenderness.  No obvious contusions, and minimal swelling noted of L knee. TTP of medial aspect, and no TTP of lateral or posterior knee. Pt  with decreased ROM due to pain. Full ROM of ankle without pain. Sensation intact bilaterally. Color and warmth equal bilaterally. Pulses equal bilaterally. Compartments soft.   Lymphadenopathy:    He has no cervical adenopathy.  Neurological: He is alert and oriented to person, place, and time. He has normal strength. No cranial nerve deficit or sensory deficit. GCS eye subscore is 4. GCS verbal subscore is 5. GCS motor subscore is 6.  Skin: Skin is warm and dry.  Psychiatric: His mood appears anxious. He is agitated.  Nursing note and vitals reviewed.    ED Treatments / Results  Labs (all labs ordered are listed, but only abnormal results are displayed) Labs Reviewed - No data to display  EKG  EKG Interpretation None       Radiology Dg Knee Complete 4 Views Left  Result Date: 04/27/2017 CLINICAL DATA:  Pt fell while walking in "brush" outside and knee landed down on flexed patella on a tree stump x 2 days ago EXAM: LEFT KNEE - COMPLETE 4+ VIEW COMPARISON:  None. FINDINGS: No evidence of fracture, dislocation. Effusion in the  suprapatellar bursa. Normal alignment. No evidence of arthropathy or other focal bone abnormality. Soft tissues are unremarkable. IMPRESSION: 1. Knee effusion without fracture or other bone abnormality Electronically Signed   By: Corlis Leak M.D.   On: 04/27/2017 20:04    Procedures Procedures (including critical care time)  Medications Ordered in ED Medications  ALPRAZolam Prudy Feeler) tablet 1 mg (1 mg Oral Given 04/27/17 2119)  ketorolac (TORADOL) 30 MG/ML injection 30 mg (30 mg Intramuscular Given 04/27/17 2120)     Initial Impression / Assessment and Plan / ED Course  I have reviewed the triage vital signs and the nursing notes.  Pertinent labs & imaging results that were available during my care of the patient were reviewed by me and considered in my medical decision making (see chart for details).     Pt presenting today with multiple complaints. Knee pain: Pt with pain after something hit his knee on Saturday. Pt ambulatory with pain. Phsycial exam showed TTP of medial aspect of knee, with minimal swelling and no obvious contusion. Neurovascularly intact with soft compartments. Xray showed effusion. Will give pt rx for mobic, wrap knee, and give crutches. Pt to f/u with ortho.  Headache: Pt has has intermittent HAs for several months. MRI obtained today PTA. Today's HA feels the same as his normal HAs. Pt states he used to be on sumatriptan, but has not been able to get it since his PCP retired. Physical exam showed no neurologic deficit. I do not see any need for to have pt repeat scan done. Will give ketorolac for pain control. Pt reports HA improved with medication. Pt requested short course of triptans until he can f/u with neurology.  Anxiety: Pt with h/o anxiety, and no longer taking xanax. Pt asking for rx for xanax. discussed that I will not give rx, but I will give 1 dose in the ED. Throughout interview, pt acted very anxious, and continually questioned me about whether he was having  a stroke or a brain aneurysm. Anxiety improved with xanax. Pt reports occasional feelings of abnormal HR. Not currently experiencing this sx. EKG normal. Pt would like referral to cardiology. but told he needs to establish primary care, and then he can be referred to cardiology through his PCP. Per chart review, pt was given referral to cardiology and dentistry at his last vision on 07/04.  Pt given resources about  establishing care with PC and dentistry. Pt appears safe for discharge. Return precautions discussed. Pt states he understands and agrees to plan.    Final Clinical Impressions(s) / ED Diagnoses   Final diagnoses:  Acute pain of left knee  Chronic nonintractable headache, unspecified headache type  Anxiety    New Prescriptions Discharge Medication List as of 04/27/2017 10:35 PM    START taking these medications   Details  meloxicam (MOBIC) 7.5 MG tablet Take 1 tablet (7.5 mg total) by mouth daily., Starting Mon 04/27/2017, Print         [place], [place], PA-C 04/28/17 0346    Nira Conn, MD 04/28/17 [phone]

## 2017-04-27 NOTE — Discharge Instructions (Signed)
Use Mobic once daily for knee pain. Do not take other anti-inflammatories at the same time (ibuprofen, Aleve, naproxen, Motrin, Goody powders). You may supplement with Tylenol as needed. Try to rest your knee. Remain nonweightbearing until seen by orthopedics. Follow up with orthopedics in one week for further evaluation of your knee. Information about dentists and primary care is included in the paperwork. Return to the emergency department if you develop numbness, tingling, or any new or worsening symptoms.

## 2017-04-27 NOTE — ED Triage Notes (Signed)
Pt c/o left knee pain onset after [place] Saturday, felt something hit left knee.  Swelling, decreased ROM, stabbing sharp pain worse with palpation and weight-bearing. Initially pt continued to run on it but pain has worsened.

## 2017-05-28 ENCOUNTER — Encounter (HOSPITAL_COMMUNITY): Payer: Self-pay | Admitting: Emergency Medicine

## 2017-05-28 ENCOUNTER — Emergency Department (HOSPITAL_COMMUNITY)
Admission: EM | Admit: 2017-05-28 | Discharge: 2017-05-28 | Disposition: A | Discharge status: Home / Self Care | Payer: Managed Care, Other (non HMO) | Attending: Emergency Medicine | Admitting: Emergency Medicine

## 2017-05-28 ENCOUNTER — Emergency Department (HOSPITAL_COMMUNITY): Payer: Managed Care, Other (non HMO)

## 2017-05-28 DIAGNOSIS — F1210 Cannabis abuse, uncomplicated: Secondary | ICD-10-CM | POA: Insufficient documentation

## 2017-05-28 DIAGNOSIS — R0789 Other chest pain: Secondary | ICD-10-CM | POA: Insufficient documentation

## 2017-05-28 DIAGNOSIS — R519 Headache, unspecified: Secondary | ICD-10-CM

## 2017-05-28 DIAGNOSIS — D573 Sickle-cell trait: Secondary | ICD-10-CM | POA: Insufficient documentation

## 2017-05-28 DIAGNOSIS — Z79899 Other long term (current) drug therapy: Secondary | ICD-10-CM | POA: Insufficient documentation

## 2017-05-28 DIAGNOSIS — F419 Anxiety disorder, unspecified: Secondary | ICD-10-CM

## 2017-05-28 DIAGNOSIS — R51 Headache: Secondary | ICD-10-CM | POA: Insufficient documentation

## 2017-05-28 LAB — BASIC METABOLIC PANEL
ANION GAP: 10 (ref 5–15)
BUN: 11 mg/dL (ref 6–20)
CHLORIDE: 101 mmol/L (ref 101–111)
CO2: 27 mmol/L (ref 22–32)
Calcium: 9.4 mg/dL (ref 8.9–10.3)
Creatinine, Ser: 1.01 mg/dL (ref 0.61–1.24)
Glucose, Bld: 127 mg/dL — ABNORMAL HIGH (ref 65–99)
POTASSIUM: 3.5 mmol/L (ref 3.5–5.1)
SODIUM: 138 mmol/L (ref 135–145)

## 2017-05-28 LAB — CBC WITH DIFFERENTIAL/PLATELET
BASOS ABS: 0 10*3/uL (ref 0.0–0.1)
Basophils Relative: 0 %
EOS PCT: 1 %
Eosinophils Absolute: 0.1 10*3/uL (ref 0.0–0.7)
HCT: 40.9 % (ref 39.0–52.0)
HEMOGLOBIN: 14.6 g/dL (ref 13.0–17.0)
LYMPHS ABS: 1.2 10*3/uL (ref 0.7–4.0)
LYMPHS PCT: 17 %
MCH: 29.3 pg (ref 26.0–34.0)
MCHC: 35.7 g/dL (ref 30.0–36.0)
MCV: 82 fL (ref 78.0–100.0)
Monocytes Absolute: 0.5 10*3/uL (ref 0.1–1.0)
Monocytes Relative: 6 %
NEUTROS ABS: 5.5 10*3/uL (ref 1.7–7.7)
NEUTROS PCT: 76 %
PLATELETS: 247 10*3/uL (ref 150–400)
RBC: 4.99 MIL/uL (ref 4.22–5.81)
RDW: 13 % (ref 11.5–15.5)
WBC: 7.2 10*3/uL (ref 4.0–10.5)

## 2017-05-28 LAB — RAPID URINE DRUG SCREEN, HOSP PERFORMED
AMPHETAMINES: NOT DETECTED
BARBITURATES: NOT DETECTED
Benzodiazepines: NOT DETECTED
Cocaine: NOT DETECTED
Opiates: NOT DETECTED
TETRAHYDROCANNABINOL: POSITIVE — AB

## 2017-05-28 LAB — I-STAT TROPONIN, ED: Troponin i, poc: 0 ng/mL (ref 0.00–0.08)

## 2017-05-28 MED ORDER — ASPIRIN 81 MG PO CHEW
324.0000 mg | CHEWABLE_TABLET | Freq: Once | ORAL | Status: AC
  Administered 2017-05-28: 324 mg via ORAL
  Filled 2017-05-28: qty 4

## 2017-05-28 MED ORDER — SODIUM CHLORIDE 0.9 % IV BOLUS (SEPSIS)
1000.0000 mL | Freq: Once | INTRAVENOUS | Status: AC
  Administered 2017-05-28: 1000 mL via INTRAVENOUS

## 2017-05-28 MED ORDER — HYDROXYZINE HCL 25 MG PO TABS: 25.0000 mg | ORAL_TABLET | Freq: Four times a day (QID) | ORAL | 0 refills | Status: DC | PRN

## 2017-05-28 MED ORDER — ALPRAZOLAM 0.5 MG PO TABS
2.0000 mg | ORAL_TABLET | Freq: Once | ORAL | Status: AC
  Administered 2017-05-28: 2 mg via ORAL
  Filled 2017-05-28: qty 4

## 2017-05-28 MED ORDER — KETOROLAC TROMETHAMINE 30 MG/ML IJ SOLN
30.0000 mg | Freq: Once | INTRAMUSCULAR | Status: AC
  Administered 2017-05-28: 30 mg via INTRAVENOUS
  Filled 2017-05-28: qty 1

## 2017-05-28 NOTE — Discharge Instructions (Signed)
Please follow with your primary care doctor in the next 2 days for a check-up. They must obtain records for further management.  ° °Do not hesitate to return to the Emergency Department for any new, worsening or concerning symptoms.  ° °

## 2017-05-28 NOTE — Discharge Planning (Signed)
[facility] consulted regarding helping pt find PCP as pt PCP has retired.  Pt has insurance; [facility] advised to call customer service number on insurance card to get list of PCPs accepting her insurance in this area.

## 2017-05-28 NOTE — ED Provider Notes (Signed)
[facility]-EMERGENCY DEPT Provider Note   CSN: [id number] Arrival date & time: 05/28/17  7829     History   Chief Complaint Chief Complaint  Patient presents with  . Chest Pain  . Leg Pain     HPI   Blood pressure 115/72, pulse (!) 54, temperature 97.6 F (36.4 C), temperature source Oral, resp. rate 16, height 5\' 7"  (1.702 m), weight 93.9 kg (207 lb), SpO2 98 %.  Larry Estes is a 26 y.o. male with past medical history significant for sickle cell trait, anxiety left-sided chest pain onset at approximately 10 PM last night he states that the pain is severe, not exacerbated by movement and palpation, it radiates to the left arm and left leg. He states that he's had this multiple times in the in the past but never been this severe. 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 He normally takes Xanax 2 mg 3 times a day when necessary for anxiety however he has been out of this medication for 3 weeks because his doctor retired. He only takes the Xanax every few days. He hasn't had any significant cravings, jitteriness since he ran out 3 weeks ago. On review of systems he notes that generalized headache. He states that he had an MRI about approximately one month ago which was normal. He denies any cocaine or methamphetamine use, he smokes marijuana intermittently. He denies any calf pain or leg swelling, syncope, palpitations, hemoptysis. Family history of early cardiac death, he does not have any cardiac risk factors including diabetes, hypertension, hyperlipidemia, family history. He states that he's had multiple cardiac echoes which have all been normal.  Past Medical History:  Diagnosis Date  . Allergic angiitis (HCC)   . Allergy   . Anxiety   . Headache 12/26/2015  . Multiple allergies   . Sickle cell trait [facility])     Patient Active Problem List   Diagnosis Date Noted  . Headache 12/26/2015  . Myalgia 12/11/2015      History reviewed. No pertinent surgical history.     Home Medications    Prior to Admission medications   Medication Sig Start Date End Date Taking? Authorizing Provider  acetaminophen (TYLENOL) 500 MG tablet Take 1,000 mg by mouth every 6 (six) hours as needed for moderate pain.    [provider]  ALPRAZolam Prudy Feeler) 1 MG tablet Take 1 mg by mouth 3 (three) times daily as needed for anxiety or sleep.     [provider]  busPIRone (BUSPAR) 15 MG tablet Take 15 mg by mouth 3 (three) times daily.    [provider]  cholecalciferol (VITAMIN D) 1000 units tablet Take 1,000 Units by mouth daily.    [provider]  escitalopram (LEXAPRO) 20 MG tablet Take 20 mg by mouth daily.    [provider]  hydrOXYzine (ATARAX/VISTARIL) 25 MG tablet Take 1 tablet (25 mg total) by mouth every 6 (six) hours as needed for anxiety. 05/28/17   Polk Minor, Joni Reining, PA-C  ibuprofen (ADVIL,MOTRIN) 200 MG tablet Take 600 mg by mouth every 6 (six) hours as needed for fever, headache, mild pain or moderate pain.     [provider]  meclizine (ANTIVERT) 25 MG tablet Take 1 tablet (25 mg total) by mouth 2 (two) times daily as needed for dizziness. 01/09/17   Shaune Pollack, MD  meloxicam (MOBIC) 7.5 MG tablet Take 1 tablet (7.5 mg total)  by mouth daily. 04/27/17   Caccavale, Sophia, PA-C  Multiple Vitamin (MULTIVITAMIN WITH MINERALS) TABS tablet Take 1 tablet by mouth daily.    [provider]  omeprazole (PRILOSEC) 20 MG capsule Take 1 capsule (20 mg total) by mouth daily. 02/17/17   Antony Madura, PA-C  prochlorperazine (COMPAZINE) 10 MG tablet Take 1 tablet (10 mg total) by mouth every 8 (eight) hours as needed for nausea or vomiting. 01/09/17   Shaune Pollack, MD  pyridOXINE (VITAMIN B-6) 100 MG tablet Take 200 mg by mouth daily.    [provider]  SUMAtriptan (IMITREX) 50 MG tablet Take 1 tablet (50 mg total) by mouth every 2 (two) hours as  needed for migraine. May repeat in 2 hours if headache persists or recurs. 04/27/17   Caccavale, Sophia, PA-C  topiramate (TOPAMAX) 25 MG tablet Take one tablet at night for one week, then take 2 tablets at night for one week, then take 3 tablets at night. Patient taking differently: Take 25 mg by mouth at bedtime.  11/12/16   Trixie Dredge, PA-C  vitamin A (VITAMIN A) 10000 UNIT capsule Take 20,000 Units by mouth daily.    [provider]  vitamin B-12 (CYANOCOBALAMIN) 1000 MCG tablet Take 1,000 mcg by mouth daily.    [provider]  vitamin C (ASCORBIC ACID) 500 MG tablet Take 500 mg by mouth daily.    [provider]  vitamin E 400 UNIT capsule Take 800 Units by mouth daily.     [provider]    Family History Family History  Problem Relation Age of Onset  . Hypertension Mother   . Hypertension Maternal Grandmother   . Hypertension Paternal Grandmother   . CAD Other   . Hypertension Other   . Diabetes Other     Social History Social History  Substance Use Topics  . Smoking status: Never Smoker  . Smokeless tobacco: Never Used  . Alcohol use Yes     Comment: socially     Allergies   Fish allergy; Reglan [metoclopramide]; and Tramadol   Review of Systems Review of Systems   Physical Exam Updated Vital Signs BP (!) 106/59   Pulse (!) 53   Temp 97.6 F (36.4 C) (Oral)   Resp 15   Ht 5\' 7"  (1.702 m)   Wt 93.9 kg (207 lb)   SpO2 99%   BMI 32.42 kg/m   Physical Exam   ED Treatments / Results  Labs (all labs ordered are listed, but only abnormal results are displayed) Labs Reviewed  RAPID URINE DRUG SCREEN, HOSP PERFORMED - Abnormal; Notable for the following:       Result Value   Tetrahydrocannabinol POSITIVE (*)    All other components within normal limits  BASIC METABOLIC PANEL - Abnormal; Notable for the following:    Glucose, Bld 127 (*)    All other components within normal limits  CBC WITH DIFFERENTIAL/PLATELET    I-STAT TROPONIN, ED    EKG  EKG Interpretation  Date/Time:  Thursday May 28 2017 04:56:12 EDT Ventricular Rate:  102 PR Interval:    QRS Duration: 88 QT Interval:  321 QTC Calculation: 419 R Axis:   78 Text Interpretation:  Sinus tachycardia Borderline T wave abnormalities Borderline ST elevation, anterior leads No significant change since last tracing Confirmed by Zadie Rhine ([id number]) on 05/28/2017 5:07:22 AM Also confirmed by Zadie Rhine ([id number]), editor Elita Quick ([id number])  on 05/28/2017 6:57:18 AM       Radiology Dg  Chest 2 View  Result Date: 05/28/2017 CLINICAL DATA:  Left mid chest pain. EXAM: CHEST  2 VIEW COMPARISON:  04/08/2017 FINDINGS: Heart and mediastinal contours are within normal limits. No focal opacities or effusions. No acute bony abnormality. IMPRESSION: No active cardiopulmonary disease. Electronically Signed   By: Charlett Nose M.D.   On: 05/28/2017 08:49    Procedures Procedures (including critical care time)  Medications Ordered in ED Medications  sodium chloride 0.9 % bolus 1,000 mL (0 mLs Intravenous Stopped 05/28/17 0952)  ketorolac (TORADOL) 30 MG/ML injection 30 mg (30 mg Intravenous Given 05/28/17 0939)  ALPRAZolam Prudy Feeler) tablet 2 mg (2 mg Oral Given 05/28/17 1610)  aspirin chewable tablet 324 mg (324 mg Oral Given 05/28/17 9604)     Initial Impression / Assessment and Plan / ED Course  I have reviewed the triage vital signs and the nursing notes.  Pertinent labs & imaging results that were available during my care of the patient were reviewed by me and considered in my medical decision making (see chart for details).      Vitals:   05/28/17 0801 05/28/17 0900 05/28/17 0901 05/28/17 0930  BP:  114/69 114/68 (!) 106/59  Pulse:  (!) 56 (!) 56 (!) 53  Resp:  12 16 15   Temp:      TempSrc:      SpO2:  97% 97% 99%  Weight: 93.9 kg (207 lb)     Height: 5\' 7"  (1.702 m)       Medications  sodium chloride 0.9 % bolus 1,000 mL  (0 mLs Intravenous Stopped 05/28/17 0952)  ketorolac (TORADOL) 30 MG/ML injection 30 mg (30 mg Intravenous Given 05/28/17 0939)  ALPRAZolam (XANAX) tablet 2 mg (2 mg Oral Given 05/28/17 5409)  aspirin chewable tablet 324 mg (324 mg Oral Given 05/28/17 0938)    Ege Bogart is 26 y.o. male presenting with Chest pain and leg pain, patient is agitated. He is low risk by heart score, low risk by Wells criteria and PERC negative. EKG, chest x-ray and blood work including troponin reassuring. Patient given prescription for Vistaril for anxiety.  Evaluation does not show pathology that would require ongoing emergent intervention or inpatient treatment. Pt is hemodynamically stable and mentating appropriately. Discussed findings and plan with patient/guardian, who agrees with care plan. All questions answered. Return precautions discussed and outpatient follow up given.      Final Clinical Impressions(s) / ED Diagnoses   Final diagnoses:  Atypical chest pain  Anxiety  Nonintractable headache, unspecified chronicity pattern, unspecified headache type    New Prescriptions Discharge Medication List as of 05/28/2017  9:50 AM    START taking these medications   Details  hydrOXYzine (ATARAX/VISTARIL) 25 MG tablet Take 1 tablet (25 mg total) by mouth every 6 (six) hours as needed for anxiety., Starting Thu 05/28/2017, Print         Tyrome Donatelli, [place], PA-C 05/28/17 1322    Shaune Pollack, MD 05/29/17 1213

## 2017-05-28 NOTE — ED Notes (Signed)
EDPA Provider at bedside. 

## 2017-05-28 NOTE — ED Triage Notes (Signed)
Pt reports chest pain after activity also admited to EMS that he had been smoking weed and that he was out of his psych meds Xanax and lexapro. Vs BP=146/98 Hr=100 R=16 Sat 97% RA

## 2017-09-20 ENCOUNTER — Emergency Department (HOSPITAL_COMMUNITY): Payer: Self-pay

## 2017-09-20 ENCOUNTER — Emergency Department (HOSPITAL_COMMUNITY)
Admission: EM | Admit: 2017-09-20 | Discharge: 2017-09-20 | Disposition: A | Discharge status: Home / Self Care | Payer: Self-pay | Attending: Emergency Medicine | Admitting: Emergency Medicine

## 2017-09-20 ENCOUNTER — Encounter (HOSPITAL_COMMUNITY): Payer: Self-pay | Admitting: Emergency Medicine

## 2017-09-20 DIAGNOSIS — Z79899 Other long term (current) drug therapy: Secondary | ICD-10-CM | POA: Insufficient documentation

## 2017-09-20 DIAGNOSIS — F419 Anxiety disorder, unspecified: Secondary | ICD-10-CM | POA: Insufficient documentation

## 2017-09-20 DIAGNOSIS — R451 Restlessness and agitation: Secondary | ICD-10-CM | POA: Insufficient documentation

## 2017-09-20 LAB — CBC
HEMATOCRIT: 44.9 % (ref 39.0–52.0)
Hemoglobin: 15.5 g/dL (ref 13.0–17.0)
MCH: 29 pg (ref 26.0–34.0)
MCHC: 34.5 g/dL (ref 30.0–36.0)
MCV: 83.9 fL (ref 78.0–100.0)
PLATELETS: 245 10*3/uL (ref 150–400)
RBC: 5.35 MIL/uL (ref 4.22–5.81)
RDW: 13.4 % (ref 11.5–15.5)
WBC: 3.9 10*3/uL — ABNORMAL LOW (ref 4.0–10.5)

## 2017-09-20 LAB — I-STAT TROPONIN, ED: Troponin i, poc: 0 ng/mL (ref 0.00–0.08)

## 2017-09-20 LAB — BASIC METABOLIC PANEL
Anion gap: 8 (ref 5–15)
BUN: 6 mg/dL (ref 6–20)
CHLORIDE: 103 mmol/L (ref 101–111)
CO2: 28 mmol/L (ref 22–32)
CREATININE: 0.93 mg/dL (ref 0.61–1.24)
Calcium: 9.7 mg/dL (ref 8.9–10.3)
GFR calc Af Amer: >60 mL/min (ref >60)
GFR calc non Af Amer: >60 mL/min (ref >60)
GLUCOSE: 103 mg/dL — AB (ref 65–99)
POTASSIUM: 3.4 mmol/L — AB (ref 3.5–5.1)
SODIUM: 139 mmol/L (ref 135–145)

## 2017-09-20 LAB — HEPATIC FUNCTION PANEL
ALK PHOS: 46 U/L (ref 38–126)
ALT: 22 U/L (ref 17–63)
AST: 31 U/L (ref 15–41)
Albumin: 4.9 g/dL (ref 3.5–5.0)
BILIRUBIN DIRECT: 0.1 mg/dL (ref 0.1–0.5)
BILIRUBIN INDIRECT: 1.1 mg/dL — AB (ref 0.3–0.9)
BILIRUBIN TOTAL: 1.2 mg/dL (ref 0.3–1.2)
Total Protein: 8.7 g/dL — ABNORMAL HIGH (ref 6.5–8.1)

## 2017-09-20 MED ORDER — PAROXETINE HCL 20 MG PO TABS: 20.0000 mg | ORAL_TABLET | ORAL | 1 refills | Status: DC

## 2017-09-20 MED ORDER — LORAZEPAM 1 MG PO TABS
1.0000 mg | ORAL_TABLET | Freq: Once | ORAL | Status: AC
  Administered 2017-09-20: 1 mg via ORAL
  Filled 2017-09-20: qty 1

## 2017-09-20 MED ORDER — PAROXETINE HCL 20 MG PO TABS
20.0000 mg | ORAL_TABLET | Freq: Once | ORAL | Status: AC
  Administered 2017-09-20: 20 mg via ORAL
  Filled 2017-09-20: qty 1

## 2017-09-20 MED ORDER — HYDROXYZINE HCL 25 MG PO TABS: 25.0000 mg | ORAL_TABLET | Freq: Three times a day (TID) | ORAL | 1 refills | Status: DC | PRN

## 2017-09-20 NOTE — Discharge Instructions (Signed)
Follow up with a family md in a month

## 2017-09-20 NOTE — ED Triage Notes (Addendum)
Pt here via EMS with c/o panic attack. Pt has had multiple changes at home including a new job. Pt has hx of anxiety. Per pt he has had severe chest pain x 3 days. Pt states he sometimes has chest pain with anxiety, but states this is different. Pt is not able to state how it is different. Pt states he also has intermittent abdominal pain.

## 2017-09-20 NOTE — ED Notes (Signed)
Pt stated the medication he received did not make him feel any better. Made EDP aware.

## 2017-09-20 NOTE — ED Notes (Signed)
Pt is extremely anxious. Pt is constantly asking about lab work and results. RN informed pt of lab work that had resulted and was able to alleviate some anxiety.

## 2017-09-22 NOTE — ED Provider Notes (Signed)
[facility]-EMERGENCY DEPT Provider Note   CSN: [id number] Arrival date & time: 09/20/17  1937     History   Chief Complaint Chief Complaint  Patient presents with  . Panic Attack  . Chest Pain    HPI Larry Estes is a 26 y.o. male.  Patient complains of feeling anxious   The history is provided by the patient.  Anxiety  This is a new problem. The current episode started 12 to 24 hours ago. The problem occurs constantly. The problem has not changed since onset.Pertinent negatives include no chest pain, no abdominal pain and no headaches. Nothing aggravates the symptoms. Nothing relieves the symptoms.    Past Medical History:  Diagnosis Date  . Allergic angiitis (HCC)   . Allergy   . Anxiety   . Headache 12/26/2015  . Multiple allergies   . Sickle cell trait [facility](HCC)     Patient Active Problem List   Diagnosis Date Noted  . Headache 12/26/2015  . Myalgia 12/11/2015    History reviewed. No pertinent surgical history.     Home Medications    Prior to Admission medications   Medication Sig Start Date End Date Taking? Authorizing Provider  acetaminophen (TYLENOL) 500 MG tablet Take 1,000 mg by mouth every 6 (six) hours as needed for moderate pain.    [provider]  ALPRAZolam Prudy Feeler(XANAX) 1 MG tablet Take 1 mg by mouth 3 (three) times daily as needed for anxiety or sleep.     [provider]  busPIRone (BUSPAR) 15 MG tablet Take 15 mg by mouth 3 (three) times daily.    [provider]  cholecalciferol (VITAMIN D) 1000 units tablet Take 1,000 Units by mouth daily.    [provider]  escitalopram (LEXAPRO) 20 MG tablet Take 20 mg by mouth daily.    [provider]  hydrOXYzine (ATARAX/VISTARIL) 25 MG tablet Take 1 tablet (25 mg total) by mouth every 8 (eight) hours as needed for anxiety. 09/20/17   Bethann BerkshireZammit, Windi Toro, MD  ibuprofen (ADVIL,MOTRIN) 200 MG tablet Take 600 mg by mouth every 6 (six) hours as  needed for fever, headache, mild pain or moderate pain.     [provider]  meclizine (ANTIVERT) 25 MG tablet Take 1 tablet (25 mg total) by mouth 2 (two) times daily as needed for dizziness. 01/09/17   Shaune PollackIsaacs, Cameron, MD  meloxicam (MOBIC) 7.5 MG tablet Take 1 tablet (7.5 mg total) by mouth daily. 04/27/17   Caccavale, Sophia, PA-C  Multiple Vitamin (MULTIVITAMIN WITH MINERALS) TABS tablet Take 1 tablet by mouth daily.    [provider]  omeprazole (PRILOSEC) 20 MG capsule Take 1 capsule (20 mg total) by mouth daily. 02/17/17   Antony MaduraHumes, Kelly, PA-C  PARoxetine (PAXIL) 20 MG tablet Take 1 tablet (20 mg total) by mouth every morning. 09/20/17   Bethann BerkshireZammit, Jef Futch, MD  prochlorperazine (COMPAZINE) 10 MG tablet Take 1 tablet (10 mg total) by mouth every 8 (eight) hours as needed for nausea or vomiting. 01/09/17   Shaune PollackIsaacs, Cameron, MD  pyridOXINE (VITAMIN B-6) 100 MG tablet Take 200 mg by mouth daily.    [provider]  SUMAtriptan (IMITREX) 50 MG tablet Take 1 tablet (50 mg total) by mouth every 2 (two) hours as needed for migraine. May repeat in 2 hours if headache persists or recurs. 04/27/17   Caccavale, Sophia, PA-C  topiramate (TOPAMAX) 25 MG tablet Take one tablet at night for one week, then take 2 tablets at night for one  week, then take 3 tablets at night. Patient taking differently: Take 25 mg by mouth at bedtime.  11/12/16   Trixie DredgeWest, Emily, PA-C  vitamin A (VITAMIN A) 10000 UNIT capsule Take 20,000 Units by mouth daily.    [provider]  vitamin B-12 (CYANOCOBALAMIN) 1000 MCG tablet Take 1,000 mcg by mouth daily.    [provider]  vitamin C (ASCORBIC ACID) 500 MG tablet Take 500 mg by mouth daily.    [provider]  vitamin E 400 UNIT capsule Take 800 Units by mouth daily.     [provider]    Family History Family History  Problem Relation Age of Onset  . Hypertension Mother   . Hypertension Maternal Grandmother   . Hypertension  Paternal Grandmother   . CAD Other   . Hypertension Other   . Diabetes Other     Social History Social History   Tobacco Use  . Smoking status: Never Smoker  . Smokeless tobacco: Never Used  Substance Use Topics  . Alcohol use: Yes    Comment: socially  . Drug use: No     Allergies   Fish allergy; Reglan [metoclopramide]; and Tramadol   Review of Systems Review of Systems  Constitutional: Negative for appetite change and fatigue.  HENT: Negative for congestion, ear discharge and sinus pressure.   Eyes: Negative for discharge.  Respiratory: Negative for cough.   Cardiovascular: Negative for chest pain.  Gastrointestinal: Negative for abdominal pain and diarrhea.  Genitourinary: Negative for frequency and hematuria.  Musculoskeletal: Negative for back pain.  Skin: Negative for rash.  Neurological: Negative for seizures and headaches.  Psychiatric/Behavioral: Positive for agitation. Negative for hallucinations.     Physical Exam Updated Vital Signs BP 120/73   Pulse 62   Temp 97.8 F (36.6 C) (Oral)   Resp 18   SpO2 100%   Physical Exam  Constitutional: He is oriented to person, place, and time. He appears well-developed.  HENT:  Head: Normocephalic.  Eyes: Conjunctivae and EOM are normal. No scleral icterus.  Neck: Neck supple. No thyromegaly present.  Cardiovascular: Normal rate and regular rhythm. Exam reveals no gallop and no friction rub.  No murmur heard. Pulmonary/Chest: No stridor. He has no wheezes. He has no rales. He exhibits no tenderness.  Abdominal: He exhibits no distension. There is no tenderness. There is no rebound.  Musculoskeletal: Normal range of motion. He exhibits no edema.  Lymphadenopathy:    He has no cervical adenopathy.  Neurological: He is oriented to person, place, and time. He exhibits normal muscle tone. Coordination normal.  Skin: No rash noted. No erythema.  Psychiatric:  Patient moderately anxious     ED Treatments /  Results  Labs (all labs ordered are listed, but only abnormal results are displayed) Labs Reviewed  BASIC METABOLIC PANEL - Abnormal; Notable for the following components:      Result Value   Potassium 3.4 (*)    Glucose, Bld 103 (*)    All other components within normal limits  CBC - Abnormal; Notable for the following components:   WBC 3.9 (*)    All other components within normal limits  HEPATIC FUNCTION PANEL - Abnormal; Notable for the following components:   Total Protein 8.7 (*)    Indirect Bilirubin 1.1 (*)    All other components within normal limits  I-STAT TROPONIN, ED    EKG  EKG Interpretation  Date/Time:  Sunday September 20 2017 20:01:33 EST Ventricular Rate:  59 PR  Interval:    QRS Duration: 85 QT Interval:  380 QTC Calculation: 377 R Axis:   73 Text Interpretation:  Sinus rhythm Baseline wander in lead(s) V2 Confirmed by Bethann Berkshire [phone]) on 09/20/2017 9:18:33 PM       Radiology Dg Chest 2 View  Result Date: 09/20/2017 CLINICAL DATA:  Chest pain EXAM: CHEST  2 VIEW COMPARISON:  05/28/2017 FINDINGS: Heart and mediastinal contours are within normal limits. No focal opacities or effusions. No acute bony abnormality. IMPRESSION: No active cardiopulmonary disease. Electronically Signed   By: Charlett Nose M.D.   On: 09/20/2017 20:32    Procedures Procedures (including critical care time)  Medications Ordered in ED Medications  LORazepam (ATIVAN) tablet 1 mg (1 mg Oral Given 09/20/17 2157)  PARoxetine (PAXIL) tablet 20 mg (20 mg Oral Given 09/20/17 2314)     Initial Impression / Assessment and Plan / ED Course  I have reviewed the triage vital signs and the nursing notes.  Pertinent labs & imaging results that were available during my care of the patient were reviewed by me and considered in my medical decision making (see chart for details).    Patient is placed on Paxil and Vistaril and will follow-up with PCP  Final Clinical Impressions(s) /  ED Diagnoses   Final diagnoses:  Anxiety    ED Discharge Orders        Ordered    PARoxetine (PAXIL) 20 MG tablet  BH-each morning     09/20/17 2310    hydrOXYzine (ATARAX/VISTARIL) 25 MG tablet  Every 8 hours PRN     09/20/17 2310       Bethann Berkshire, MD 09/22/17 Ernestina Columbia

## 2017-10-22 ENCOUNTER — Emergency Department (HOSPITAL_COMMUNITY)
Admission: EM | Admit: 2017-10-22 | Discharge: 2017-10-22 | Disposition: A | Discharge status: Home / Self Care | Payer: Self-pay | Attending: Emergency Medicine | Admitting: Emergency Medicine

## 2017-10-22 ENCOUNTER — Encounter (HOSPITAL_COMMUNITY): Payer: Self-pay | Admitting: Emergency Medicine

## 2017-10-22 DIAGNOSIS — R197 Diarrhea, unspecified: Secondary | ICD-10-CM | POA: Insufficient documentation

## 2017-10-22 DIAGNOSIS — Z5321 Procedure and treatment not carried out due to patient leaving prior to being seen by health care provider: Secondary | ICD-10-CM | POA: Insufficient documentation

## 2017-10-22 NOTE — ED Notes (Signed)
Called  No response from lobby 

## 2017-10-22 NOTE — ED Triage Notes (Signed)
Pt comes from apartment via EMS with complaints of vomiting with blood tinged emesis and having bloody diarrhea for the past 3 days.  Pt ambulatory and does not seem to be in any acute distress at this time.  No emesis in route. Vitals WNL.

## 2017-10-22 NOTE — ED Notes (Signed)
No response when called for room 

## 2017-12-23 DIAGNOSIS — K029 Dental caries, unspecified: Secondary | ICD-10-CM | POA: Insufficient documentation

## 2017-12-23 DIAGNOSIS — R112 Nausea with vomiting, unspecified: Secondary | ICD-10-CM | POA: Insufficient documentation

## 2017-12-23 DIAGNOSIS — Z79899 Other long term (current) drug therapy: Secondary | ICD-10-CM | POA: Insufficient documentation

## 2017-12-23 DIAGNOSIS — R197 Diarrhea, unspecified: Secondary | ICD-10-CM | POA: Insufficient documentation

## 2017-12-23 DIAGNOSIS — R6883 Chills (without fever): Secondary | ICD-10-CM | POA: Insufficient documentation

## 2017-12-23 DIAGNOSIS — R109 Unspecified abdominal pain: Secondary | ICD-10-CM | POA: Insufficient documentation

## 2017-12-23 DIAGNOSIS — R51 Headache: Secondary | ICD-10-CM | POA: Insufficient documentation

## 2017-12-24 ENCOUNTER — Emergency Department (HOSPITAL_COMMUNITY)
Admission: EM | Admit: 2017-12-24 | Discharge: 2017-12-24 | Disposition: A | Discharge status: Home / Self Care | Payer: Self-pay | Attending: Emergency Medicine | Admitting: Emergency Medicine

## 2017-12-24 ENCOUNTER — Encounter (HOSPITAL_COMMUNITY): Payer: Self-pay

## 2017-12-24 DIAGNOSIS — R197 Diarrhea, unspecified: Secondary | ICD-10-CM

## 2017-12-24 DIAGNOSIS — R519 Headache, unspecified: Secondary | ICD-10-CM

## 2017-12-24 DIAGNOSIS — R112 Nausea with vomiting, unspecified: Secondary | ICD-10-CM

## 2017-12-24 DIAGNOSIS — R51 Headache: Secondary | ICD-10-CM

## 2017-12-24 DIAGNOSIS — K047 Periapical abscess without sinus: Secondary | ICD-10-CM

## 2017-12-24 DIAGNOSIS — K029 Dental caries, unspecified: Secondary | ICD-10-CM

## 2017-12-24 LAB — CBC
HCT: 43.4 % (ref 39.0–52.0)
Hemoglobin: 14.6 g/dL (ref 13.0–17.0)
MCH: 28.6 pg (ref 26.0–34.0)
MCHC: 33.6 g/dL (ref 30.0–36.0)
MCV: 85.1 fL (ref 78.0–100.0)
PLATELETS: 240 10*3/uL (ref 150–400)
RBC: 5.1 MIL/uL (ref 4.22–5.81)
RDW: 13.1 % (ref 11.5–15.5)
WBC: 5.4 10*3/uL (ref 4.0–10.5)

## 2017-12-24 LAB — COMPREHENSIVE METABOLIC PANEL
ALBUMIN: 4.5 g/dL (ref 3.5–5.0)
ALK PHOS: 44 U/L (ref 38–126)
ALT: 31 U/L (ref 17–63)
ANION GAP: 8 (ref 5–15)
AST: 41 U/L (ref 15–41)
BILIRUBIN TOTAL: 0.6 mg/dL (ref 0.3–1.2)
BUN: 12 mg/dL (ref 6–20)
CO2: 29 mmol/L (ref 22–32)
CREATININE: 1.06 mg/dL (ref 0.61–1.24)
Calcium: 9.6 mg/dL (ref 8.9–10.3)
Chloride: 102 mmol/L (ref 101–111)
GFR calc non Af Amer: >60 mL/min (ref >60)
GLUCOSE: 101 mg/dL — AB (ref 65–99)
Potassium: 3.7 mmol/L (ref 3.5–5.1)
SODIUM: 139 mmol/L (ref 135–145)
TOTAL PROTEIN: 7.8 g/dL (ref 6.5–8.1)

## 2017-12-24 LAB — HIV ANTIBODY (ROUTINE TESTING W REFLEX): HIV SCREEN 4TH GENERATION: NONREACTIVE

## 2017-12-24 LAB — LIPASE, BLOOD: Lipase: 30 U/L (ref 11–51)

## 2017-12-24 MED ORDER — ONDANSETRON HCL 4 MG PO TABS: 4.0000 mg | ORAL_TABLET | Freq: Four times a day (QID) | ORAL | 0 refills | Status: DC

## 2017-12-24 MED ORDER — ONDANSETRON 8 MG PO TBDP
8.0000 mg | ORAL_TABLET | Freq: Once | ORAL | Status: AC
  Administered 2017-12-24: 8 mg via ORAL
  Filled 2017-12-24: qty 1

## 2017-12-24 MED ORDER — PENICILLIN V POTASSIUM 500 MG PO TABS: 500.0000 mg | ORAL_TABLET | Freq: Three times a day (TID) | ORAL | 0 refills | Status: DC

## 2017-12-24 MED ORDER — KETOROLAC TROMETHAMINE 60 MG/2ML IM SOLN
60.0000 mg | Freq: Once | INTRAMUSCULAR | Status: AC
  Administered 2017-12-24: 60 mg via INTRAMUSCULAR
  Filled 2017-12-24: qty 2

## 2017-12-24 NOTE — ED Triage Notes (Signed)
Pt presents with c/o headaches, vomiting, and diarrhea. Pt reports the symptoms for the past few days. Pt reports he feels dizzy with the headaches as well. Pt reports he has been able to eat but is throwing his food up or having diarrhea.

## 2017-12-24 NOTE — Discharge Instructions (Signed)
Take ibuprofen or tylenol for headache. Take zofran for nausea and vomiting. Take penicillin as prescribed until all gone for your dental infection. Follow up with family doctor a dentist for recheck. Your HIV test is pending and if comes back abnormal we will call you.

## 2017-12-24 NOTE — ED Provider Notes (Signed)
[facility]-EMERGENCY DEPT Provider Note   CSN: [id number] Arrival date & time: 12/23/17  2346     History   Chief Complaint Chief Complaint  Patient presents with  . Headache  . Emesis    HPI Larry Estes is a 27 y.o. male.  HPI Larry Estes is a 27 y.o. male with hx of anxiety, allergies, headaches, presents to ED with multiple complaints. States for the last week, has had headache, chills, nausea, vomiting, diarrhea, abdominal pain. States he believes that his symptoms are all caused by a bad tooth in his right lower jaw. States he has had a bad tooth for 2 years. Unable to see a dentist due to financial reasons. Denies any neck pain. No chest pain. No URI symptoms. No facial swelling. Has been eating baking soda at home "to settle stomach." states having about 2 episodes of emesis daily and bout 4 episodes of watery diarrhea. No blood in his stool or emesis.   Past Medical History:  Diagnosis Date  . Allergic angiitis (HCC)   . Allergy   . Anxiety   . Headache 12/26/2015  . Multiple allergies   . Sickle cell trait [facility](HCC)     Patient Active Problem List   Diagnosis Date Noted  . Headache 12/26/2015  . Myalgia 12/11/2015    History reviewed. No pertinent surgical history.     Home Medications    Prior to Admission medications   Medication Sig Start Date End Date Taking? Authorizing Provider  acetaminophen (TYLENOL) 500 MG tablet Take 1,000 mg by mouth every 6 (six) hours as needed for moderate pain.    [provider]  ALPRAZolam Prudy Feeler(XANAX) 1 MG tablet Take 1 mg by mouth 3 (three) times daily as needed for anxiety or sleep.     [provider]  busPIRone (BUSPAR) 15 MG tablet Take 15 mg by mouth 3 (three) times daily.    [provider]  cholecalciferol (VITAMIN D) 1000 units tablet Take 1,000 Units by mouth daily.    [provider]  escitalopram (LEXAPRO) 20 MG tablet Take 20 mg by mouth daily.    [provider]  hydrOXYzine (ATARAX/VISTARIL) 25 MG tablet Take 1 tablet (25 mg total) by mouth every 8 (eight) hours as needed for anxiety. 09/20/17   Bethann BerkshireZammit, Joseph, MD  ibuprofen (ADVIL,MOTRIN) 200 MG tablet Take 600 mg by mouth every 6 (six) hours as needed for fever, headache, mild pain or moderate pain.     [provider]  meclizine (ANTIVERT) 25 MG tablet Take 1 tablet (25 mg total) by mouth 2 (two) times daily as needed for dizziness. 01/09/17   Shaune PollackIsaacs, Cameron, MD  meloxicam (MOBIC) 7.5 MG tablet Take 1 tablet (7.5 mg total) by mouth daily. 04/27/17   Caccavale, Sophia, PA-C  Multiple Vitamin (MULTIVITAMIN WITH MINERALS) TABS tablet Take 1 tablet by mouth daily.    [provider]  omeprazole (PRILOSEC) 20 MG capsule Take 1 capsule (20 mg total) by mouth daily. 02/17/17   Antony MaduraHumes, Kelly, PA-C  PARoxetine (PAXIL) 20 MG tablet Take 1 tablet (20 mg total) by mouth every morning. 09/20/17   Bethann BerkshireZammit, Joseph, MD  prochlorperazine (COMPAZINE) 10 MG tablet Take 1 tablet (10 mg total) by mouth every 8 (eight) hours as needed for nausea or vomiting. 01/09/17   Shaune PollackIsaacs, Cameron, MD  pyridOXINE (VITAMIN B-6) 100 MG tablet Take 200 mg by mouth daily.    [provider]  SUMAtriptan (IMITREX) 50 MG tablet Take 1  tablet (50 mg total) by mouth every 2 (two) hours as needed for migraine. May repeat in 2 hours if headache persists or recurs. 04/27/17   Caccavale, Sophia, PA-C  topiramate (TOPAMAX) 25 MG tablet Take one tablet at night for one week, then take 2 tablets at night for one week, then take 3 tablets at night. Patient taking differently: Take 25 mg by mouth at bedtime.  11/12/16   Trixie Dredge, PA-C  vitamin A (VITAMIN A) 10000 UNIT capsule Take 20,000 Units by mouth daily.    [provider]  vitamin B-12 (CYANOCOBALAMIN) 1000 MCG tablet Take 1,000 mcg by mouth daily.    [provider]  vitamin C (ASCORBIC ACID) 500 MG tablet Take 500 mg by mouth daily.    [provider]  vitamin E 400 UNIT capsule Take 800 Units by mouth daily.     [provider]  potassium chloride SA (K-DUR,KLOR-CON) 20 MEQ tablet Take 1 tablet (20 mEq total) by mouth 2 (two) times daily. Patient not taking: Reported on 03/04/2015 01/04/15 03/11/15  Devoria Albe, MD    Family History Family History  Problem Relation Age of Onset  . Hypertension Mother   . Hypertension Maternal Grandmother   . Hypertension Paternal Grandmother   . CAD Other   . Hypertension Other   . Diabetes Other     Social History Social History   Tobacco Use  . Smoking status: Never Smoker  . Smokeless tobacco: Never Used  Substance Use Topics  . Alcohol use: Yes    Comment: socially  . Drug use: No     Allergies   Fish allergy; Reglan [metoclopramide]; and Tramadol   Review of Systems Review of Systems  Constitutional: Positive for chills.  Respiratory: Negative for cough, chest tightness and shortness of breath.   Cardiovascular: Negative for chest pain, palpitations and leg swelling.  Gastrointestinal: Positive for abdominal pain, diarrhea and vomiting. Negative for abdominal distention and nausea.  Genitourinary: Negative for dysuria, frequency, hematuria and urgency.  Musculoskeletal: Negative for arthralgias, myalgias, neck pain and neck stiffness.  Skin: Negative for rash.  Allergic/Immunologic: Negative for immunocompromised state.  Neurological: Positive for headaches. Negative for dizziness, weakness, light-headedness and numbness.  All other systems reviewed and are negative.    Physical Exam Updated Vital Signs BP 120/77 (BP Location: Left Arm)   Pulse 91   Temp 99.1 F (37.3 C) (Oral)   Resp 18   Ht 5\' 7"  (1.702 m)   Wt 86.2 kg (190 lb)   SpO2 100%   BMI 29.76 kg/m   Physical Exam  Constitutional: He is oriented to person, place, and time. He appears well-developed and well-nourished. No distress.  HENT:  Head: Normocephalic and atraumatic.  Nose:  Nose normal.  Large carries in the right lower first molar, with no surrounding erythema, swelling.  Tender to the tap.  No trismus.  No swelling of the tongue.  Oropharynx normal.  TMs normal bilaterally.  Eyes: Pupils are equal, round, and reactive to light. Conjunctivae and EOM are normal.  Neck: Normal range of motion. Neck supple.  Cardiovascular: Normal rate, regular rhythm and normal heart sounds.  Pulmonary/Chest: Effort normal. No respiratory distress. He has no wheezes. He has no rales.  Abdominal: Soft. Bowel sounds are normal. He exhibits no distension. There is no tenderness. There is no rebound.  Musculoskeletal: He exhibits no edema.  Neurological: He is alert and oriented to person, place, and time. No cranial nerve deficit. Coordination normal.  Skin: Skin is warm and dry.  Nursing note and vitals reviewed.    ED Treatments / Results  Labs (all labs ordered are listed, but only abnormal results are displayed) Labs Reviewed  COMPREHENSIVE METABOLIC PANEL - Abnormal; Notable for the following components:      Result Value   Glucose, Bld 101 (*)    All other components within normal limits  LIPASE, BLOOD  CBC  URINALYSIS, ROUTINE W REFLEX MICROSCOPIC  HIV ANTIBODY (ROUTINE TESTING)    EKG  EKG Interpretation None       Radiology No results found.  Procedures Procedures (including critical care time)  Medications Ordered in ED Medications  ketorolac (TORADOL) injection 60 mg (has no administration in time range)  ondansetron (ZOFRAN-ODT) disintegrating tablet 8 mg (has no administration in time range)     Initial Impression / Assessment and Plan / ED Course  I have reviewed the triage vital signs and the nursing notes.  Pertinent labs & imaging results that were available during my care of the patient were reviewed by me and considered in my medical decision making (see chart for details).     Pt in ED with multiple complaints.  He appears to be  very anxious.  He is complaining of a week of nausea, vomiting, diarrhea, headache, chills, dental pain.  He believes all of his symptoms are caused by bad tooth.  On exam, patient does appear to have a large caries, however no obvious signs of infection.  He is nontoxic-appearing.  His exam was otherwise completely normal.  He is afebrile here.  He has normal white blood cell count.  He requested HIV test.  We will obtain that.  His abdomen is benign.  He has been multiple times if we have checked his white blood cell count hemoglobin, liver function, kidney function, I explained to him he has rechecked all of and all is unremarkable.  He is here with his daughter who is also being seen as a patient.  I think his symptoms are most likely caused by a viral infection.  He is able to keep fluids down as he is sipping on the water in the room.  However given Zofran and Toradol for his headache in the emergency department.  We will discharged home with penicillin for possible early tooth infection, and Zofran for his nausea and vomiting.  We will have him follow-up with a dentist and the family doctor for recheck.  Return precautions discussed.  Vitals:   12/23/17 2359 12/24/17 0007 12/24/17 0324 12/24/17 0659  BP: 133/75  120/77 133/79  Pulse: 75  91 72  Resp: 18  18 16   Temp: 98.9 F (37.2 C)  99.1 F (37.3 C)   TempSrc: Oral  Oral   SpO2: 100%  100% 99%  Weight:  86.2 kg (190 lb)    Height:  5\' 7"  (1.702 m)       Final Clinical Impressions(s) / ED Diagnoses   Final diagnoses:  Nausea vomiting and diarrhea  Nonintractable headache, unspecified chronicity pattern, unspecified headache type  Infected dental carries    ED Discharge Orders        Ordered    ondansetron (ZOFRAN) 4 MG tablet  Every 6 hours     12/24/17 0707    penicillin v potassium (VEETID) 500 MG tablet  3 times daily     12/24/17 0707       Jaynie Crumble, PA-C 12/24/17 0709    Dione Booze, MD 12/24/17  0716  

## 2017-12-31 NOTE — ED Notes (Signed)
12/31/2017,  Pt. Called and requested results of his HIV testing.  Results reviewed. All questions answered. Pt.s phone information is updated per pt.

## 2018-02-02 ENCOUNTER — Encounter (HOSPITAL_COMMUNITY): Payer: Self-pay | Admitting: Emergency Medicine

## 2018-02-02 ENCOUNTER — Emergency Department (HOSPITAL_COMMUNITY)
Admission: EM | Admit: 2018-02-02 | Discharge: 2018-02-03 | Disposition: A | Discharge status: Home / Self Care | Payer: Self-pay | Attending: Emergency Medicine | Admitting: Emergency Medicine

## 2018-02-02 DIAGNOSIS — R1010 Upper abdominal pain, unspecified: Secondary | ICD-10-CM | POA: Insufficient documentation

## 2018-02-02 DIAGNOSIS — Z79899 Other long term (current) drug therapy: Secondary | ICD-10-CM | POA: Insufficient documentation

## 2018-02-02 DIAGNOSIS — J3489 Other specified disorders of nose and nasal sinuses: Secondary | ICD-10-CM | POA: Insufficient documentation

## 2018-02-02 DIAGNOSIS — R197 Diarrhea, unspecified: Secondary | ICD-10-CM | POA: Insufficient documentation

## 2018-02-02 DIAGNOSIS — R112 Nausea with vomiting, unspecified: Secondary | ICD-10-CM | POA: Insufficient documentation

## 2018-02-02 LAB — CBC
HEMATOCRIT: 42.4 % (ref 39.0–52.0)
HEMOGLOBIN: 14.5 g/dL (ref 13.0–17.0)
MCH: 28.8 pg (ref 26.0–34.0)
MCHC: 34.2 g/dL (ref 30.0–36.0)
MCV: 84.1 fL (ref 78.0–100.0)
Platelets: 202 10*3/uL (ref 150–400)
RBC: 5.04 MIL/uL (ref 4.22–5.81)
RDW: 13.3 % (ref 11.5–15.5)
WBC: 8.5 10*3/uL (ref 4.0–10.5)

## 2018-02-02 LAB — COMPREHENSIVE METABOLIC PANEL
ALBUMIN: 4.1 g/dL (ref 3.5–5.0)
ALT: 34 U/L (ref 17–63)
ANION GAP: 9 (ref 5–15)
AST: 43 U/L — ABNORMAL HIGH (ref 15–41)
Alkaline Phosphatase: 37 U/L — ABNORMAL LOW (ref 38–126)
BUN: 11 mg/dL (ref 6–20)
CHLORIDE: 107 mmol/L (ref 101–111)
CO2: 22 mmol/L (ref 22–32)
Calcium: 9.1 mg/dL (ref 8.9–10.3)
Creatinine, Ser: 1.08 mg/dL (ref 0.61–1.24)
GFR calc non Af Amer: >60 mL/min (ref >60)
Glucose, Bld: 139 mg/dL — ABNORMAL HIGH (ref 65–99)
Potassium: 3.6 mmol/L (ref 3.5–5.1)
Sodium: 138 mmol/L (ref 135–145)
Total Bilirubin: 1.1 mg/dL (ref 0.3–1.2)
Total Protein: 7.6 g/dL (ref 6.5–8.1)

## 2018-02-02 LAB — LIPASE, BLOOD: LIPASE: 54 U/L — AB (ref 11–51)

## 2018-02-02 MED ORDER — MORPHINE SULFATE (PF) 4 MG/ML IV SOLN
4.0000 mg | Freq: Once | INTRAVENOUS | Status: AC
  Administered 2018-02-03: 4 mg via INTRAVENOUS
  Filled 2018-02-02: qty 1

## 2018-02-02 MED ORDER — ONDANSETRON 4 MG PO TBDP
4.0000 mg | ORAL_TABLET | Freq: Once | ORAL | Status: AC | PRN
  Administered 2018-02-02: 4 mg via ORAL
  Filled 2018-02-02: qty 1

## 2018-02-02 MED ORDER — ONDANSETRON HCL 4 MG/2ML IJ SOLN
4.0000 mg | Freq: Once | INTRAMUSCULAR | Status: AC
  Administered 2018-02-03: 4 mg via INTRAVENOUS
  Filled 2018-02-02: qty 2

## 2018-02-02 MED ORDER — SODIUM CHLORIDE 0.9 % IV BOLUS
1000.0000 mL | Freq: Once | INTRAVENOUS | Status: AC
  Administered 2018-02-03: 1000 mL via INTRAVENOUS

## 2018-02-02 NOTE — ED Notes (Signed)
Patient adds that he having ulcer on bottom of tongue for couple days.

## 2018-02-02 NOTE — ED Provider Notes (Signed)
[facility]-EMERGENCY DEPT Provider Note   CSN: [id number] Arrival date & time: 02/02/18  1824     History   Chief Complaint Chief Complaint  Patient presents with  . Emesis  . Diarrhea  . Generalized Body Aches    HPI Larry Estes is a 27 y.o. male.  Patient presents to the ED with a chief complaint of nausea, vomiting, and diarrhea x 2 days.  He denies any blood in his vomit or stool.  He has not taken anything for his symptoms.  He reports associated crampy upper abdominal pain.  He denies drug or heave alcohol use.   Patient also complains of sinus pressure x 1 week.  He states that it is causing him to have headache.  The history is provided by the patient. No language interpreter was used.    Past Medical History:  Diagnosis Date  . Allergic angiitis (HCC)   . Allergy   . Anxiety   . Headache 12/26/2015  . Multiple allergies   . Sickle cell trait [facility])     Patient Active Problem List   Diagnosis Date Noted  . Headache 12/26/2015  . Myalgia 12/11/2015    History reviewed. No pertinent surgical history.      Home Medications    Prior to Admission medications   Medication Sig Start Date End Date Taking? Authorizing Provider  acetaminophen (TYLENOL) 500 MG tablet Take 1,000 mg by mouth every 6 (six) hours as needed for moderate pain.    [provider]  ALPRAZolam Prudy Feeler) 1 MG tablet Take 1 mg by mouth 3 (three) times daily as needed for anxiety or sleep.     [provider]  busPIRone (BUSPAR) 15 MG tablet Take 15 mg by mouth 3 (three) times daily.    [provider]  cholecalciferol (VITAMIN D) 1000 units tablet Take 1,000 Units by mouth daily.    [provider]  escitalopram (LEXAPRO) 20 MG tablet Take 20 mg by mouth daily.    [provider]  hydrOXYzine (ATARAX/VISTARIL) 25 MG tablet Take 1 tablet (25 mg total) by mouth every 8 (eight) hours as needed for anxiety. 09/20/17   Bethann Berkshire, MD  ibuprofen (ADVIL,MOTRIN) 200 MG tablet Take 600 mg by mouth every 6 (six) hours as needed for fever, headache, mild pain or moderate pain.     [provider]  meclizine (ANTIVERT) 25 MG tablet Take 1 tablet (25 mg total) by mouth 2 (two) times daily as needed for dizziness. 01/09/17   Shaune Pollack, MD  meloxicam (MOBIC) 7.5 MG tablet Take 1 tablet (7.5 mg total) by mouth daily. 04/27/17   Caccavale, Sophia, PA-C  Multiple Vitamin (MULTIVITAMIN WITH MINERALS) TABS tablet Take 1 tablet by mouth daily.    [provider]  omeprazole (PRILOSEC) 20 MG capsule Take 1 capsule (20 mg total) by mouth daily. 02/17/17   Antony Madura, PA-C  ondansetron (ZOFRAN) 4 MG tablet Take 1 tablet (4 mg total) by mouth every 6 (six) hours. 12/24/17   Kirichenko, Lemont Fillers, PA-C  PARoxetine (PAXIL) 20 MG tablet Take 1 tablet (20 mg total) by mouth every morning. 09/20/17   Bethann Berkshire, MD  penicillin v potassium (VEETID) 500 MG tablet Take 1 tablet (500 mg total) by mouth 3 (three) times daily. 12/24/17   Kirichenko, Lemont Fillers, PA-C  prochlorperazine (COMPAZINE) 10 MG tablet Take 1 tablet (10 mg total) by mouth every 8 (eight) hours as needed for nausea or vomiting. 01/09/17   Shaune Pollack,  MD  pyridOXINE (VITAMIN B-6) 100 MG tablet Take 200 mg by mouth daily.    [provider]  SUMAtriptan (IMITREX) 50 MG tablet Take 1 tablet (50 mg total) by mouth every 2 (two) hours as needed for migraine. May repeat in 2 hours if headache persists or recurs. 04/27/17   Caccavale, Sophia, PA-C  topiramate (TOPAMAX) 25 MG tablet Take one tablet at night for one week, then take 2 tablets at night for one week, then take 3 tablets at night. Patient taking differently: Take 25 mg by mouth at bedtime.  11/12/16   Trixie Dredge, PA-C  vitamin A (VITAMIN A) 10000 UNIT capsule Take 20,000 Units by mouth daily.    [provider]  vitamin B-12 (CYANOCOBALAMIN) 1000 MCG tablet Take 1,000 mcg by mouth daily.     [provider]  vitamin C (ASCORBIC ACID) 500 MG tablet Take 500 mg by mouth daily.    [provider]  vitamin E 400 UNIT capsule Take 800 Units by mouth daily.     [provider]    Family History Family History  Problem Relation Age of Onset  . Hypertension Mother   . Hypertension Maternal Grandmother   . Hypertension Paternal Grandmother   . CAD Other   . Hypertension Other   . Diabetes Other     Social History Social History   Tobacco Use  . Smoking status: Not on file  . Smokeless tobacco: Never Used  Substance Use Topics  . Alcohol use: Yes    Comment: socially  . Drug use: No     Allergies   Fish allergy; Reglan [metoclopramide]; and Tramadol   Review of Systems Review of Systems  All other systems reviewed and are negative.    Physical Exam Updated Vital Signs BP 128/73 (BP Location: Left Arm)   Pulse 74   Temp 99.5 F (37.5 C) (Oral)   Resp 15   Ht  (1.702 m)   Wt 81.6 kg (180 lb)   SpO2 100%   BMI 28.19 kg/m   Physical Exam  Constitutional: He is oriented to person, place, and time. He appears well-developed and well-nourished.  HENT:  Head: Normocephalic and atraumatic.  Eyes: Pupils are equal, round, and reactive to light. Conjunctivae and EOM are normal. Right eye exhibits no discharge. Left eye exhibits no discharge. No scleral icterus.  Neck: Normal range of motion. Neck supple. No JVD present.  Cardiovascular: Normal rate, regular rhythm and normal heart sounds. Exam reveals no gallop and no friction rub.  No murmur heard. Pulmonary/Chest: Effort normal and breath sounds normal. No respiratory distress. He has no wheezes. He has no rales. He exhibits no tenderness.  Abdominal: Soft. He exhibits no distension and no mass. There is no tenderness. There is no rebound and no guarding.  Musculoskeletal: Normal range of motion. He exhibits no edema or tenderness.  Neurological: He is alert and oriented to  person, place, and time.  Skin: Skin is warm and dry.  Psychiatric: He has a normal mood and affect. His behavior is normal. Judgment and thought content normal.  Nursing note and vitals reviewed.    ED Treatments / Results  Labs (all labs ordered are listed, but only abnormal results are displayed) Labs Reviewed  LIPASE, BLOOD - Abnormal; Notable for the following components:      Result Value   Lipase 54 (*)    All other components within normal limits  COMPREHENSIVE METABOLIC PANEL - Abnormal; Notable for  the following components:   Glucose, Bld 139 (*)    AST 43 (*)    Alkaline Phosphatase 37 (*)    All other components within normal limits  CBC  URINALYSIS, ROUTINE W REFLEX MICROSCOPIC    EKG None  Radiology No results found.  Procedures Procedures (including critical care time)  Medications Ordered in ED Medications  sodium chloride 0.9 % bolus 1,000 mL (has no administration in time range)  morphine 4 MG/ML injection 4 mg (has no administration in time range)  ondansetron (ZOFRAN) injection 4 mg (has no administration in time range)  ondansetron (ZOFRAN-ODT) disintegrating tablet 4 mg (4 mg Oral Given 02/02/18 1833)     Initial Impression / Assessment and Plan / ED Course  I have reviewed the triage vital signs and the nursing notes.  Pertinent labs & imaging results that were available during my care of the patient were reviewed by me and considered in my medical decision making (see chart for details).     Patient with n/v/d.  Onset 2 days ago.  Abdomen is soft and non-tender.  Also possible sinusitis.  Will treat and reassess.  Patient given fluids, pain medicine, nausea medicine.  Signs are stable.  Patient well-appearing.  Will discharge to home.  Recommend close follow-up with PCP.  He also reports having an ulcer on his tongue, but is uncertain where it is coming from.  I have encouraged close monitoring and dental follow-up.  Cover for  sinusitis.  Final Clinical Impressions(s) / ED Diagnoses   Final diagnoses:  Nausea vomiting and diarrhea  Sinus pressure    ED Discharge Orders        Ordered    ondansetron (ZOFRAN ODT) 4 MG disintegrating tablet  Every 8 hours PRN     02/03/18 0200    amoxicillin-clavulanate (AUGMENTIN) 875-125 MG tablet  Every 12 hours     02/03/18 0200       Roxy Horseman, PA-C 02/03/18 0201    Devoria Albe, MD 02/03/18 [phone]

## 2018-02-02 NOTE — ED Triage Notes (Signed)
Pt c/o n/v/d and body aches x 4 days. Denies urinary problems. Reports that "eveybody been around" when asked if been around anyone sick.

## 2018-02-03 LAB — URINALYSIS, ROUTINE W REFLEX MICROSCOPIC
Bilirubin Urine: NEGATIVE
Glucose, UA: NEGATIVE mg/dL
KETONES UR: NEGATIVE mg/dL
LEUKOCYTES UA: NEGATIVE
Nitrite: NEGATIVE
PROTEIN: NEGATIVE mg/dL
Specific Gravity, Urine: 1.018 (ref 1.005–1.030)
pH: 6 (ref 5.0–8.0)

## 2018-02-03 MED ORDER — AMOXICILLIN-POT CLAVULANATE 875-125 MG PO TABS: 1.0000 | ORAL_TABLET | Freq: Two times a day (BID) | ORAL | 0 refills | Status: DC

## 2018-02-03 MED ORDER — ONDANSETRON 4 MG PO TBDP: 4.0000 mg | ORAL_TABLET | Freq: Three times a day (TID) | ORAL | 0 refills | Status: DC | PRN

## 2018-03-02 ENCOUNTER — Other Ambulatory Visit: Payer: Self-pay

## 2018-03-02 ENCOUNTER — Emergency Department (HOSPITAL_COMMUNITY): Payer: Self-pay

## 2018-03-02 ENCOUNTER — Encounter (HOSPITAL_COMMUNITY): Payer: Self-pay | Admitting: *Deleted

## 2018-03-02 DIAGNOSIS — R51 Headache: Secondary | ICD-10-CM | POA: Insufficient documentation

## 2018-03-02 DIAGNOSIS — R110 Nausea: Secondary | ICD-10-CM | POA: Insufficient documentation

## 2018-03-02 DIAGNOSIS — F419 Anxiety disorder, unspecified: Secondary | ICD-10-CM | POA: Insufficient documentation

## 2018-03-02 DIAGNOSIS — R079 Chest pain, unspecified: Secondary | ICD-10-CM | POA: Insufficient documentation

## 2018-03-02 DIAGNOSIS — Z79899 Other long term (current) drug therapy: Secondary | ICD-10-CM | POA: Insufficient documentation

## 2018-03-02 LAB — CBC
HEMATOCRIT: 42.2 % (ref 39.0–52.0)
HEMOGLOBIN: 14.7 g/dL (ref 13.0–17.0)
MCH: 29.2 pg (ref 26.0–34.0)
MCHC: 34.8 g/dL (ref 30.0–36.0)
MCV: 83.7 fL (ref 78.0–100.0)
Platelets: 241 10*3/uL (ref 150–400)
RBC: 5.04 MIL/uL (ref 4.22–5.81)
RDW: 13.4 % (ref 11.5–15.5)
WBC: 5 10*3/uL (ref 4.0–10.5)

## 2018-03-02 LAB — BASIC METABOLIC PANEL
ANION GAP: 10 (ref 5–15)
BUN: 7 mg/dL (ref 6–20)
CHLORIDE: 106 mmol/L (ref 101–111)
CO2: 25 mmol/L (ref 22–32)
Calcium: 9.7 mg/dL (ref 8.9–10.3)
Creatinine, Ser: 1.04 mg/dL (ref 0.61–1.24)
GFR calc Af Amer: >60 mL/min (ref >60)
GFR calc non Af Amer: >60 mL/min (ref >60)
Glucose, Bld: 93 mg/dL (ref 65–99)
POTASSIUM: 3.8 mmol/L (ref 3.5–5.1)
Sodium: 141 mmol/L (ref 135–145)

## 2018-03-02 LAB — I-STAT TROPONIN, ED: Troponin i, poc: 0 ng/mL (ref 0.00–0.08)

## 2018-03-02 NOTE — ED Triage Notes (Signed)
Pt c/o "heart pain x 1 week.  I work out a lot.  I also have a h/a on the right side of my head, my right eye is twitching also.  I've been real nauseated real bad."

## 2018-03-02 NOTE — ED Notes (Signed)
Pt requested repeat b/p.

## 2018-03-03 ENCOUNTER — Emergency Department (HOSPITAL_COMMUNITY)
Admission: EM | Admit: 2018-03-03 | Discharge: 2018-03-03 | Disposition: A | Discharge status: Home / Self Care | Payer: Self-pay | Attending: Emergency Medicine | Admitting: Emergency Medicine

## 2018-03-03 DIAGNOSIS — R079 Chest pain, unspecified: Secondary | ICD-10-CM

## 2018-03-03 DIAGNOSIS — R519 Headache, unspecified: Secondary | ICD-10-CM

## 2018-03-03 DIAGNOSIS — R51 Headache: Secondary | ICD-10-CM

## 2018-03-03 MED ORDER — HYDROXYZINE HCL 25 MG PO TABS: 25.0000 mg | ORAL_TABLET | Freq: Four times a day (QID) | ORAL | 0 refills | Status: DC

## 2018-03-03 MED ORDER — PROCHLORPERAZINE EDISYLATE 10 MG/2ML IJ SOLN
10.0000 mg | Freq: Once | INTRAMUSCULAR | Status: AC
  Administered 2018-03-03: 10 mg via INTRAMUSCULAR
  Filled 2018-03-03: qty 2

## 2018-03-03 MED ORDER — DIPHENHYDRAMINE HCL 25 MG PO CAPS
25.0000 mg | ORAL_CAPSULE | Freq: Once | ORAL | Status: AC
  Administered 2018-03-03: 25 mg via ORAL
  Filled 2018-03-03: qty 1

## 2018-03-03 MED ORDER — DEXAMETHASONE SODIUM PHOSPHATE 10 MG/ML IJ SOLN
10.0000 mg | Freq: Once | INTRAMUSCULAR | Status: AC
  Administered 2018-03-03: 10 mg via INTRAMUSCULAR
  Filled 2018-03-03: qty 1

## 2018-03-03 NOTE — ED Provider Notes (Signed)
[facility]-EMERGENCY DEPT Provider Note   CSN: [id number] Arrival date & time: 03/02/18  1907     History   Chief Complaint Chief Complaint  Patient presents with  . Chest Pain  . Headache    right temporal    HPI Larry Estes is a 27 y.o. male.  Patient presents to the emergency department with chief complaint of chest pain and headache.  He states that he also has anxiety, and feels like he has been under a lot of stress recently.  He states that he took his cousin's Xanax, and the chest pain and headache went away.  He would like something for anxiety.  He denies any fevers, chills, neck stiffness.  He has had migraine headaches in the past, but none in quite some time.  He states the pain is mostly located on the right side of his head.  He reports associated nausea.  He states that he has had no loss of vision or balance.  The history is provided by the patient. No language interpreter was used.    Past Medical History:  Diagnosis Date  . Allergic angiitis (HCC)   . Allergy   . Anxiety   . Headache 12/26/2015  . Multiple allergies   . Sickle cell trait [facility])     Patient Active Problem List   Diagnosis Date Noted  . Headache 12/26/2015  . Myalgia 12/11/2015    History reviewed. No pertinent surgical history.      Home Medications    Prior to Admission medications   Medication Sig Start Date End Date Taking? Authorizing Provider  acetaminophen (TYLENOL) 500 MG tablet Take 1,000 mg by mouth every 6 (six) hours as needed for moderate pain.    [provider]  amoxicillin-clavulanate (AUGMENTIN) 875-125 MG tablet Take 1 tablet by mouth every 12 (twelve) hours. 02/03/18   Roxy Horseman, PA-C  hydrOXYzine (ATARAX/VISTARIL) 25 MG tablet Take 1 tablet (25 mg total) by mouth every 8 (eight) hours as needed for anxiety. Patient not taking: Reported on 02/02/2018 09/20/17   Bethann Berkshire, MD  ibuprofen (ADVIL,MOTRIN) 200 MG tablet Take  600 mg by mouth every 6 (six) hours as needed for fever, headache, mild pain or moderate pain.     [provider]  meclizine (ANTIVERT) 25 MG tablet Take 1 tablet (25 mg total) by mouth 2 (two) times daily as needed for dizziness. Patient not taking: Reported on 02/02/2018 01/09/17   Shaune Pollack, MD  meloxicam (MOBIC) 7.5 MG tablet Take 1 tablet (7.5 mg total) by mouth daily. Patient not taking: Reported on 02/02/2018 04/27/17   Caccavale, Sophia, PA-C  Multiple Vitamin (MULTIVITAMIN WITH MINERALS) TABS tablet Take 1 tablet by mouth daily.    [provider]  omeprazole (PRILOSEC) 20 MG capsule Take 1 capsule (20 mg total) by mouth daily. Patient not taking: Reported on 02/02/2018 02/17/17   Antony Madura, PA-C  ondansetron (ZOFRAN ODT) 4 MG disintegrating tablet Take 1 tablet (4 mg total) by mouth every 8 (eight) hours as needed for nausea or vomiting. 02/03/18   Roxy Horseman, PA-C  ondansetron (ZOFRAN) 4 MG tablet Take 1 tablet (4 mg total) by mouth every 6 (six) hours. Patient not taking: Reported on 02/02/2018 12/24/17   Jaynie Crumble, PA-C  PARoxetine (PAXIL) 20 MG tablet Take 1 tablet (20 mg total) by mouth every morning. Patient not taking: Reported on 02/02/2018 09/20/17   Bethann Berkshire, MD  penicillin v potassium (VEETID) 500 MG tablet Take 1 tablet (  500 mg total) by mouth 3 (three) times daily. Patient not taking: Reported on 02/02/2018 12/24/17   Jaynie Crumble, PA-C  prochlorperazine (COMPAZINE) 10 MG tablet Take 1 tablet (10 mg total) by mouth every 8 (eight) hours as needed for nausea or vomiting. Patient not taking: Reported on 02/02/2018 01/09/17   Shaune Pollack, MD  SUMAtriptan (IMITREX) 50 MG tablet Take 1 tablet (50 mg total) by mouth every 2 (two) hours as needed for migraine. May repeat in 2 hours if headache persists or recurs. Patient not taking: Reported on 02/02/2018 04/27/17   Caccavale, Sophia, PA-C  topiramate (TOPAMAX) 25 MG tablet Take one tablet  at night for one week, then take 2 tablets at night for one week, then take 3 tablets at night. Patient not taking: Reported on 02/02/2018 11/12/16   Trixie Dredge, PA-C    Family History Family History  Problem Relation Age of Onset  . Hypertension Mother   . Hypertension Maternal Grandmother   . Hypertension Paternal Grandmother   . CAD Other   . Hypertension Other   . Diabetes Other     Social History Social History   Tobacco Use  . Smoking status: Never Smoker  . Smokeless tobacco: Never Used  Substance Use Topics  . Alcohol use: Yes    Comment: socially  . Drug use: No     Allergies   Fish allergy; Reglan [metoclopramide]; and Tramadol   Review of Systems Review of Systems  All other systems reviewed and are negative.    Physical Exam Updated Vital Signs BP (!) 138/96 (BP Location: Left Arm)   Pulse 79   Temp 98.6 F (37 C) (Oral)   Resp 14   Ht  (1.702 m)   Wt 90.7 kg (200 lb)   SpO2 99%   BMI 31.32 kg/m   Physical Exam  Constitutional: He is oriented to person, place, and time. He appears well-developed and well-nourished.  HENT:  Head: Normocephalic and atraumatic.  Right Ear: External ear normal.  Left Ear: External ear normal.  Eyes: Pupils are equal, round, and reactive to light. Conjunctivae and EOM are normal.  Neck: Normal range of motion. Neck supple.  No pain with neck flexion, no meningismus  Cardiovascular: Normal rate, regular rhythm and normal heart sounds. Exam reveals no gallop and no friction rub.  No murmur heard. Pulmonary/Chest: Effort normal and breath sounds normal. No respiratory distress. He has no wheezes. He has no rales. He exhibits no tenderness.  Abdominal: Soft. He exhibits no distension and no mass. There is no tenderness. There is no rebound and no guarding.  Musculoskeletal: Normal range of motion. He exhibits no edema or tenderness.  Normal gait.  Neurological: He is alert and oriented to person, place, and  time. He has normal reflexes.  CN 3-12 intact, no ataxia, sensation and strength intact bilaterally.  Skin: Skin is warm and dry.  Psychiatric: He has a normal mood and affect. His behavior is normal. Judgment and thought content normal.  Nursing note and vitals reviewed.    ED Treatments / Results  Labs (all labs ordered are listed, but only abnormal results are displayed) Labs Reviewed  BASIC METABOLIC PANEL  CBC  I-STAT TROPONIN, ED    EKG None ED ECG REPORT  I personally interpreted this EKG   Date: 03/03/2018   Rate: 79  Rhythm: normal sinus rhythm  QRS Axis: normal  Intervals: *  ST/T Wave abnormalities: normal  Conduction Disutrbances:none  Narrative Interpretation:  Old EKG Reviewed: none available  Radiology Dg Chest 2 View  Result Date: 03/02/2018 CLINICAL DATA:  Chest pain EXAM: CHEST - 2 VIEW COMPARISON:  09/20/2017 FINDINGS: The heart size and mediastinal contours are within normal limits. Both lungs are clear. The visualized skeletal structures are unremarkable. IMPRESSION: No active cardiopulmonary disease. Electronically Signed   By: Marlan Palau M.D.   On: 03/02/2018 20:49    Procedures Procedures (including critical care time)  Medications Ordered in ED Medications  dexamethasone (DECADRON) injection 10 mg (has no administration in time range)  prochlorperazine (COMPAZINE) injection 10 mg (has no administration in time range)  diphenhydrAMINE (BENADRYL) capsule 25 mg (has no administration in time range)     Initial Impression / Assessment and Plan / ED Course  I have reviewed the triage vital signs and the nursing notes.  Pertinent labs & imaging results that were available during my care of the patient were reviewed by me and considered in my medical decision making (see chart for details).    Patient with chest pain and headache.  Chest pain has been intermittent.  States that he feels like it is anxiety.  Low risk for ACS.  Troponin  negative.  Reassuring EKG.  PERC negative. Pt HA treated while in ED.  Presentation is like pts typical HA and non concerning for [facility], ICH, Meningitis, or temporal arteritis. Pt is afebrile with no focal neuro deficits, nuchal rigidity, or change in vision. Pt is to follow up with PCP to discuss prophylactic medication. Pt verbalizes understanding and is agreeable with plan to dc.    Final Clinical Impressions(s) / ED Diagnoses   Final diagnoses:  Nonintractable headache, unspecified chronicity pattern, unspecified headache type  Nonspecific chest pain    ED Discharge Orders    None       Roxy Horseman, PA-C 03/03/18 0048    Molpus, Jonny Ruiz, MD 03/03/18 [phone]

## 2018-04-09 ENCOUNTER — Emergency Department
Admission: EM | Admit: 2018-04-09 | Discharge: 2018-04-09 | Disposition: A | Discharge status: Home / Self Care | Payer: Self-pay | Attending: Emergency Medicine | Admitting: Emergency Medicine

## 2018-04-09 ENCOUNTER — Other Ambulatory Visit: Payer: Self-pay

## 2018-04-09 ENCOUNTER — Encounter: Payer: Self-pay | Admitting: Emergency Medicine

## 2018-04-09 ENCOUNTER — Emergency Department: Payer: Self-pay

## 2018-04-09 DIAGNOSIS — J020 Streptococcal pharyngitis: Secondary | ICD-10-CM | POA: Insufficient documentation

## 2018-04-09 DIAGNOSIS — M7918 Myalgia, other site: Secondary | ICD-10-CM | POA: Insufficient documentation

## 2018-04-09 DIAGNOSIS — R002 Palpitations: Secondary | ICD-10-CM | POA: Insufficient documentation

## 2018-04-09 DIAGNOSIS — R079 Chest pain, unspecified: Secondary | ICD-10-CM | POA: Insufficient documentation

## 2018-04-09 DIAGNOSIS — Z79899 Other long term (current) drug therapy: Secondary | ICD-10-CM | POA: Insufficient documentation

## 2018-04-09 DIAGNOSIS — R070 Pain in throat: Secondary | ICD-10-CM | POA: Insufficient documentation

## 2018-04-09 LAB — COMPREHENSIVE METABOLIC PANEL
ALK PHOS: 41 U/L (ref 38–126)
ALT: 29 U/L (ref 0–44)
AST: 44 U/L — AB (ref 15–41)
Albumin: 4.3 g/dL (ref 3.5–5.0)
Anion gap: 10 (ref 5–15)
BUN: 9 mg/dL (ref 6–20)
CALCIUM: 8.8 mg/dL — AB (ref 8.9–10.3)
CHLORIDE: 103 mmol/L (ref 98–111)
CO2: 26 mmol/L (ref 22–32)
CREATININE: 1.12 mg/dL (ref 0.61–1.24)
GFR calc non Af Amer: >60 mL/min (ref >60)
GLUCOSE: 100 mg/dL — AB (ref 70–99)
Potassium: 3.2 mmol/L — ABNORMAL LOW (ref 3.5–5.1)
SODIUM: 139 mmol/L (ref 135–145)
Total Bilirubin: 1.4 mg/dL — ABNORMAL HIGH (ref 0.3–1.2)
Total Protein: 7.5 g/dL (ref 6.5–8.1)

## 2018-04-09 LAB — CBC WITH DIFFERENTIAL/PLATELET
Basophils Absolute: 0 10*3/uL (ref 0–0.1)
Basophils Relative: 0 %
EOS PCT: 1 %
Eosinophils Absolute: 0.1 10*3/uL (ref 0–0.7)
HCT: 41.8 % (ref 40.0–52.0)
Hemoglobin: 14.1 g/dL (ref 13.0–18.0)
LYMPHS ABS: 0.6 10*3/uL — AB (ref 1.0–3.6)
LYMPHS PCT: 5 %
MCH: 28.7 pg (ref 26.0–34.0)
MCHC: 33.7 g/dL (ref 32.0–36.0)
MCV: 85.1 fL (ref 80.0–100.0)
MONOS PCT: 10 %
Monocytes Absolute: 1.2 10*3/uL — ABNORMAL HIGH (ref 0.2–1.0)
Neutro Abs: 9.6 10*3/uL — ABNORMAL HIGH (ref 1.4–6.5)
Neutrophils Relative %: 84 %
PLATELETS: 209 10*3/uL (ref 150–440)
RBC: 4.91 MIL/uL (ref 4.40–5.90)
RDW: 14.3 % (ref 11.5–14.5)
WBC: 11.5 10*3/uL — AB (ref 3.8–10.6)

## 2018-04-09 LAB — GROUP A STREP BY PCR: GROUP A STREP BY PCR: DETECTED — AB

## 2018-04-09 LAB — TROPONIN I: Troponin I: <0.03 ng/mL (ref <0.03)

## 2018-04-09 MED ORDER — ASPIRIN 81 MG PO CHEW
324.0000 mg | CHEWABLE_TABLET | Freq: Once | ORAL | Status: AC
  Administered 2018-04-09: 324 mg via ORAL
  Filled 2018-04-09: qty 4

## 2018-04-09 MED ORDER — AMOXICILLIN 500 MG PO TABS: 500.0000 mg | ORAL_TABLET | Freq: Three times a day (TID) | ORAL | 0 refills | Status: DC

## 2018-04-09 MED ORDER — NAPROXEN 500 MG PO TABS: 500.0000 mg | ORAL_TABLET | Freq: Two times a day (BID) | ORAL | 0 refills | Status: DC

## 2018-04-09 MED ORDER — NAPROXEN 500 MG PO TABS
500.0000 mg | ORAL_TABLET | Freq: Once | ORAL | Status: AC
  Administered 2018-04-09: 500 mg via ORAL
  Filled 2018-04-09: qty 1

## 2018-04-09 NOTE — ED Provider Notes (Signed)
Lurline IdolI, Alexzandria Massman, attending physician, personally viewed and interpreted this EKG  EKG Time: 1723 Rate: 86 Rhythm: normal sinus rhythm Axis: normal Intervals: qtc 397 QRS: narrow ST changes: no st elevation Impression: normal ekg    Phineas SemenGoodman, Heavenleigh Petruzzi, MD 04/09/18 1727

## 2018-04-09 NOTE — Discharge Instructions (Signed)
Please follow up with your cardiologist. Call Monday morning for an appointment. Take all of the antibiotic until finished. Return to the ER for symptoms that change or worsen or for new concerns.

## 2018-04-09 NOTE — ED Provider Notes (Signed)
[facility] Emergency Department Provider Note  ____________________________________________   First MD Initiated Contact with Patient 04/09/18 1641     (approximate)  I have reviewed the triage vital signs and the nursing notes.   HISTORY  Chief Complaint Fever; Generalized Body Aches; and Sore Throat   HPI Larry Estes is a 27 y.o. male who presents to the emergency department for treatment and evaluation of fever, body aches, and sore throat.  Also, patient states that he has had chest pain and palpitations for the past 2 days.  He states that he has had similar symptoms with anxiety, but has not felt anxious or panicky over the last couple of days.  No alleviating measures have been attempted prior to arrival.  Past Medical History:  Diagnosis Date  . Allergic angiitis (HCC)   . Allergy   . Anxiety   . Headache 12/26/2015  . Multiple allergies   . Sickle cell trait [facility](HCC)     Patient Active Problem List   Diagnosis Date Noted  . Headache 12/26/2015  . Myalgia 12/11/2015    History reviewed. No pertinent surgical history.  Prior to Admission medications   Medication Sig Start Date End Date Taking? Authorizing Provider  acetaminophen (TYLENOL) 500 MG tablet Take 1,000 mg by mouth every 6 (six) hours as needed for moderate pain.    [provider]  amoxicillin (AMOXIL) 500 MG tablet Take 1 tablet (500 mg total) by mouth 3 (three) times daily. 04/09/18   Afia Messenger, Rulon Eisenmengerari B, FNP  amoxicillin-clavulanate (AUGMENTIN) 875-125 MG tablet Take 1 tablet by mouth every 12 (twelve) hours. 02/03/18   Roxy HorsemanBrowning, Robert, PA-C  hydrOXYzine (ATARAX/VISTARIL) 25 MG tablet Take 1 tablet (25 mg total) by mouth every 6 (six) hours. 03/03/18   Roxy HorsemanBrowning, Robert, PA-C  ibuprofen (ADVIL,MOTRIN) 200 MG tablet Take 600 mg by mouth every 6 (six) hours as needed for fever, headache, mild pain or moderate pain.     [provider]  meclizine (ANTIVERT) 25 MG tablet  Take 1 tablet (25 mg total) by mouth 2 (two) times daily as needed for dizziness. Patient not taking: Reported on 02/02/2018 01/09/17   Shaune PollackIsaacs, Cameron, MD  meloxicam (MOBIC) 7.5 MG tablet Take 1 tablet (7.5 mg total) by mouth daily. Patient not taking: Reported on 02/02/2018 04/27/17   Caccavale, Sophia, PA-C  Multiple Vitamin (MULTIVITAMIN WITH MINERALS) TABS tablet Take 1 tablet by mouth daily.    [provider]  naproxen (NAPROSYN) 500 MG tablet Take 1 tablet (500 mg total) by mouth 2 (two) times daily with a meal. 04/09/18   Chloe Flis B, FNP  omeprazole (PRILOSEC) 20 MG capsule Take 1 capsule (20 mg total) by mouth daily. Patient not taking: Reported on 02/02/2018 02/17/17   Antony MaduraHumes, Kelly, PA-C  ondansetron (ZOFRAN ODT) 4 MG disintegrating tablet Take 1 tablet (4 mg total) by mouth every 8 (eight) hours as needed for nausea or vomiting. 02/03/18   Roxy HorsemanBrowning, Robert, PA-C  ondansetron (ZOFRAN) 4 MG tablet Take 1 tablet (4 mg total) by mouth every 6 (six) hours. Patient not taking: Reported on 02/02/2018 12/24/17   Jaynie CrumbleKirichenko, Tatyana, PA-C  PARoxetine (PAXIL) 20 MG tablet Take 1 tablet (20 mg total) by mouth every morning. Patient not taking: Reported on 02/02/2018 09/20/17   Bethann BerkshireZammit, Joseph, MD  penicillin v potassium (VEETID) 500 MG tablet Take 1 tablet (500 mg total) by mouth 3 (three) times daily. Patient not taking: Reported on 02/02/2018 12/24/17   Jaynie CrumbleKirichenko, Tatyana, PA-C  prochlorperazine (  COMPAZINE) 10 MG tablet Take 1 tablet (10 mg total) by mouth every 8 (eight) hours as needed for nausea or vomiting. Patient not taking: Reported on 02/02/2018 01/09/17   Shaune Pollack, MD  SUMAtriptan (IMITREX) 50 MG tablet Take 1 tablet (50 mg total) by mouth every 2 (two) hours as needed for migraine. May repeat in 2 hours if headache persists or recurs. Patient not taking: Reported on 02/02/2018 04/27/17   Caccavale, Sophia, PA-C  topiramate (TOPAMAX) 25 MG tablet Take one tablet at night for one week,  then take 2 tablets at night for one week, then take 3 tablets at night. Patient not taking: Reported on 02/02/2018 11/12/16   Trixie Dredge, PA-C    Allergies Fish allergy; Reglan [metoclopramide]; and Tramadol  Family History  Problem Relation Age of Onset  . Hypertension Mother   . Hypertension Maternal Grandmother   . Hypertension Paternal Grandmother   . CAD Other   . Hypertension Other   . Diabetes Other     Social History Social History   Tobacco Use  . Smoking status: Never Smoker  . Smokeless tobacco: Never Used  Substance Use Topics  . Alcohol use: Yes    Comment: socially  . Drug use: No    Review of Systems  Constitutional: No fever.  Positive for chills Eyes: No visual changes. ENT: Positive for sore throat. Cardiovascular: Positive for chest pain. Respiratory: Denies shortness of breath. Gastrointestinal: No abdominal pain.  No nausea, no vomiting.  No diarrhea.  No constipation. Genitourinary: Negative for dysuria. Musculoskeletal: Negative for back pain. Skin: Negative for rash. Neurological: Negative for headaches, focal weakness or numbness. ____________________________________________   PHYSICAL EXAM:  VITAL SIGNS: ED Triage Vitals  Enc Vitals Group     BP 04/09/18 1602 117/70     Pulse Rate 04/09/18 1602 86     Resp 04/09/18 1602 16     Temp 04/09/18 1602 98.5 F (36.9 C)     Temp Source 04/09/18 1602 Oral     SpO2 04/09/18 1602 99 %     Weight 04/09/18 1603 200 lb (90.7 kg)     Height 04/09/18 1603 5\' 7"  (1.702 m)     Head Circumference --      Peak Flow --      Pain Score 04/09/18 1603 10     Pain Loc --      Pain Edu? --      Excl. in GC? --     Constitutional: Alert and oriented. Well appearing and in no acute distress. Eyes: Conjunctivae are normal. Head: Atraumatic. Nose: No congestion/rhinnorhea. Mouth/Throat: Mucous membranes are moist.  Oropharynx erythematous.  Tonsils 2+ with exudate.   Neck: No stridor.     Cardiovascular: Normal rate, regular rhythm. Grossly normal heart sounds.  Good peripheral circulation. Respiratory: Normal respiratory effort.  No retractions. Lungs CTAB. Gastrointestinal: Soft and nontender. No distention. No abdominal bruits. No CVA tenderness. Musculoskeletal: No lower extremity tenderness nor edema.  No joint effusions. Neurologic:  Normal speech and language. No gross focal neurologic deficits are appreciated. No gait instability. Skin:  Skin is warm, dry and intact. No rash noted. Psychiatric: Mood and affect are normal. Speech and behavior are normal.  ____________________________________________   LABS (all labs ordered are listed, but only abnormal results are displayed)  Labs Reviewed  GROUP A STREP BY PCR - Abnormal; Notable for the following components:      Result Value   Group A Strep by PCR DETECTED (*)  All other components within normal limits  COMPREHENSIVE METABOLIC PANEL - Abnormal; Notable for the following components:   Potassium 3.2 (*)    Glucose, Bld 100 (*)    Calcium 8.8 (*)    AST 44 (*)    Total Bilirubin 1.4 (*)    All other components within normal limits  CBC WITH DIFFERENTIAL/PLATELET - Abnormal; Notable for the following components:   WBC 11.5 (*)    Neutro Abs 9.6 (*)    Lymphs Abs 0.6 (*)    Monocytes Absolute 1.2 (*)    All other components within normal limits  TROPONIN I   ____________________________________________  EKG  Normal EKG Ventricular rate of 86, normal sinus rhythm, normal axis, QTC is 397, QRS is narrow and there is no ST elevation ____________________________________________  RADIOLOGY  ED MD interpretation: Chest x-ray negative for acute cardiopulmonary abnormality.  Official radiology report(s): Dg Chest 2 View  Result Date: 04/09/2018 CLINICAL DATA:  Acute chest pain. EXAM: CHEST - 2 VIEW COMPARISON:  03/02/2018 and prior radiographs FINDINGS: The cardiomediastinal silhouette is unremarkable.  Mild peribronchial thickening is unchanged. There is no evidence of focal airspace disease, pulmonary edema, suspicious pulmonary nodule/mass, pleural effusion, or pneumothorax. No acute bony abnormalities are identified. IMPRESSION: No acute cardiopulmonary disease. Electronically Signed   By: Harmon Pier M.D.   On: 04/09/2018 18:37    ____________________________________________   PROCEDURES  Procedure(s) performed: None  Procedures  Critical Care performed: No  ____________________________________________   INITIAL IMPRESSION / ASSESSMENT AND PLAN / ED COURSE  As part of my medical decision making, I reviewed the following data within the electronic MEDICAL RECORD NUMBER Notes from prior ED visits   27 year old male presenting to the emergency department for treatment and evaluation of sore throat and chest pain.  He denies calf pain or leg swelling, syncope, or hemoptysis.  He does not have any significant cardiac risk factors including diabetes, hypertension, or hyperlipidemia.  He has had previous cardiac echoes that have been normal.  The last study that I see was 10/01/2015 showed an ejection fraction of 60 to 65%. He had presented to the ER in May of 2019 for evaluation of chest pain which resulted in no specific findings to explain his symptoms. He has not followed up with cardiology since that visit.  Patient discharged home after labs, EKG, and chest x-ray are all reassuring.  Patient was counseled regarding appropriate follow-up.  He has had multiple ER visits for evaluation of chest pain.  Pain is most likely secondary to anxiety and he was encouraged to schedule an appointment with [facility], [facility], or [facility] for further evaluation.  He was also advised that he should follow-up with cardiology as previously advised.  For his presenting complaint of sore throat and headache, he will receive a prescription for amoxicillin and Naprosyn.      ____________________________________________   FINAL CLINICAL IMPRESSION(S) / ED DIAGNOSES  Final diagnoses:  Nonspecific chest pain  Strep pharyngitis     ED Discharge Orders        Ordered    amoxicillin (AMOXIL) 500 MG tablet  3 times daily     04/09/18 1849    naproxen (NAPROSYN) 500 MG tablet  2 times daily with meals     04/09/18 1849       Note:  This document was prepared using Dragon voice recognition software and may include unintentional dictation errors.    Chinita Pester, FNP 04/09/18 1931    Derrill Kay,  Dustin Flock, MD 04/09/18 2002

## 2018-04-09 NOTE — ED Triage Notes (Signed)
Pt comes into the ED via POV c/o headache, fever, body aches, and sore throat.  Patient states his 27 year old has also been sick at home.  Patient is afebrile at this time.  Patient still eating and drinking at this time.

## 2018-04-09 NOTE — ED Notes (Signed)
Pt discharged home after verbalizing understanding of discharge instructions; nad noted.  Pt given number for [facility] for counseling for "personal matter."

## 2018-04-09 NOTE — ED Notes (Signed)
Pt given phone to make call.  

## 2018-04-29 ENCOUNTER — Emergency Department (HOSPITAL_COMMUNITY): Payer: Self-pay

## 2018-04-29 ENCOUNTER — Emergency Department (HOSPITAL_COMMUNITY)
Admission: EM | Admit: 2018-04-29 | Discharge: 2018-04-29 | Disposition: A | Discharge status: Home / Self Care | Payer: Self-pay | Attending: Emergency Medicine | Admitting: Emergency Medicine

## 2018-04-29 ENCOUNTER — Encounter (HOSPITAL_COMMUNITY): Payer: Self-pay

## 2018-04-29 DIAGNOSIS — K029 Dental caries, unspecified: Secondary | ICD-10-CM | POA: Insufficient documentation

## 2018-04-29 DIAGNOSIS — Z79899 Other long term (current) drug therapy: Secondary | ICD-10-CM | POA: Insufficient documentation

## 2018-04-29 DIAGNOSIS — J020 Streptococcal pharyngitis: Secondary | ICD-10-CM | POA: Insufficient documentation

## 2018-04-29 DIAGNOSIS — K0889 Other specified disorders of teeth and supporting structures: Secondary | ICD-10-CM

## 2018-04-29 LAB — GROUP A STREP BY PCR: GROUP A STREP BY PCR: DETECTED — AB

## 2018-04-29 MED ORDER — BUPIVACAINE-EPINEPHRINE 0.25% -1:200000 IJ SOLN
10.0000 mL | Freq: Once | INTRAMUSCULAR | Status: DC
  Filled 2018-04-29: qty 10

## 2018-04-29 MED ORDER — PENICILLIN G BENZATHINE 1200000 UNIT/2ML IM SUSP
1.2000 10*6.[IU] | Freq: Once | INTRAMUSCULAR | Status: AC
  Administered 2018-04-29: 1.2 10*6.[IU] via INTRAMUSCULAR
  Filled 2018-04-29: qty 2

## 2018-04-29 MED ORDER — OXYCODONE-ACETAMINOPHEN 5-325 MG PO TABS
1.0000 | ORAL_TABLET | Freq: Once | ORAL | Status: AC
  Administered 2018-04-29: 1 via ORAL
  Filled 2018-04-29: qty 1

## 2018-04-29 NOTE — ED Triage Notes (Signed)
Pt states that he has been having tooth pain on R lower side, broken tooth. Pt also reports coughing up brown mucous for the past four days with sore throat and chills.

## 2018-04-29 NOTE — ED Provider Notes (Signed)
[facility] EMERGENCY DEPARTMENT Provider Note   CSN: [id number] Arrival date & time: 04/29/18  0102     History   Chief Complaint Chief Complaint  Patient presents with  . Dental Pain  . Sore Throat    HPI Larry Estes is a 27 y.o. male.  Patient here with persistent symptoms of sore throat since diagnosis of Strep throat on 04/09/18. He was not able to complete the medication regimen secondary to losing the medication. He reports new, developing symptoms of cough, ear pain, congestion, and chest tightness. No vomiting, rash, headache. He reports dental pain as well in the lower right molars. No facial swelling. No definite fever.   The history is provided by the patient. No language interpreter was used.  Dental Pain    Sore Throat  Pertinent negatives include no chest pain and no headaches.    Past Medical History:  Diagnosis Date  . Allergic angiitis (HCC)   . Allergy   . Anxiety   . Headache 12/26/2015  . Multiple allergies   . Sickle cell trait [facility](HCC)     Patient Active Problem List   Diagnosis Date Noted  . Headache 12/26/2015  . Myalgia 12/11/2015    History reviewed. No pertinent surgical history.      Home Medications    Prior to Admission medications   Medication Sig Start Date End Date Taking? Authorizing Provider  acetaminophen (TYLENOL) 500 MG tablet Take 1,000 mg by mouth every 6 (six) hours as needed for moderate pain.    [provider]  amoxicillin (AMOXIL) 500 MG tablet Take 1 tablet (500 mg total) by mouth 3 (three) times daily. 04/09/18   Triplett, Rulon Eisenmengerari B, FNP  amoxicillin-clavulanate (AUGMENTIN) 875-125 MG tablet Take 1 tablet by mouth every 12 (twelve) hours. 02/03/18   Roxy HorsemanBrowning, Robert, PA-C  hydrOXYzine (ATARAX/VISTARIL) 25 MG tablet Take 1 tablet (25 mg total) by mouth every 6 (six) hours. 03/03/18   Roxy HorsemanBrowning, Robert, PA-C  ibuprofen (ADVIL,MOTRIN) 200 MG tablet Take 600 mg by mouth every 6 (six) hours as needed  for fever, headache, mild pain or moderate pain.     [provider]  meclizine (ANTIVERT) 25 MG tablet Take 1 tablet (25 mg total) by mouth 2 (two) times daily as needed for dizziness. Patient not taking: Reported on 02/02/2018 01/09/17   Shaune PollackIsaacs, Cameron, MD  meloxicam (MOBIC) 7.5 MG tablet Take 1 tablet (7.5 mg total) by mouth daily. Patient not taking: Reported on 02/02/2018 04/27/17   Caccavale, Sophia, PA-C  Multiple Vitamin (MULTIVITAMIN WITH MINERALS) TABS tablet Take 1 tablet by mouth daily.    [provider]  naproxen (NAPROSYN) 500 MG tablet Take 1 tablet (500 mg total) by mouth 2 (two) times daily with a meal. 04/09/18   Triplett, Cari B, FNP  omeprazole (PRILOSEC) 20 MG capsule Take 1 capsule (20 mg total) by mouth daily. Patient not taking: Reported on 02/02/2018 02/17/17   Antony MaduraHumes, Kelly, PA-C  ondansetron (ZOFRAN ODT) 4 MG disintegrating tablet Take 1 tablet (4 mg total) by mouth every 8 (eight) hours as needed for nausea or vomiting. 02/03/18   Roxy HorsemanBrowning, Robert, PA-C  ondansetron (ZOFRAN) 4 MG tablet Take 1 tablet (4 mg total) by mouth every 6 (six) hours. Patient not taking: Reported on 02/02/2018 12/24/17   Jaynie CrumbleKirichenko, Tatyana, PA-C  PARoxetine (PAXIL) 20 MG tablet Take 1 tablet (20 mg total) by mouth every morning. Patient not taking: Reported on 02/02/2018 09/20/17   Bethann BerkshireZammit, Joseph, MD  penicillin v  potassium (VEETID) 500 MG tablet Take 1 tablet (500 mg total) by mouth 3 (three) times daily. Patient not taking: Reported on 02/02/2018 12/24/17   Jaynie Crumble, PA-C  prochlorperazine (COMPAZINE) 10 MG tablet Take 1 tablet (10 mg total) by mouth every 8 (eight) hours as needed for nausea or vomiting. Patient not taking: Reported on 02/02/2018 01/09/17   Shaune Pollack, MD  SUMAtriptan (IMITREX) 50 MG tablet Take 1 tablet (50 mg total) by mouth every 2 (two) hours as needed for migraine. May repeat in 2 hours if headache persists or recurs. Patient not taking: Reported on  02/02/2018 04/27/17   Caccavale, Sophia, PA-C  topiramate (TOPAMAX) 25 MG tablet Take one tablet at night for one week, then take 2 tablets at night for one week, then take 3 tablets at night. Patient not taking: Reported on 02/02/2018 11/12/16   Trixie Dredge, PA-C    Family History Family History  Problem Relation Age of Onset  . Hypertension Mother   . Hypertension Maternal Grandmother   . Hypertension Paternal Grandmother   . CAD Other   . Hypertension Other   . Diabetes Other     Social History Social History   Tobacco Use  . Smoking status: Never Smoker  . Smokeless tobacco: Never Used  Substance Use Topics  . Alcohol use: Yes    Comment: socially  . Drug use: No     Allergies   Fish allergy; Reglan [metoclopramide]; and Tramadol   Review of Systems Review of Systems  Constitutional: Negative for chills and fever.  HENT: Positive for congestion, dental problem and sore throat. Negative for trouble swallowing.   Respiratory: Positive for cough and chest tightness.   Cardiovascular: Negative.  Negative for chest pain.  Gastrointestinal: Negative.  Negative for nausea.  Musculoskeletal: Negative.  Negative for myalgias and neck stiffness.  Skin: Negative.  Negative for rash.  Neurological: Negative.  Negative for headaches.     Physical Exam Updated Vital Signs BP 115/71 (BP Location: Right Arm)   Pulse 63   Temp 98.5 F (36.9 C) (Oral)   Resp 18   SpO2 98%   Physical Exam  Constitutional: He is oriented to person, place, and time. He appears well-developed and well-nourished.  HENT:  Head: Normocephalic.  Right Ear: Tympanic membrane normal.  Left Ear: Tympanic membrane normal.  Mouth/Throat: Uvula is midline and mucous membranes are normal. Mucous membranes are not dry. Posterior oropharyngeal edema and posterior oropharyngeal erythema present. No oropharyngeal exudate.  Generally good dentition. There is a significant caries of #29 and tenderness of #30.  No visualized abscess.   Neck: Normal range of motion. Neck supple.  Cardiovascular: Normal rate and regular rhythm.  Pulmonary/Chest: Effort normal and breath sounds normal. He has no wheezes. He has no rales.  Abdominal: Soft. Bowel sounds are normal. There is no tenderness. There is no rebound and no guarding.  Musculoskeletal: Normal range of motion.  Neurological: He is alert and oriented to person, place, and time.  Skin: Skin is warm and dry. No rash noted.  Psychiatric: He has a normal mood and affect.     ED Treatments / Results  Labs (all labs ordered are listed, but only abnormal results are displayed) Labs Reviewed  GROUP A STREP BY PCR - Abnormal; Notable for the following components:      Result Value   Group A Strep by PCR DETECTED (*)    All other components within normal limits    EKG None  Radiology  No results found.  Procedures Procedures (including critical care time)  Medications Ordered in ED Medications  penicillin g benzathine (BICILLIN LA) 1200000 UNIT/2ML injection 1.2 Million Units (has no administration in time range)  bupivacaine-EPINEPHrine (MARCAINE W/ EPI) 0.25% -1:200000 (with pres) injection 10 mL (has no administration in time range)     Initial Impression / Assessment and Plan / ED Course  I have reviewed the triage vital signs and the nursing notes.  Pertinent labs & imaging results that were available during my care of the patient were reviewed by me and considered in my medical decision making (see chart for details).     Patient here with persistent sore throat and developing URI symptoms. Also complains of tooth pain  He is well appearing. In NAD. VSS. Strep test is again positive.   He is offered a dental block for dental pain, which he accepts. IM bicillin provided as well to insure compliance. Encouraged to continue ibuprofen and tylenol outpatient and follow up for dental care.   Final Clinical Impressions(s) / ED  Diagnoses   Final diagnoses:  None   1. Strep 2. Medication noncompliance 3. Dental pain/caries  ED Discharge Orders    None       Elpidio Anis, Cordelia Poche 04/29/18 0448    Dione Booze, MD 04/29/18 [phone]

## 2018-04-29 NOTE — ED Notes (Signed)
Pt is requesting to speak with MD about x-rays bc he has had 3 x-rays in the past 45 days and in worried about getting cancer.

## 2018-04-29 NOTE — ED Notes (Signed)
Patient verbalizes understanding of medications and discharge instructions. No further questions at this time. VSS and patient ambulatory at discharge.   

## 2018-05-09 ENCOUNTER — Encounter: Payer: Self-pay | Admitting: Emergency Medicine

## 2018-05-09 ENCOUNTER — Emergency Department
Admission: EM | Admit: 2018-05-09 | Discharge: 2018-05-09 | Disposition: A | Discharge status: Home / Self Care | Payer: Self-pay | Attending: Emergency Medicine | Admitting: Emergency Medicine

## 2018-05-09 ENCOUNTER — Other Ambulatory Visit: Payer: Self-pay

## 2018-05-09 DIAGNOSIS — Z79899 Other long term (current) drug therapy: Secondary | ICD-10-CM | POA: Insufficient documentation

## 2018-05-09 DIAGNOSIS — B356 Tinea cruris: Secondary | ICD-10-CM | POA: Insufficient documentation

## 2018-05-09 MED ORDER — DIPHENHYDRAMINE HCL 25 MG PO CAPS
50.0000 mg | ORAL_CAPSULE | Freq: Once | ORAL | Status: AC
  Administered 2018-05-09: 50 mg via ORAL
  Filled 2018-05-09: qty 2

## 2018-05-09 MED ORDER — TERBINAFINE HCL 1 % EX CREA: 1.0000 "application " | TOPICAL_CREAM | Freq: Two times a day (BID) | CUTANEOUS | 1 refills | Status: DC

## 2018-05-09 NOTE — ED Triage Notes (Signed)
Rash / irritation to groin x 6-7 weeks.

## 2018-05-09 NOTE — ED Provider Notes (Signed)
[facility] Emergency Department Provider Note  ____________________________________________   First MD Initiated Contact with Patient 05/09/18 [phone]     (approximate)  I have reviewed the triage vital signs and the nursing notes.   HISTORY  Chief Complaint Rash  HPI Azaria Jimmey Ralpharker is a 27 y.o. male is here with complaint of rash and irritation in the groin area for 6 to 8 weeks.  Patient has not used any over-the-counter medication to this area.  He states area is itching more today than usual.  He denies any pain.   Past Medical History:  Diagnosis Date  . Allergic angiitis (HCC)   . Allergy   . Anxiety   . Headache 12/26/2015  . Multiple allergies   . Sickle cell trait [facility](HCC)     Patient Active Problem List   Diagnosis Date Noted  . Headache 12/26/2015  . Myalgia 12/11/2015    History reviewed. No pertinent surgical history.  Prior to Admission medications   Medication Sig Start Date End Date Taking? Authorizing Provider  acetaminophen (TYLENOL) 500 MG tablet Take 1,000 mg by mouth every 6 (six) hours as needed for moderate pain.    [provider]  amoxicillin (AMOXIL) 500 MG tablet Take 1 tablet (500 mg total) by mouth 3 (three) times daily. 04/09/18   Triplett, Rulon Eisenmengerari B, FNP  amoxicillin-clavulanate (AUGMENTIN) 875-125 MG tablet Take 1 tablet by mouth every 12 (twelve) hours. 02/03/18   Roxy HorsemanBrowning, Robert, PA-C  hydrOXYzine (ATARAX/VISTARIL) 25 MG tablet Take 1 tablet (25 mg total) by mouth every 6 (six) hours. 03/03/18   Roxy HorsemanBrowning, Robert, PA-C  ibuprofen (ADVIL,MOTRIN) 200 MG tablet Take 600 mg by mouth every 6 (six) hours as needed for fever, headache, mild pain or moderate pain.     [provider]  meclizine (ANTIVERT) 25 MG tablet Take 1 tablet (25 mg total) by mouth 2 (two) times daily as needed for dizziness. Patient not taking: Reported on 02/02/2018 01/09/17   Shaune PollackIsaacs, Cameron, MD  meloxicam (MOBIC) 7.5 MG tablet Take 1 tablet  (7.5 mg total) by mouth daily. Patient not taking: Reported on 02/02/2018 04/27/17   Caccavale, Sophia, PA-C  Multiple Vitamin (MULTIVITAMIN WITH MINERALS) TABS tablet Take 1 tablet by mouth daily.    [provider]  naproxen (NAPROSYN) 500 MG tablet Take 1 tablet (500 mg total) by mouth 2 (two) times daily with a meal. 04/09/18   Triplett, Cari B, FNP  omeprazole (PRILOSEC) 20 MG capsule Take 1 capsule (20 mg total) by mouth daily. Patient not taking: Reported on 02/02/2018 02/17/17   Antony MaduraHumes, Kelly, PA-C  ondansetron (ZOFRAN ODT) 4 MG disintegrating tablet Take 1 tablet (4 mg total) by mouth every 8 (eight) hours as needed for nausea or vomiting. 02/03/18   Roxy HorsemanBrowning, Robert, PA-C  ondansetron (ZOFRAN) 4 MG tablet Take 1 tablet (4 mg total) by mouth every 6 (six) hours. Patient not taking: Reported on 02/02/2018 12/24/17   Jaynie CrumbleKirichenko, Tatyana, PA-C  PARoxetine (PAXIL) 20 MG tablet Take 1 tablet (20 mg total) by mouth every morning. Patient not taking: Reported on 02/02/2018 09/20/17   Bethann BerkshireZammit, Joseph, MD  penicillin v potassium (VEETID) 500 MG tablet Take 1 tablet (500 mg total) by mouth 3 (three) times daily. Patient not taking: Reported on 02/02/2018 12/24/17   Jaynie CrumbleKirichenko, Tatyana, PA-C  prochlorperazine (COMPAZINE) 10 MG tablet Take 1 tablet (10 mg total) by mouth every 8 (eight) hours as needed for nausea or vomiting. Patient not taking: Reported on 02/02/2018 01/09/17  Shaune Pollack, MD  SUMAtriptan (IMITREX) 50 MG tablet Take 1 tablet (50 mg total) by mouth every 2 (two) hours as needed for migraine. May repeat in 2 hours if headache persists or recurs. Patient not taking: Reported on 02/02/2018 04/27/17   Caccavale, Sophia, PA-C  terbinafine (LAMISIL) 1 % cream Apply 1 application topically 2 (two) times daily. 05/09/18   Tommi Rumps, PA-C  topiramate (TOPAMAX) 25 MG tablet Take one tablet at night for one week, then take 2 tablets at night for one week, then take 3 tablets at night. Patient  not taking: Reported on 02/02/2018 11/12/16   Trixie Dredge, PA-C    Allergies Fish allergy; Reglan [metoclopramide]; and Tramadol  Family History  Problem Relation Age of Onset  . Hypertension Mother   . Hypertension Maternal Grandmother   . Hypertension Paternal Grandmother   . CAD Other   . Hypertension Other   . Diabetes Other     Social History Social History   Tobacco Use  . Smoking status: Never Smoker  . Smokeless tobacco: Never Used  Substance Use Topics  . Alcohol use: Yes    Comment: socially  . Drug use: No    Review of Systems Constitutional: No fever/chills Cardiovascular: Denies chest pain. Respiratory: Denies shortness of breath. Genitourinary: Negative for dysuria. Skin: Positive for rash. Neurological: Negative for headaches, focal weakness or numbness. ___________________________________________   PHYSICAL EXAM:  VITAL SIGNS: ED Triage Vitals  Enc Vitals Group     BP 05/09/18 0720 131/78     Pulse Rate 05/09/18 0720 (!) 56     Resp 05/09/18 0720 16     Temp 05/09/18 0720 (!) 97.5 F (36.4 C)     Temp Source 05/09/18 0720 Oral     SpO2 05/09/18 0720 100 %     Weight 05/09/18 0719 200 lb (90.7 kg)     Height 05/09/18 0719 5\' 7"  (1.702 m)     Head Circumference --      Peak Flow --      Pain Score 05/09/18 0719 0     Pain Loc --      Pain Edu? --      Excl. in GC? --    Constitutional: Alert and oriented. Well appearing and in no acute distress. Eyes: Conjunctivae are normal.  Head: Atraumatic. Neck: No stridor.   Cardiovascular: Normal rate, regular rhythm. Grossly normal heart sounds.  Good peripheral circulation. Respiratory: Normal respiratory effort.  No retractions. Lungs CTAB. Gastrointestinal: Soft and nontender. No distention. Musculoskeletal: Moves upper and lower extremities without any difficulty.  Normal gait was noted. Neurologic:  Normal speech and language. No gross focal neurologic deficits are appreciated.  Skin:  Skin  is warm, dry.  There is a rash present in the genital area and including the medial aspect bilateral thighs.  Area has discrete edges.  No erythema or drainage is noted.  There is no tenderness to palpation. Psychiatric: Mood and affect are normal. Speech and behavior are normal.  ____________________________________________   LABS (all labs ordered are listed, but only abnormal results are displayed)  Labs Reviewed - No data to display  PROCEDURES  Procedure(s) performed: None  Procedures  Critical Care performed: No  ____________________________________________   INITIAL IMPRESSION / ASSESSMENT AND PLAN / ED COURSE  As part of my medical decision making, I reviewed the following data within the electronic MEDICAL RECORD NUMBER Notes from prior ED visits and  Controlled Substance Database  Patient was made aware that  this is known as "jock itch".  Patient was given a prescription for Lamisil cream to apply to area twice daily.  He also was made aware that where the medication will help with itching that it may take up to 2 to 3 weeks for him to see complete resolution of his rash.  He is encouraged to keep the area dry.  He is to follow-up with his PCP if any continued problems.  ____________________________________________   FINAL CLINICAL IMPRESSION(S) / ED DIAGNOSES  Final diagnoses:  Tinea cruris     ED Discharge Orders        Ordered    terbinafine (LAMISIL) 1 % cream  2 times daily     05/09/18 0736       Note:  This document was prepared using Dragon voice recognition software and may include unintentional dictation errors.    Tommi Rumps, PA-C 05/09/18 1040    Sharyn Creamer, MD 05/09/18 [phone]

## 2018-05-09 NOTE — Discharge Instructions (Addendum)
[place] if any continued problems.  Begin using cream twice a day every day.  Keep area clean and dry.  Make sure areas dry after bathing.  Change close frequently if sweating. Do not cut fingernails of that sort.  Soak your finger in warm salt water twice a day.

## 2018-05-09 NOTE — ED Notes (Signed)
Pt c/o itching and burning rash to groin area for several weeks, states he has been using antibacterial soap and anti-itch powder with no relief.

## 2018-05-10 ENCOUNTER — Encounter (HOSPITAL_COMMUNITY): Payer: Self-pay

## 2018-05-10 ENCOUNTER — Emergency Department (HOSPITAL_COMMUNITY)
Admission: EM | Admit: 2018-05-10 | Discharge: 2018-05-11 | Disposition: A | Discharge status: Home / Self Care | Payer: Self-pay | Attending: Emergency Medicine | Admitting: Emergency Medicine

## 2018-05-10 ENCOUNTER — Emergency Department (HOSPITAL_COMMUNITY): Payer: Self-pay

## 2018-05-10 DIAGNOSIS — L03011 Cellulitis of right finger: Secondary | ICD-10-CM | POA: Insufficient documentation

## 2018-05-10 DIAGNOSIS — R519 Headache, unspecified: Secondary | ICD-10-CM

## 2018-05-10 DIAGNOSIS — R51 Headache: Secondary | ICD-10-CM | POA: Insufficient documentation

## 2018-05-10 DIAGNOSIS — D573 Sickle-cell trait: Secondary | ICD-10-CM | POA: Insufficient documentation

## 2018-05-10 DIAGNOSIS — Z79899 Other long term (current) drug therapy: Secondary | ICD-10-CM | POA: Insufficient documentation

## 2018-05-10 LAB — ACETAMINOPHEN LEVEL

## 2018-05-10 LAB — COMPREHENSIVE METABOLIC PANEL
ALBUMIN: 3.7 g/dL (ref 3.5–5.0)
ALT: 25 U/L (ref 0–44)
AST: 26 U/L (ref 15–41)
Alkaline Phosphatase: 44 U/L (ref 38–126)
Anion gap: 8 (ref 5–15)
BUN: 9 mg/dL (ref 6–20)
CHLORIDE: 107 mmol/L (ref 98–111)
CO2: 28 mmol/L (ref 22–32)
Calcium: 8.7 mg/dL — ABNORMAL LOW (ref 8.9–10.3)
Creatinine, Ser: 0.96 mg/dL (ref 0.61–1.24)
GFR calc Af Amer: >60 mL/min (ref >60)
GFR calc non Af Amer: >60 mL/min (ref >60)
GLUCOSE: 107 mg/dL — AB (ref 70–99)
POTASSIUM: 3.5 mmol/L (ref 3.5–5.1)
Sodium: 143 mmol/L (ref 135–145)
Total Bilirubin: 0.6 mg/dL (ref 0.3–1.2)
Total Protein: 7 g/dL (ref 6.5–8.1)

## 2018-05-10 LAB — CBC
HEMATOCRIT: 39.3 % (ref 39.0–52.0)
Hemoglobin: 13.5 g/dL (ref 13.0–17.0)
MCH: 28.4 pg (ref 26.0–34.0)
MCHC: 34.4 g/dL (ref 30.0–36.0)
MCV: 82.6 fL (ref 78.0–100.0)
Platelets: 269 10*3/uL (ref 150–400)
RBC: 4.76 MIL/uL (ref 4.22–5.81)
RDW: 13.7 % (ref 11.5–15.5)
WBC: 6.7 10*3/uL (ref 4.0–10.5)

## 2018-05-10 LAB — SALICYLATE LEVEL: Salicylate Lvl: <7 mg/dL (ref 2.8–30.0)

## 2018-05-10 MED ORDER — LIDOCAINE-PRILOCAINE 2.5-2.5 % EX CREA: TOPICAL_CREAM | Freq: Once | CUTANEOUS | Status: DC

## 2018-05-10 MED ORDER — SODIUM CHLORIDE 0.9 % IV BOLUS
1000.0000 mL | Freq: Once | INTRAVENOUS | Status: AC
  Administered 2018-05-10: 1000 mL via INTRAVENOUS

## 2018-05-10 MED ORDER — DIPHENHYDRAMINE HCL 50 MG/ML IJ SOLN
25.0000 mg | Freq: Once | INTRAMUSCULAR | Status: AC
  Administered 2018-05-10: 25 mg via INTRAVENOUS
  Filled 2018-05-10: qty 1

## 2018-05-10 MED ORDER — PROMETHAZINE HCL 25 MG/ML IJ SOLN
25.0000 mg | Freq: Once | INTRAMUSCULAR | Status: AC
  Administered 2018-05-10: 25 mg via INTRAVENOUS
  Filled 2018-05-10: qty 1

## 2018-05-10 MED ORDER — KETOROLAC TROMETHAMINE 15 MG/ML IJ SOLN
15.0000 mg | Freq: Once | INTRAMUSCULAR | Status: AC
  Administered 2018-05-10: 15 mg via INTRAVENOUS
  Filled 2018-05-10: qty 1

## 2018-05-10 NOTE — ED Triage Notes (Addendum)
Pt presents with c/o headache for the past 4 days consistently but reports he has been feeling this pain off and on for the past month. Pt reports nausea and vomiting with the headache. Pt reports no hx of migraines.

## 2018-05-11 MED ORDER — CEPHALEXIN 500 MG PO CAPS: 500.0000 mg | ORAL_CAPSULE | Freq: Four times a day (QID) | ORAL | 0 refills | Status: AC

## 2018-05-11 NOTE — Discharge Instructions (Signed)
Please make sure you are staying well hydrated.  °

## 2018-05-11 NOTE — ED Provider Notes (Signed)
[facility]-EMERGENCY DEPT Provider Note   CSN: [id number] Arrival date & time: 05/10/18  1709     History   Chief Complaint Chief Complaint  Patient presents with  . Headache    HPI Larry Estes is a 27 y.o. male with a past medical history of sickle cell trait, headaches, anxiety, who presents today for evaluation of headache for 4 days.  He reports that he has been trying Goody's powder without significant relief.  He reports that it feels like it is in the middle of his forehead and sharp stabbing like a knife.  He also reports that he has been vomiting, vomited twice today and twice yesterday which he says is from the pain.  Patient denies history of migraines.  Chart review shows that he is on, or than previously prescribed multiple headache related medications including Topamax, and Imitrex.  He was last seen in the emergency room on 03/03/2018 for a headache, and has been seen multiple times since then also, including 8/4 when he did not mention headache to the provider at [facility].  He denies any fevers or chills.  He also reports occasional chest pain and shortness of breath, however he does note that he has a history of anxiety and feels that that may be related.  HPI  Past Medical History:  Diagnosis Date  . Allergic angiitis (HCC)   . Allergy   . Anxiety   . Headache 12/26/2015  . Multiple allergies   . Sickle cell trait [facility])     Patient Active Problem List   Diagnosis Date Noted  . Headache 12/26/2015  . Myalgia 12/11/2015    History reviewed. No pertinent surgical history.      Home Medications    Prior to Admission medications   Medication Sig Start Date End Date Taking? Authorizing Provider  acetaminophen (TYLENOL) 500 MG tablet Take 1,000 mg by mouth every 6 (six) hours as needed for moderate pain.   Yes [provider]  Multiple Vitamin (MULTIVITAMIN WITH MINERALS) TABS tablet Take 1 tablet by mouth daily.   Yes  [provider]  amoxicillin (AMOXIL) 500 MG tablet Take 1 tablet (500 mg total) by mouth 3 (three) times daily. Patient not taking: Reported on 05/10/2018 04/09/18   Kem Boroughs B, FNP  amoxicillin-clavulanate (AUGMENTIN) 875-125 MG tablet Take 1 tablet by mouth every 12 (twelve) hours. Patient not taking: Reported on 05/10/2018 02/03/18   Roxy Horseman, PA-C  cephALEXin (KEFLEX) 500 MG capsule Take 1 capsule (500 mg total) by mouth 4 (four) times daily for 7 days. 05/11/18 05/18/18  Cristina Gong, PA-C  hydrOXYzine (ATARAX/VISTARIL) 25 MG tablet Take 1 tablet (25 mg total) by mouth every 6 (six) hours. Patient not taking: Reported on 05/10/2018 03/03/18   Roxy Horseman, PA-C  meclizine (ANTIVERT) 25 MG tablet Take 1 tablet (25 mg total) by mouth 2 (two) times daily as needed for dizziness. Patient not taking: Reported on 02/02/2018 01/09/17   Shaune Pollack, MD  meloxicam (MOBIC) 7.5 MG tablet Take 1 tablet (7.5 mg total) by mouth daily. Patient not taking: Reported on 02/02/2018 04/27/17   Caccavale, Sophia, PA-C  naproxen (NAPROSYN) 500 MG tablet Take 1 tablet (500 mg total) by mouth 2 (two) times daily with a meal. Patient not taking: Reported on 05/10/2018 04/09/18   Kem Boroughs B, FNP  omeprazole (PRILOSEC) 20 MG capsule Take 1 capsule (20 mg total) by mouth daily. Patient not taking: Reported on 02/02/2018 02/17/17   Antony Madura, PA-C  ondansetron (ZOFRAN ODT) 4 MG disintegrating tablet Take 1 tablet (4 mg total) by mouth every 8 (eight) hours as needed for nausea or vomiting. Patient not taking: Reported on 05/10/2018 02/03/18   Roxy Horseman, PA-C  ondansetron (ZOFRAN) 4 MG tablet Take 1 tablet (4 mg total) by mouth every 6 (six) hours. Patient not taking: Reported on 02/02/2018 12/24/17   Jaynie Crumble, PA-C  PARoxetine (PAXIL) 20 MG tablet Take 1 tablet (20 mg total) by mouth every morning. Patient not taking: Reported on 02/02/2018 09/20/17   Bethann Berkshire, MD  penicillin v  potassium (VEETID) 500 MG tablet Take 1 tablet (500 mg total) by mouth 3 (three) times daily. Patient not taking: Reported on 02/02/2018 12/24/17   Jaynie Crumble, PA-C  prochlorperazine (COMPAZINE) 10 MG tablet Take 1 tablet (10 mg total) by mouth every 8 (eight) hours as needed for nausea or vomiting. Patient not taking: Reported on 02/02/2018 01/09/17   Shaune Pollack, MD  SUMAtriptan (IMITREX) 50 MG tablet Take 1 tablet (50 mg total) by mouth every 2 (two) hours as needed for migraine. May repeat in 2 hours if headache persists or recurs. Patient not taking: Reported on 02/02/2018 04/27/17   Caccavale, Sophia, PA-C  terbinafine (LAMISIL) 1 % cream Apply 1 application topically 2 (two) times daily. Patient not taking: Reported on 05/10/2018 05/09/18   Tommi Rumps, PA-C  topiramate (TOPAMAX) 25 MG tablet Take one tablet at night for one week, then take 2 tablets at night for one week, then take 3 tablets at night. Patient not taking: Reported on 02/02/2018 11/12/16   Trixie Dredge, PA-C    Family History Family History  Problem Relation Age of Onset  . Hypertension Mother   . Hypertension Maternal Grandmother   . Hypertension Paternal Grandmother   . CAD Other   . Hypertension Other   . Diabetes Other     Social History Social History   Tobacco Use  . Smoking status: Never Smoker  . Smokeless tobacco: Never Used  Substance Use Topics  . Alcohol use: Yes    Comment: socially  . Drug use: No     Allergies   Fish allergy; Reglan [metoclopramide]; and Tramadol   Review of Systems Review of Systems  Constitutional: Negative for chills and fever.  Respiratory: Positive for shortness of breath. Negative for cough and chest tightness.   Cardiovascular: Positive for chest pain. Negative for palpitations and leg swelling.  Gastrointestinal: Positive for nausea and vomiting. Negative for abdominal pain and diarrhea.  Neurological: Positive for headaches.  All other systems reviewed  and are negative.    Physical Exam Updated Vital Signs BP 127/85 (BP Location: Left Arm)   Pulse 87   Temp 98.4 F (36.9 C) (Oral)   Resp 18   SpO2 100%   Physical Exam  Constitutional: He appears well-developed and well-nourished.  Non-toxic appearance. No distress.  HENT:  Head: Normocephalic and atraumatic.  Mouth/Throat: Oropharynx is clear and moist.  Eyes: Pupils are equal, round, and reactive to light. Conjunctivae and EOM are normal.  Neck: Normal range of motion. Neck supple. No JVD present. No neck rigidity.  Cardiovascular: Normal rate, regular rhythm, normal heart sounds and intact distal pulses.  No murmur heard. Pulmonary/Chest: Effort normal and breath sounds normal. No respiratory distress.  Abdominal: Soft. Bowel sounds are normal. He exhibits no distension. There is no tenderness.  Musculoskeletal: He exhibits no edema.  Neurological: He is alert.  Mental Status:  Alert, oriented, thought content appropriate, able  to give a coherent history. Speech fluent without evidence of aphasia. Able to follow 2 step commands without difficulty.  Cranial Nerves:  II:  Peripheral visual fields grossly normal, pupils equal, round, reactive to light III,IV, VI: ptosis not present, extra-ocular motions intact bilaterally  V,VII: smile symmetric, facial light touch sensation equal VIII: hearing grossly normal to voice  X: uvula elevates symmetrically  XI: bilateral shoulder shrug symmetric and strong XII: midline tongue extension without fassiculations Motor:  Normal tone. 5/5 in upper and lower extremities bilaterally including strong and equal grip strength and dorsiflexion/plantar flexion Cerebellar: normal finger-to-nose with bilateral upper extremities Gait: normal gait and balance CV: distal pulses palpable throughout    Skin: Skin is warm and dry.  Psychiatric: He has a normal mood and affect. His behavior is normal.  Nursing note and vitals reviewed.    ED  Treatments / Results  Labs (all labs ordered are listed, but only abnormal results are displayed) Labs Reviewed  COMPREHENSIVE METABOLIC PANEL - Abnormal; Notable for the following components:      Result Value   Glucose, Bld 107 (*)    Calcium 8.7 (*)    All other components within normal limits  ACETAMINOPHEN LEVEL - Abnormal; Notable for the following components:   Acetaminophen (Tylenol), Serum <10 (*)    All other components within normal limits  CBC  SALICYLATE LEVEL    EKG None  Radiology Dg Chest 2 View  Result Date: 05/10/2018 CLINICAL DATA:  27 year old male with history of chest pain and shortness of breath with headache for over 1 week. EXAM: CHEST - 2 VIEW COMPARISON:  Chest x-ray 04/09/2018. FINDINGS: Lung volumes are normal. No consolidative airspace disease. No pleural effusions. No pneumothorax. No pulmonary nodule or mass noted. Pulmonary vasculature and the cardiomediastinal silhouette are within normal limits. IMPRESSION: No radiographic evidence of acute cardiopulmonary disease. Electronically Signed   By: Trudie Reed M.D.   On: 05/10/2018 22:42   Ct Head Wo Contrast  Result Date: 05/10/2018 CLINICAL DATA:  Chronic intermittent headache. Nausea and vomiting. EXAM: CT HEAD WITHOUT CONTRAST TECHNIQUE: Contiguous axial images were obtained from the base of the skull through the vertex without intravenous contrast. COMPARISON:  CT of the head performed 11/25/2016 FINDINGS: Brain: No evidence of acute infarction, hemorrhage, hydrocephalus, extra-axial collection or mass lesion/mass effect. The posterior fossa, including the cerebellum, brainstem and fourth ventricle, is within normal limits. The third and lateral ventricles, and basal ganglia are unremarkable in appearance. The cerebral hemispheres are symmetric in appearance, with normal gray-white differentiation. No mass effect or midline shift is seen. Vascular: No hyperdense vessel or unexpected calcification. Skull:  There is no evidence of fracture; visualized osseous structures are unremarkable in appearance. Sinuses/Orbits: The visualized portions of the orbits are within normal limits. The paranasal sinuses and mastoid air cells are well-aerated. Other: No significant soft tissue abnormalities are seen. IMPRESSION: Unremarkable noncontrast CT of the head. Electronically Signed   By: Roanna Raider M.D.   On: 05/10/2018 22:13    Procedures Procedures (including critical care time)  Medications Ordered in ED Medications  sodium chloride 0.9 % bolus 1,000 mL (0 mLs Intravenous Stopped 05/10/18 2358)  diphenhydrAMINE (BENADRYL) injection 25 mg (25 mg Intravenous Given 05/10/18 2154)  promethazine (PHENERGAN) injection 25 mg (25 mg Intravenous Given 05/10/18 2152)  ketorolac (TORADOL) 15 MG/ML injection 15 mg (15 mg Intravenous Given 05/10/18 2153)     Initial Impression / Assessment and Plan / ED Course  I have reviewed the  triage vital signs and the nursing notes.  Pertinent labs & imaging results that were available during my care of the patient were reviewed by me and considered in my medical decision making (see chart for details).     Pt HA treated and improved while in ED. he reported that he had no history of migraines, however he has been seen multiple times in the emergency room for headaches and is on 2 migraine related medications.  CT head was obtained without abnormalities.  He is afebrile with no focal neuro deficits, nuchal rigidity, or vision change.   Pt is to follow up with PCP to discuss prophylactic medication. Pt verbalizes understanding and is agreeable with plan to dc.   As he reports chest pain and shortness of breath EKG and chest x-ray were obtained, both of which was normal.  PERC negative.    Return precautions were discussed with patient who states their understanding.  At the time of discharge patient denied any unaddressed complaints or concerns.  Patient is agreeable for  discharge home.   Final Clinical Impressions(s) / ED Diagnoses   Final diagnoses:  Acute nonintractable headache, unspecified headache type  Paronychia of right middle finger    ED Discharge Orders        Ordered    cephALEXin (KEFLEX) 500 MG capsule  4 times daily     05/11/18 0002       Cristina GongHammond, Ruthanna Macchia W, [place]PA-C 05/11/18 Waunita [id number]    Arby BarrettePfeiffer, Marcy, MD 05/25/18 0830

## 2018-05-14 ENCOUNTER — Emergency Department
Admission: EM | Admit: 2018-05-14 | Discharge: 2018-05-14 | Disposition: A | Discharge status: Home / Self Care | Payer: Self-pay | Attending: Emergency Medicine | Admitting: Emergency Medicine

## 2018-05-14 ENCOUNTER — Other Ambulatory Visit: Payer: Self-pay

## 2018-05-14 DIAGNOSIS — L03011 Cellulitis of right finger: Secondary | ICD-10-CM | POA: Insufficient documentation

## 2018-05-14 MED ORDER — LIDOCAINE HCL (PF) 1 % IJ SOLN
5.0000 mL | Freq: Once | INTRAMUSCULAR | Status: AC
  Administered 2018-05-14: 5 mL
  Filled 2018-05-14: qty 5

## 2018-05-14 MED ORDER — IBUPROFEN 800 MG PO TABS
800.0000 mg | ORAL_TABLET | Freq: Once | ORAL | Status: AC
  Administered 2018-05-14: 800 mg via ORAL
  Filled 2018-05-14: qty 1

## 2018-05-14 MED ORDER — SULFAMETHOXAZOLE-TRIMETHOPRIM 800-160 MG PO TABS
2.0000 | ORAL_TABLET | Freq: Once | ORAL | Status: AC
Start: 2018-05-14 — End: 2018-05-14
  Administered 2018-05-14: 2 via ORAL
  Filled 2018-05-14: qty 2

## 2018-05-14 MED ORDER — BACITRACIN ZINC 500 UNIT/GM EX OINT
TOPICAL_OINTMENT | CUTANEOUS | Status: AC
  Administered 2018-05-14: 1
  Filled 2018-05-14: qty 0.9

## 2018-05-14 MED ORDER — IBUPROFEN 800 MG PO TABS: 800.0000 mg | ORAL_TABLET | Freq: Three times a day (TID) | ORAL | 0 refills | Status: DC | PRN

## 2018-05-14 MED ORDER — SULFAMETHOXAZOLE-TRIMETHOPRIM 800-160 MG PO TABS: 2.0000 | ORAL_TABLET | Freq: Two times a day (BID) | ORAL | 0 refills | Status: DC

## 2018-05-14 NOTE — ED Triage Notes (Signed)
Pt in with swelling and pain noted around nail bed of right 3rd finger.

## 2018-05-14 NOTE — Discharge Instructions (Addendum)
1.  Take antibiotic as prescribed (Bactrim DS 2 tablets twice daily x10 days). 2.  You may take Motrin as needed for pain. 3.  Return to the ER for worsening symptoms, increased redness/swelling, or other concerns.

## 2018-05-14 NOTE — ED Provider Notes (Signed)
[facility] Emergency Department Provider Note   ____________________________________________   First MD Initiated Contact with Patient 05/14/18 [id number]     (approximate)  I have reviewed the triage vital signs and the nursing notes.   HISTORY  Chief Complaint Nail Problem    HPI Larry Estes is a 27 y.o. male who presents to the ED from home with a chief complaint of right middle finger pain.  Patient complains of a 4 to 5-day history of increased swelling and pain around the nailbed of his right middle finger.  Denies drainage.  Denies associated fever, chills, chest pain, shortness of breath, abdominal pain, nausea or vomiting.  Patient is right-hand dominant.   Past Medical History:  Diagnosis Date  . Allergic angiitis (HCC)   . Allergy   . Anxiety   . Headache 12/26/2015  . Multiple allergies   . Sickle cell trait [facility](HCC)     Patient Active Problem List   Diagnosis Date Noted  . Headache 12/26/2015  . Myalgia 12/11/2015    No past surgical history on file.  Prior to Admission medications   Medication Sig Start Date End Date Taking? Authorizing Provider  acetaminophen (TYLENOL) 500 MG tablet Take 1,000 mg by mouth every 6 (six) hours as needed for moderate pain.    [provider]  amoxicillin (AMOXIL) 500 MG tablet Take 1 tablet (500 mg total) by mouth 3 (three) times daily. Patient not taking: Reported on 05/10/2018 04/09/18   Kem Boroughsriplett, Cari B, FNP  amoxicillin-clavulanate (AUGMENTIN) 875-125 MG tablet Take 1 tablet by mouth every 12 (twelve) hours. Patient not taking: Reported on 05/10/2018 02/03/18   Roxy HorsemanBrowning, Robert, PA-C  cephALEXin (KEFLEX) 500 MG capsule Take 1 capsule (500 mg total) by mouth 4 (four) times daily for 7 days. 05/11/18 05/18/18  Cristina GongHammond, Elizabeth W, PA-C  hydrOXYzine (ATARAX/VISTARIL) 25 MG tablet Take 1 tablet (25 mg total) by mouth every 6 (six) hours. Patient not taking: Reported on 05/10/2018 03/03/18   Roxy HorsemanBrowning,  Robert, PA-C  ibuprofen (ADVIL,MOTRIN) 800 MG tablet Take 1 tablet (800 mg total) by mouth every 8 (eight) hours as needed for moderate pain. 05/14/18   Irean HongSung, Jade J, MD  meclizine (ANTIVERT) 25 MG tablet Take 1 tablet (25 mg total) by mouth 2 (two) times daily as needed for dizziness. Patient not taking: Reported on 02/02/2018 01/09/17   Shaune PollackIsaacs, Cameron, MD  meloxicam (MOBIC) 7.5 MG tablet Take 1 tablet (7.5 mg total) by mouth daily. Patient not taking: Reported on 02/02/2018 04/27/17   Caccavale, Sophia, PA-C  Multiple Vitamin (MULTIVITAMIN WITH MINERALS) TABS tablet Take 1 tablet by mouth daily.    [provider]  naproxen (NAPROSYN) 500 MG tablet Take 1 tablet (500 mg total) by mouth 2 (two) times daily with a meal. Patient not taking: Reported on 05/10/2018 04/09/18   Kem Boroughsriplett, Cari B, FNP  omeprazole (PRILOSEC) 20 MG capsule Take 1 capsule (20 mg total) by mouth daily. Patient not taking: Reported on 02/02/2018 02/17/17   Antony MaduraHumes, Kelly, PA-C  ondansetron (ZOFRAN ODT) 4 MG disintegrating tablet Take 1 tablet (4 mg total) by mouth every 8 (eight) hours as needed for nausea or vomiting. Patient not taking: Reported on 05/10/2018 02/03/18   Roxy HorsemanBrowning, Robert, PA-C  ondansetron (ZOFRAN) 4 MG tablet Take 1 tablet (4 mg total) by mouth every 6 (six) hours. Patient not taking: Reported on 02/02/2018 12/24/17   Jaynie CrumbleKirichenko, Tatyana, PA-C  PARoxetine (PAXIL) 20 MG tablet Take 1 tablet (20 mg total) by mouth every  morning. Patient not taking: Reported on 02/02/2018 09/20/17   Bethann Berkshire, MD  penicillin v potassium (VEETID) 500 MG tablet Take 1 tablet (500 mg total) by mouth 3 (three) times daily. Patient not taking: Reported on 02/02/2018 12/24/17   Jaynie Crumble, PA-C  prochlorperazine (COMPAZINE) 10 MG tablet Take 1 tablet (10 mg total) by mouth every 8 (eight) hours as needed for nausea or vomiting. Patient not taking: Reported on 02/02/2018 01/09/17   Shaune Pollack, MD  sulfamethoxazole-trimethoprim  (BACTRIM DS,SEPTRA DS) 800-160 MG tablet Take 2 tablets by mouth 2 (two) times daily. 05/14/18   Irean Hong, MD  SUMAtriptan (IMITREX) 50 MG tablet Take 1 tablet (50 mg total) by mouth every 2 (two) hours as needed for migraine. May repeat in 2 hours if headache persists or recurs. Patient not taking: Reported on 02/02/2018 04/27/17   Caccavale, Sophia, PA-C  terbinafine (LAMISIL) 1 % cream Apply 1 application topically 2 (two) times daily. Patient not taking: Reported on 05/10/2018 05/09/18   Tommi Rumps, PA-C  topiramate (TOPAMAX) 25 MG tablet Take one tablet at night for one week, then take 2 tablets at night for one week, then take 3 tablets at night. Patient not taking: Reported on 02/02/2018 11/12/16   Trixie Dredge, PA-C    Allergies Fish allergy; Reglan [metoclopramide]; and Tramadol  Family History  Problem Relation Age of Onset  . Hypertension Mother   . Hypertension Maternal Grandmother   . Hypertension Paternal Grandmother   . CAD Other   . Hypertension Other   . Diabetes Other     Social History Social History   Tobacco Use  . Smoking status: Never Smoker  . Smokeless tobacco: Never Used  Substance Use Topics  . Alcohol use: Yes    Comment: socially  . Drug use: No    Review of Systems  Constitutional: No fever/chills Eyes: No visual changes. ENT: No sore throat. Cardiovascular: Denies chest pain. Respiratory: Denies shortness of breath. Gastrointestinal: No abdominal pain.  No nausea, no vomiting.  No diarrhea.  No constipation. Genitourinary: Negative for dysuria. Musculoskeletal: Positive for right middle finger swelling and pain.  Negative for back pain. Skin: Negative for rash. Neurological: Negative for headaches, focal weakness or numbness.   ____________________________________________   PHYSICAL EXAM:  VITAL SIGNS: ED Triage Vitals  Enc Vitals Group     BP 05/14/18 0013 134/80     Pulse Rate 05/14/18 0013 69     Resp 05/14/18 0013 18      Temp 05/14/18 0013 98.1 F (36.7 C)     Temp Source 05/14/18 0013 Oral     SpO2 05/14/18 0013 100 %     Weight 05/14/18 0010 200 lb (90.7 kg)     Height 05/14/18 0010 5\' 7"  (1.702 m)     Head Circumference --      Peak Flow --      Pain Score 05/14/18 0010 10     Pain Loc --      Pain Edu? --      Excl. in GC? --     Constitutional: Alert and oriented. Well appearing and in no acute distress. Eyes: Conjunctivae are normal. PERRL. EOMI. Head: Atraumatic. Nose: No congestion/rhinnorhea. Mouth/Throat: Mucous membranes are moist.  Oropharynx non-erythematous. Neck: No stridor.   Cardiovascular: Normal rate, regular rhythm. Grossly normal heart sounds.  Good peripheral circulation. Respiratory: Normal respiratory effort.  No retractions. Lungs CTAB. Gastrointestinal: Soft and nontender. No distention. No abdominal bruits. No CVA tenderness. Musculoskeletal:  Right third digit with moderately sized paronychia to medial aspect of nailbed. No lower extremity tenderness nor edema.  No joint effusions. Neurologic:  Normal speech and language. No gross focal neurologic deficits are appreciated. No gait instability. Skin:  Skin is warm, dry and intact. No rash noted. Psychiatric: Mood and affect are normal. Speech and behavior are normal.  ____________________________________________   LABS (all labs ordered are listed, but only abnormal results are displayed)  Labs Reviewed - No data to display ____________________________________________  EKG   ____________________________________________  RADIOLOGY  ED MD interpretation: None  Official radiology report(s): No results found.  ____________________________________________   PROCEDURES  Procedure(s) performed:     Marland KitchenMarland KitchenIncision and Drainage Date/Time: 05/14/2018 2:30 AM Performed by: Irean Hong, MD Authorized by: Irean Hong, MD   Consent:    Consent obtained:  Verbal   Consent given by:  Patient   Risks discussed:   Bleeding, infection, incomplete drainage and pain   Alternatives discussed:  Observation and no treatment Location:    Type:  Abscess (paronychia)   Size:  Moderate   Location:  Upper extremity   Upper extremity location:  Finger   Finger location:  R long finger Pre-procedure details:    Skin preparation:  Betadine Anesthesia (see MAR for exact dosages):    Anesthesia method:  Local infiltration (digital block)   Local anesthetic:  Lidocaine 1% w/o epi Procedure type:    Complexity:  Simple Procedure details:    Incision types:  Single straight   Incision depth:  Dermal   Scalpel blade:  15   Wound management:  Probed and deloculated   Drainage:  Purulent   Drainage amount:  Moderate   Wound treatment:  Wound left open   Packing materials:  None Post-procedure details:    Patient tolerance of procedure:  Tolerated well, no immediate complications    Critical Care performed: No  ____________________________________________   INITIAL IMPRESSION / ASSESSMENT AND PLAN / ED COURSE  As part of my medical decision making, I reviewed the following data within the electronic MEDICAL RECORD NUMBER Nursing notes reviewed and incorporated and Notes from prior ED visits   27 year old male who presents with right third digit paronychia.  Will plan for for I&D, antibiotics, analgesia.  Clinical Course as of May 14 450  Fri May 14, 2018  0231 Patient tolerated procedure well.  Will discharge home on Bactrim, NSAIDs, analgesia and he will follow-up with his PCP next week.  Strict return precautions given.  Patient verbalizes understanding and agrees with plan of care.   [JS]    Clinical Course User Index [JS] Irean Hong, MD     ____________________________________________   FINAL CLINICAL IMPRESSION(S) / ED DIAGNOSES  Final diagnoses:  Paronychia of right middle finger     ED Discharge Orders         Ordered    sulfamethoxazole-trimethoprim (BACTRIM DS,SEPTRA DS)  800-160 MG tablet  2 times daily     05/14/18 0055    ibuprofen (ADVIL,MOTRIN) 800 MG tablet  Every 8 hours PRN     05/14/18 0232           Note:  This document was prepared using Dragon voice recognition software and may include unintentional dictation errors.    Irean Hong, MD 05/14/18 [phone]

## 2018-05-19 ENCOUNTER — Emergency Department (HOSPITAL_COMMUNITY)
Admission: EM | Admit: 2018-05-19 | Discharge: 2018-05-19 | Disposition: A | Discharge status: Home / Self Care | Payer: Self-pay | Attending: Emergency Medicine | Admitting: Emergency Medicine

## 2018-05-19 ENCOUNTER — Other Ambulatory Visit: Payer: Self-pay

## 2018-05-19 ENCOUNTER — Encounter (HOSPITAL_COMMUNITY): Payer: Self-pay | Admitting: Emergency Medicine

## 2018-05-19 DIAGNOSIS — B349 Viral infection, unspecified: Secondary | ICD-10-CM | POA: Insufficient documentation

## 2018-05-19 LAB — URINALYSIS, ROUTINE W REFLEX MICROSCOPIC
Bacteria, UA: NONE SEEN
Bilirubin Urine: NEGATIVE
GLUCOSE, UA: NEGATIVE mg/dL
Ketones, ur: NEGATIVE mg/dL
Leukocytes, UA: NEGATIVE
Nitrite: NEGATIVE
PH: 7 (ref 5.0–8.0)
Protein, ur: NEGATIVE mg/dL
Specific Gravity, Urine: 1.01 (ref 1.005–1.030)

## 2018-05-19 LAB — COMPREHENSIVE METABOLIC PANEL
ALK PHOS: 46 U/L (ref 38–126)
ALT: 35 U/L (ref 0–44)
AST: 38 U/L (ref 15–41)
Albumin: 3.7 g/dL (ref 3.5–5.0)
Anion gap: 9 (ref 5–15)
BUN: 7 mg/dL (ref 6–20)
CALCIUM: 9.3 mg/dL (ref 8.9–10.3)
CO2: 23 mmol/L (ref 22–32)
CREATININE: 1.02 mg/dL (ref 0.61–1.24)
Chloride: 103 mmol/L (ref 98–111)
Glucose, Bld: 118 mg/dL — ABNORMAL HIGH (ref 70–99)
Potassium: 3.6 mmol/L (ref 3.5–5.1)
SODIUM: 135 mmol/L (ref 135–145)
Total Bilirubin: 0.8 mg/dL (ref 0.3–1.2)
Total Protein: 7.1 g/dL (ref 6.5–8.1)

## 2018-05-19 LAB — CBC
HCT: 46.6 % (ref 39.0–52.0)
Hemoglobin: 15.5 g/dL (ref 13.0–17.0)
MCH: 27.8 pg (ref 26.0–34.0)
MCHC: 33.3 g/dL (ref 30.0–36.0)
MCV: 83.5 fL (ref 78.0–100.0)
PLATELETS: 231 10*3/uL (ref 150–400)
RBC: 5.58 MIL/uL (ref 4.22–5.81)
RDW: 13.3 % (ref 11.5–15.5)
WBC: 4.8 10*3/uL (ref 4.0–10.5)

## 2018-05-19 LAB — RAPID HIV SCREEN (HIV 1/2 AB+AG)
HIV 1/2 Antibodies: NONREACTIVE
HIV-1 P24 ANTIGEN - HIV24: NONREACTIVE

## 2018-05-19 LAB — LIPASE, BLOOD: Lipase: 33 U/L (ref 11–51)

## 2018-05-19 MED ORDER — ONDANSETRON 4 MG PO TBDP
4.0000 mg | ORAL_TABLET | Freq: Once | ORAL | Status: AC
Start: 2018-05-19 — End: 2018-05-19
  Administered 2018-05-19: 4 mg via ORAL
  Filled 2018-05-19: qty 1

## 2018-05-19 MED ORDER — IBUPROFEN 800 MG PO TABS
800.0000 mg | ORAL_TABLET | Freq: Once | ORAL | Status: AC
  Administered 2018-05-19: 800 mg via ORAL
  Filled 2018-05-19: qty 1

## 2018-05-19 MED ORDER — IBUPROFEN 800 MG PO TABS: 800.0000 mg | ORAL_TABLET | Freq: Three times a day (TID) | ORAL | 0 refills | Status: DC

## 2018-05-19 MED ORDER — ONDANSETRON 4 MG PO TBDP: 4.0000 mg | ORAL_TABLET | Freq: Three times a day (TID) | ORAL | 0 refills | Status: DC | PRN

## 2018-05-19 NOTE — ED Triage Notes (Signed)
Pt presents with multiple complaints. Pt reports gen body aches, fever, chills, nasal congestion, N/V. Pt also requesting to be tested for HIV.

## 2018-05-19 NOTE — ED Provider Notes (Addendum)
[facility] EMERGENCY DEPARTMENT Provider Note   CSN: [id number] Arrival date & time: 05/19/18  1191     History   Chief Complaint Chief Complaint  Patient presents with  . Generalized Body Aches    HPI Larry Estes is a 27 y.o. male.  The history is provided by the patient and medical records.    27 y.o. F with hx of seasonal allergies, anxiety, migraine headaches, sickle cell trait, presenting to the ED for multiple concerns.  Notably, patient reports low grade fever, headache, nasal congestion, body aches, nausea, vomiting, and loose stools for the past 3 days.  Denies sick contacts.  No chest pain, cough, or SOB.  States he is concerned he may have HIV because "it's not normal to just get sick like that out of nowhere".  He states he has been able to eat/drink without issue.  No meds taken PTA.  Past Medical History:  Diagnosis Date  . Allergic angiitis (HCC)   . Allergy   . Anxiety   . Headache 12/26/2015  . Multiple allergies   . Sickle cell trait [facility])     Patient Active Problem List   Diagnosis Date Noted  . Headache 12/26/2015  . Myalgia 12/11/2015    History reviewed. No pertinent surgical history.      Home Medications    Prior to Admission medications   Medication Sig Start Date End Date Taking? Authorizing Provider  amoxicillin (AMOXIL) 500 MG tablet Take 1 tablet (500 mg total) by mouth 3 (three) times daily. Patient not taking: Reported on 05/10/2018 04/09/18   Kem Boroughs B, FNP  amoxicillin-clavulanate (AUGMENTIN) 875-125 MG tablet Take 1 tablet by mouth every 12 (twelve) hours. Patient not taking: Reported on 05/10/2018 02/03/18   Roxy Horseman, PA-C  hydrOXYzine (ATARAX/VISTARIL) 25 MG tablet Take 1 tablet (25 mg total) by mouth every 6 (six) hours. Patient not taking: Reported on 05/10/2018 03/03/18   Roxy Horseman, PA-C  ibuprofen (ADVIL,MOTRIN) 800 MG tablet Take 1 tablet (800 mg total) by mouth every 8 (eight) hours as  needed for moderate pain. Patient not taking: Reported on 05/19/2018 05/14/18   Irean Hong, MD  meclizine (ANTIVERT) 25 MG tablet Take 1 tablet (25 mg total) by mouth 2 (two) times daily as needed for dizziness. Patient not taking: Reported on 02/02/2018 01/09/17   Shaune Pollack, MD  meloxicam (MOBIC) 7.5 MG tablet Take 1 tablet (7.5 mg total) by mouth daily. Patient not taking: Reported on 02/02/2018 04/27/17   Caccavale, Sophia, PA-C  naproxen (NAPROSYN) 500 MG tablet Take 1 tablet (500 mg total) by mouth 2 (two) times daily with a meal. Patient not taking: Reported on 05/10/2018 04/09/18   Kem Boroughs B, FNP  omeprazole (PRILOSEC) 20 MG capsule Take 1 capsule (20 mg total) by mouth daily. Patient not taking: Reported on 02/02/2018 02/17/17   Antony Madura, PA-C  ondansetron (ZOFRAN ODT) 4 MG disintegrating tablet Take 1 tablet (4 mg total) by mouth every 8 (eight) hours as needed for nausea or vomiting. Patient not taking: Reported on 05/10/2018 02/03/18   Roxy Horseman, PA-C  ondansetron (ZOFRAN) 4 MG tablet Take 1 tablet (4 mg total) by mouth every 6 (six) hours. Patient not taking: Reported on 02/02/2018 12/24/17   Jaynie Crumble, PA-C  PARoxetine (PAXIL) 20 MG tablet Take 1 tablet (20 mg total) by mouth every morning. Patient not taking: Reported on 02/02/2018 09/20/17   Bethann Berkshire, MD  penicillin v potassium (VEETID) 500 MG tablet Take 1  tablet (500 mg total) by mouth 3 (three) times daily. Patient not taking: Reported on 02/02/2018 12/24/17   Jaynie CrumbleKirichenko, Tatyana, PA-C  prochlorperazine (COMPAZINE) 10 MG tablet Take 1 tablet (10 mg total) by mouth every 8 (eight) hours as needed for nausea or vomiting. Patient not taking: Reported on 02/02/2018 01/09/17   Shaune PollackIsaacs, Cameron, MD  sulfamethoxazole-trimethoprim (BACTRIM DS,SEPTRA DS) 800-160 MG tablet Take 2 tablets by mouth 2 (two) times daily. Patient not taking: Reported on 05/19/2018 05/14/18   Irean HongSung, Jade J, MD  SUMAtriptan (IMITREX) 50 MG tablet  Take 1 tablet (50 mg total) by mouth every 2 (two) hours as needed for migraine. May repeat in 2 hours if headache persists or recurs. Patient not taking: Reported on 02/02/2018 04/27/17   Caccavale, Sophia, PA-C  terbinafine (LAMISIL) 1 % cream Apply 1 application topically 2 (two) times daily. Patient not taking: Reported on 05/10/2018 05/09/18   Tommi RumpsSummers, Rhonda L, PA-C  topiramate (TOPAMAX) 25 MG tablet Take one tablet at night for one week, then take 2 tablets at night for one week, then take 3 tablets at night. Patient not taking: Reported on 02/02/2018 11/12/16   Trixie DredgeWest, Emily, PA-C    Family History Family History  Problem Relation Age of Onset  . Hypertension Mother   . Hypertension Maternal Grandmother   . Hypertension Paternal Grandmother   . CAD Other   . Hypertension Other   . Diabetes Other     Social History Social History   Tobacco Use  . Smoking status: Never Smoker  . Smokeless tobacco: Never Used  Substance Use Topics  . Alcohol use: Yes    Comment: socially  . Drug use: No     Allergies   Fish allergy; Reglan [metoclopramide]; and Tramadol   Review of Systems Review of Systems  Constitutional: Positive for fever.  HENT: Positive for congestion.   Gastrointestinal: Positive for diarrhea, nausea and vomiting.  Musculoskeletal: Positive for myalgias.  All other systems reviewed and are negative.    Physical Exam Updated Vital Signs BP (!) 116/55   Pulse 92   Temp (!) 100.8 F (38.2 C) (Oral)   Resp 18   Ht 5\' 7"  (1.702 m)   Wt 90.7 kg   SpO2 98%   BMI 31.32 kg/m   Physical Exam  Constitutional: He is oriented to person, place, and time. He appears well-developed and well-nourished.  HENT:  Head: Normocephalic and atraumatic.  Right Ear: Tympanic membrane and ear canal normal.  Left Ear: Tympanic membrane and ear canal normal.  Nose: Mucosal edema present.  Mouth/Throat: Uvula is midline, oropharynx is clear and moist and mucous membranes are  normal.  + nasal congestion with PND Tonsils overall normal in appearance bilaterally without exudate; uvula midline without evidence of peritonsillar abscess; handling secretions appropriately; no difficulty swallowing or speaking; normal phonation without stridor  Eyes: Pupils are equal, round, and reactive to light. Conjunctivae and EOM are normal.  Neck: Normal range of motion. No neck rigidity.  Cardiovascular: Normal rate, regular rhythm and normal heart sounds.  Pulmonary/Chest: Effort normal and breath sounds normal. No stridor. No respiratory distress. He has no wheezes. He has no rhonchi.  Abdominal: Soft. Bowel sounds are normal. There is no tenderness. There is no rigidity and no guarding.  Soft, non-tender  Musculoskeletal: Normal range of motion.  Neurological: He is alert and oriented to person, place, and time.  AAOx3, answering questions and following commands appropriately; equal strength UE and LE bilaterally; CN grossly intact; moves  all extremities appropriately without ataxia; no focal neuro deficits or facial asymmetry appreciated  Skin: Skin is warm and dry.  Psychiatric: His mood appears anxious.  Somewhat anxious, very worried during exam  Nursing note and vitals reviewed.    ED Treatments / Results  Labs (all labs ordered are listed, but only abnormal results are displayed) Labs Reviewed  COMPREHENSIVE METABOLIC PANEL - Abnormal; Notable for the following components:      Result Value   Glucose, Bld 118 (*)    All other components within normal limits  URINALYSIS, ROUTINE W REFLEX MICROSCOPIC - Abnormal; Notable for the following components:   Hgb urine dipstick SMALL (*)    All other components within normal limits  LIPASE, BLOOD  CBC  RAPID HIV SCREEN (HIV 1/2 AB+AG)    EKG None  Radiology No results found.  Procedures Procedures (including critical care time)  Medications Ordered in ED Medications  ibuprofen (ADVIL,MOTRIN) tablet 800 mg (800  mg Oral Given 05/19/18 0523)  ondansetron (ZOFRAN-ODT) disintegrating tablet 4 mg (4 mg Oral Given 05/19/18 0523)     Initial Impression / Assessment and Plan / ED Course  I have reviewed the triage vital signs and the nursing notes.  Pertinent labs & imaging results that were available during my care of the patient were reviewed by me and considered in my medical decision making (see chart for details).  27 year old male here with multiple complaints, notably low-grade fever, generalized body aches, headache, nasal congestion, nausea, vomiting, and diarrhea.  He has a low-grade fever but is nontoxic in appearance.  He does appear very anxious and worried.  His exam is benign aside from some mild nasal congestion and postnasal drip.  Lungs are clear without any wheezes or rhonchi.  Abdomen is soft and benign.  Headache without focal deficits or meningeal signs.  Screening labs from triage are very reassuring.  Patient did request HIV testing that is negative.  I have gone over all the results with patient, he feels reassured at this time.  Constellation of symptoms likely viral in nature.  Discussed symptomatic care at home, rest, and oral hydration.  He can follow-up closely with his primary care doctor.  Discussed plan with patient, he acknowledged understanding and agreed with plan of care.  Return precautions given for new or worsening symptoms.  Final Clinical Impressions(s) / ED Diagnoses   Final diagnoses:  Viral syndrome    ED Discharge Orders         Ordered    ibuprofen (ADVIL,MOTRIN) 800 MG tablet  3 times daily     05/19/18 0540    ondansetron (ZOFRAN ODT) 4 MG disintegrating tablet  Every 8 hours PRN     05/19/18 0540           Garlon HatchetSanders, Haileyann Staiger M, PA-C 05/19/18 0547    Garlon HatchetSanders, Mechel Haggard M, PA-C 05/19/18 0548    Nira Connardama, Pedro Eduardo, MD 05/19/18 [phone]

## 2018-05-19 NOTE — ED Notes (Signed)
Signature pad unavailable at time of pt discharge. Pt verbalized understanding of d/c instructions and prescriptions pt denied any further requests.

## 2018-05-19 NOTE — Discharge Instructions (Signed)
Like we talked about, all your labs today looked great. Take the prescribed medication as directed.  Make sure to rest and drink fluids. Follow-up with your primary care doctor. Return to the ED for new or worsening symptoms.

## 2018-05-23 ENCOUNTER — Emergency Department (HOSPITAL_COMMUNITY)
Admission: EM | Admit: 2018-05-23 | Discharge: 2018-05-23 | Disposition: A | Discharge status: Home / Self Care | Payer: Self-pay | Attending: Emergency Medicine | Admitting: Emergency Medicine

## 2018-05-23 ENCOUNTER — Encounter (HOSPITAL_COMMUNITY): Payer: Self-pay | Admitting: Emergency Medicine

## 2018-05-23 ENCOUNTER — Other Ambulatory Visit: Payer: Self-pay

## 2018-05-23 DIAGNOSIS — R51 Headache: Secondary | ICD-10-CM | POA: Insufficient documentation

## 2018-05-23 DIAGNOSIS — R519 Headache, unspecified: Secondary | ICD-10-CM

## 2018-05-23 MED ORDER — DEXAMETHASONE SODIUM PHOSPHATE 10 MG/ML IJ SOLN
10.0000 mg | Freq: Once | INTRAMUSCULAR | Status: AC
Start: 2018-05-23 — End: 2018-05-23
  Administered 2018-05-23: 10 mg via INTRAVENOUS
  Filled 2018-05-23: qty 1

## 2018-05-23 MED ORDER — FLUTICASONE PROPIONATE 50 MCG/ACT NA SUSP: 1.0000 | Freq: Every day | NASAL | 0 refills | Status: DC

## 2018-05-23 MED ORDER — OXYMETAZOLINE HCL 0.05 % NA SOLN: 1.0000 | Freq: Two times a day (BID) | NASAL | 0 refills | Status: DC

## 2018-05-23 MED ORDER — ONDANSETRON HCL 4 MG/2ML IJ SOLN
4.0000 mg | Freq: Once | INTRAMUSCULAR | Status: AC
  Administered 2018-05-23: 4 mg via INTRAVENOUS
  Filled 2018-05-23: qty 2

## 2018-05-23 MED ORDER — IBUPROFEN 800 MG PO TABS: 800.0000 mg | ORAL_TABLET | Freq: Three times a day (TID) | ORAL | 0 refills | Status: DC | PRN

## 2018-05-23 MED ORDER — OXYMETAZOLINE HCL 0.05 % NA SOLN: 1.0000 | Freq: Two times a day (BID) | NASAL | 0 refills | Status: AC

## 2018-05-23 MED ORDER — KETOROLAC TROMETHAMINE 15 MG/ML IJ SOLN
15.0000 mg | Freq: Once | INTRAMUSCULAR | Status: AC
  Administered 2018-05-23: 15 mg via INTRAVENOUS
  Filled 2018-05-23: qty 1

## 2018-05-23 MED ORDER — SODIUM CHLORIDE 0.9 % IV BOLUS
1000.0000 mL | Freq: Once | INTRAVENOUS | Status: AC
  Administered 2018-05-23: 1000 mL via INTRAVENOUS

## 2018-05-23 NOTE — ED Triage Notes (Signed)
C/o throbbing headache x 4-5 days.  States he was seen here this week for viral infection and low grade fever and body aches are still present but getting better.  Took 2 Goody Powder 1 1/2 hrs ago that relieved headache for about an hour.  Pt is currently moving and has been out in heat and not drinking a lot of fluids.  No neuro deficits.

## 2018-05-23 NOTE — ED Provider Notes (Signed)
[facility] Provider Note   CSN: [id number] Arrival date & time: 05/23/18  1517     History   Chief Complaint Chief Complaint  Patient presents with  . Headache    HPI Larry Estes is a 27 y.o. male presenting for evaluation of HA.   Pt states he was recently diagnosed with a viral URI. His sxs have mostly been improving, but he has worsening HA. He states his head is throbbing, and feels like there is a band wrapped around his head. He reports frontal pressure. He has been taking goody powders without improvement of sxs. He has not been taking anything else. He reports improvement of his fever, nasal congestion, and ST. He has associated nausea without vomiting. He denies rash, vision changes. Slurred speech, CP, SOB, and pain, urinary sxs, or abnormal BMs. Pt states he has not been eating anything while sick.  Per chart review, pt has been seen in the ED multiple times for HAs.  Normal head CT in 2018 and 05/2018.   HPI  Past Medical History:  Diagnosis Date  . Allergic angiitis (HCC)   . Allergy   . Anxiety   . Headache 12/26/2015  . Multiple allergies   . Sickle cell trait [facility](HCC)     Patient Active Problem List   Diagnosis Date Noted  . Headache 12/26/2015  . Myalgia 12/11/2015    History reviewed. No pertinent surgical history.      Home Medications    Prior to Admission medications   Medication Sig Start Date End Date Taking? Authorizing Provider  amoxicillin (AMOXIL) 500 MG tablet Take 1 tablet (500 mg total) by mouth 3 (three) times daily. Patient not taking: Reported on 05/10/2018 04/09/18   Kem Boroughsriplett, Cari B, FNP  amoxicillin-clavulanate (AUGMENTIN) 875-125 MG tablet Take 1 tablet by mouth every 12 (twelve) hours. Patient not taking: Reported on 05/10/2018 02/03/18   Roxy HorsemanBrowning, Robert, PA-C  fluticasone [facility](FLONASE) 50 MCG/ACT nasal spray Place 1 spray into both nostrils daily. 05/23/18   Kentley Blyden, PA-C  hydrOXYzine  (ATARAX/VISTARIL) 25 MG tablet Take 1 tablet (25 mg total) by mouth every 6 (six) hours. Patient not taking: Reported on 05/10/2018 03/03/18   Roxy HorsemanBrowning, Robert, PA-C  meclizine (ANTIVERT) 25 MG tablet Take 1 tablet (25 mg total) by mouth 2 (two) times daily as needed for dizziness. Patient not taking: Reported on 02/02/2018 01/09/17   Shaune PollackIsaacs, Cameron, MD  meloxicam (MOBIC) 7.5 MG tablet Take 1 tablet (7.5 mg total) by mouth daily. Patient not taking: Reported on 02/02/2018 04/27/17   Ridhaan Dreibelbis, PA-C  naproxen (NAPROSYN) 500 MG tablet Take 1 tablet (500 mg total) by mouth 2 (two) times daily with a meal. Patient not taking: Reported on 05/10/2018 04/09/18   Kem Boroughsriplett, Cari B, FNP  omeprazole (PRILOSEC) 20 MG capsule Take 1 capsule (20 mg total) by mouth daily. Patient not taking: Reported on 02/02/2018 02/17/17   Antony MaduraHumes, Kelly, PA-C  ondansetron (ZOFRAN ODT) 4 MG disintegrating tablet Take 1 tablet (4 mg total) by mouth every 8 (eight) hours as needed for nausea. 05/19/18   Garlon HatchetSanders, Lisa M, PA-C  ondansetron (ZOFRAN) 4 MG tablet Take 1 tablet (4 mg total) by mouth every 6 (six) hours. Patient not taking: Reported on 02/02/2018 12/24/17   Jaynie CrumbleKirichenko, Tatyana, PA-C  oxymetazoline (AFRIN NASAL SPRAY) 0.05 % nasal spray Place 1 spray into both nostrils 2 (two) times daily for 3 days. 05/23/18 05/26/18  Clema Skousen, PA-C  PARoxetine (PAXIL) 20 MG tablet Take  1 tablet (20 mg total) by mouth every morning. Patient not taking: Reported on 02/02/2018 09/20/17   Bethann Berkshire, MD  penicillin v potassium (VEETID) 500 MG tablet Take 1 tablet (500 mg total) by mouth 3 (three) times daily. Patient not taking: Reported on 02/02/2018 12/24/17   Jaynie Crumble, PA-C  prochlorperazine (COMPAZINE) 10 MG tablet Take 1 tablet (10 mg total) by mouth every 8 (eight) hours as needed for nausea or vomiting. Patient not taking: Reported on 02/02/2018 01/09/17   Shaune Pollack, MD  sulfamethoxazole-trimethoprim (BACTRIM DS,SEPTRA  DS) 800-160 MG tablet Take 2 tablets by mouth 2 (two) times daily. Patient not taking: Reported on 05/19/2018 05/14/18   Irean Hong, MD  SUMAtriptan (IMITREX) 50 MG tablet Take 1 tablet (50 mg total) by mouth every 2 (two) hours as needed for migraine. May repeat in 2 hours if headache persists or recurs. Patient not taking: Reported on 02/02/2018 04/27/17   Moss Berry, PA-C  terbinafine (LAMISIL) 1 % cream Apply 1 application topically 2 (two) times daily. Patient not taking: Reported on 05/10/2018 05/09/18   Tommi Rumps, PA-C  topiramate (TOPAMAX) 25 MG tablet Take one tablet at night for one week, then take 2 tablets at night for one week, then take 3 tablets at night. Patient not taking: Reported on 02/02/2018 11/12/16   Trixie Dredge, PA-C    Family History Family History  Problem Relation Age of Onset  . Hypertension Mother   . Hypertension Maternal Grandmother   . Hypertension Paternal Grandmother   . CAD Other   . Hypertension Other   . Diabetes Other     Social History Social History   Tobacco Use  . Smoking status: Never Smoker  . Smokeless tobacco: Never Used  Substance Use Topics  . Alcohol use: Yes    Comment: socially  . Drug use: No     Allergies   Fish allergy; Reglan [metoclopramide]; and Tramadol   Review of Systems Review of Systems  HENT: Positive for sinus pressure and sinus pain.   Gastrointestinal: Positive for nausea.  Neurological: Positive for headaches.  All other systems reviewed and are negative.    Physical Exam Updated Vital Signs BP (!) 112/57 (BP Location: Right Arm)   Pulse 75   Temp 99.5 F (37.5 C) (Oral)   Resp 16   Ht 5\' 7"  (1.702 m)   Wt 90.7 kg   SpO2 98%   BMI 31.32 kg/m   Physical Exam  Constitutional: He is oriented to person, place, and time. He appears well-developed and well-nourished. No distress.  Sitting comfortably in the bed in NAD  HENT:  Head: Normocephalic and atraumatic.  Right Ear: Tympanic  membrane, external ear and ear canal normal.  Left Ear: Tympanic membrane, external ear and ear canal normal.  Nose: Mucosal edema present. Right sinus exhibits maxillary sinus tenderness. Left sinus exhibits maxillary sinus tenderness.  Mouth/Throat: Uvula is midline, oropharynx is clear and moist and mucous membranes are normal. No tonsillar exudate.  Nasal mucosal edema. TTP frontal and maxillary sinuses. OP clear without tonsillar swelling or exudate. Uvula midline with equal palate rise. TMs nonerythematous and not bulging bilaterally.   Eyes: Pupils are equal, round, and reactive to light. Conjunctivae and EOM are normal.  EOMI and PERRLA.   Neck: Normal range of motion.  Moving head easily without signs of meningismus.   Cardiovascular: Normal rate, regular rhythm and intact distal pulses.  Pulmonary/Chest: Effort normal and breath sounds normal. No respiratory distress. He  has no decreased breath sounds. He has no wheezes. He has no rhonchi. He has no rales.  Pt speaking in full sentences. Clear lung sounds in all fields.   Abdominal: Soft. He exhibits no distension. There is no tenderness.  Musculoskeletal: Normal range of motion.  Lymphadenopathy:    He has no cervical adenopathy.  Neurological: He is alert and oriented to person, place, and time. He displays normal reflexes. No sensory deficit.  No obvious neuro deficits. CN intact. Fine movement and coordination intact. Strength and sensation intact x4.  Pt ambulatory without difficulty  Skin: Skin is warm. Capillary refill takes less than 2 seconds.  Psychiatric: He has a normal mood and affect.  Nursing note and vitals reviewed.    ED Treatments / Results  Labs (all labs ordered are listed, but only abnormal results are displayed) Labs Reviewed - No data to display  EKG None  Radiology No results found.  Procedures Procedures (including critical care time)  Medications Ordered in ED Medications  ketorolac  (TORADOL) 15 MG/ML injection 15 mg (15 mg Intravenous Given 05/23/18 1815)  dexamethasone (DECADRON) injection 10 mg (10 mg Intravenous Given 05/23/18 1816)  ondansetron (ZOFRAN) injection 4 mg (4 mg Intravenous Given 05/23/18 1815)  sodium chloride 0.9 % bolus 1,000 mL (0 mLs Intravenous Stopped 05/23/18 1900)     Initial Impression / Assessment and Plan / ED Course  I have reviewed the triage vital signs and the nursing notes.  Pertinent labs & imaging results that were available during my care of the patient were reviewed by me and considered in my medical decision making (see chart for details).     Patient presenting for evaluation of headache.  Physical exam reassuring, no obvious neurologic deficits.  Patient has associated sinus symptoms including tenderness palpation of sinuses and nasal mucosal edema.  ?sinus headache.  As patient is without neurologic symptoms, and has had normal imaging in the past, will trial headache cocktail and reassess.  Doubt ICH, SAH, CVA, TIA, or infection.  Patient has mild low-grade fever which improved with medication.  This is likely stemming from his viral illness which is been present over the past several days.  No neck stiffness or signs of meningismus.  On reassessment, patient reports headache is improving.  He is tolerating p.o. without difficulty.  Discussed use of Afrin and Flonase with patient to help with sinus congestion.  Discussed use of NSAIDs as needed for headache.  Discussed importance of follow-up with PCP for further evaluation of his headaches.  At this time, patient pr appears safe for discharge.  Return precautions given.  Patient states he understands and agrees to plan.  Final Clinical Impressions(s) / ED Diagnoses   Final diagnoses:  Sinus headache    ED Discharge Orders         Ordered    fluticasone (FLONASE) 50 MCG/ACT nasal spray  Daily,   Status:  Discontinued     05/23/18 1842    ibuprofen (ADVIL,MOTRIN) 800 MG tablet   Every 8 hours PRN,   Status:  Discontinued     05/23/18 1842    fluticasone (FLONASE) 50 MCG/ACT nasal spray  Daily     05/23/18 1938    oxymetazoline (AFRIN NASAL SPRAY) 0.05 % nasal spray  2 times daily,   Status:  Discontinued     05/23/18 1938    oxymetazoline (AFRIN NASAL SPRAY) 0.05 % nasal spray  2 times daily     05/23/18 1940  Alveria Apley, PA-C 05/23/18 2045    Tegeler, Canary Brim, MD 05/24/18 [phone]

## 2018-05-23 NOTE — ED Notes (Signed)
Patient's mother arrived and asked to speak to APP.  APP came in to speak to patient's visitors and patient about his concerns

## 2018-05-23 NOTE — ED Notes (Signed)
Patient able to ambulate independently  

## 2018-05-23 NOTE — Discharge Instructions (Addendum)
Makes sure you are staying well hydrated with water.  Use flonase daily to help with nasal congestion. Take this until your symptoms improve.  Use afrin as needed for congestion/pressure/headache. Do not use for more than 3 days.  When you are having a headache, take 800 mg (4 over the counter pills) of ibuprofen to help. You may supplement with tylenol as needed.  Follow up with your primary care doctor to discuss your frequent headaches.  Return to the ER if you develop severe/persistent headache, vision changes, neck stiffness, fevers, weakness/numbness, or any new or concerning symptoms.

## 2018-05-23 NOTE — ED Notes (Signed)
This RN attempted IV x 1 without success.

## 2018-05-23 NOTE — ED Notes (Signed)
EMT provided patient with ginger ale and [place] sandwich, okay'd by APP

## 2018-06-09 ENCOUNTER — Ambulatory Visit (HOSPITAL_COMMUNITY)
Admission: EM | Admit: 2018-06-09 | Discharge: 2018-06-09 | Disposition: A | Discharge status: Home / Self Care | Payer: Self-pay | Attending: Emergency Medicine | Admitting: Emergency Medicine

## 2018-06-09 ENCOUNTER — Other Ambulatory Visit: Payer: Self-pay

## 2018-06-09 ENCOUNTER — Encounter (HOSPITAL_COMMUNITY): Payer: Self-pay | Admitting: Emergency Medicine

## 2018-06-09 DIAGNOSIS — F411 Generalized anxiety disorder: Secondary | ICD-10-CM

## 2018-06-09 DIAGNOSIS — R110 Nausea: Secondary | ICD-10-CM

## 2018-06-09 MED ORDER — HYDROXYZINE HCL 25 MG PO TABS: 25.0000 mg | ORAL_TABLET | Freq: Four times a day (QID) | ORAL | 0 refills | Status: DC

## 2018-06-09 NOTE — Discharge Instructions (Signed)
We are not able to give any xanax for anxiety offered lexapro, you discussed that you did not want to take this.  Referred you to obtain a pcp  You will need to see a reg provider for any further medications we are not able to monitor you on the other meds you are requesting.  Go to the ER for any chest pain or sob

## 2018-06-09 NOTE — ED Triage Notes (Signed)
Seen by provider

## 2018-06-09 NOTE — ED Provider Notes (Signed)
[facility]    CSN: [id number] Arrival date & time: 06/09/18  1954     History   Chief Complaint Chief Complaint  Patient presents with  . Abdominal Pain    HPI Larry Estes is a 27 y.o. male.   Pt came in for nausea and anxiety. States that he was taking xanax 2mg  TID and lexapro then lost his job and now has no insurance to refill medications or to obtain a pcp. Has been off these meds for 2 months. Pt anxious while on table. Denies any SI/HI states that he does drink ETOH at times to help with this. NO v/d, pt is alert states that he has not taken anything today. States that he does not want to be back on lexapro but would like xanax or hydroxizine.      Past Medical History:  Diagnosis Date  . Allergic angiitis (HCC)   . Allergy   . Anxiety   . Headache 12/26/2015  . Multiple allergies   . Sickle cell trait [facility])     Patient Active Problem List   Diagnosis Date Noted  . Headache 12/26/2015  . Myalgia 12/11/2015    No past surgical history on file.     Home Medications    Prior to Admission medications   Medication Sig Start Date End Date Taking? Authorizing Provider  amoxicillin (AMOXIL) 500 MG tablet Take 1 tablet (500 mg total) by mouth 3 (three) times daily. Patient not taking: Reported on 05/10/2018 04/09/18   Kem Boroughs B, FNP  amoxicillin-clavulanate (AUGMENTIN) 875-125 MG tablet Take 1 tablet by mouth every 12 (twelve) hours. Patient not taking: Reported on 05/10/2018 02/03/18   Roxy Horseman, PA-C  fluticasone [facility]) 50 MCG/ACT nasal spray Place 1 spray into both nostrils daily. 05/23/18   Caccavale, Sophia, PA-C  hydrOXYzine (ATARAX/VISTARIL) 25 MG tablet Take 1 tablet (25 mg total) by mouth every 6 (six) hours. 06/09/18   Coralyn Mark, NP  meclizine (ANTIVERT) 25 MG tablet Take 1 tablet (25 mg total) by mouth 2 (two) times daily as needed for dizziness. Patient not taking: Reported on 02/02/2018 01/09/17   Shaune Pollack, MD    meloxicam (MOBIC) 7.5 MG tablet Take 1 tablet (7.5 mg total) by mouth daily. Patient not taking: Reported on 02/02/2018 04/27/17   Caccavale, Sophia, PA-C  naproxen (NAPROSYN) 500 MG tablet Take 1 tablet (500 mg total) by mouth 2 (two) times daily with a meal. Patient not taking: Reported on 05/10/2018 04/09/18   Kem Boroughs B, FNP  omeprazole (PRILOSEC) 20 MG capsule Take 1 capsule (20 mg total) by mouth daily. Patient not taking: Reported on 02/02/2018 02/17/17   Antony Madura, PA-C  ondansetron (ZOFRAN ODT) 4 MG disintegrating tablet Take 1 tablet (4 mg total) by mouth every 8 (eight) hours as needed for nausea. 05/19/18   Garlon Hatchet, PA-C  ondansetron (ZOFRAN) 4 MG tablet Take 1 tablet (4 mg total) by mouth every 6 (six) hours. Patient not taking: Reported on 02/02/2018 12/24/17   Jaynie Crumble, PA-C  PARoxetine (PAXIL) 20 MG tablet Take 1 tablet (20 mg total) by mouth every morning. Patient not taking: Reported on 02/02/2018 09/20/17   Bethann Berkshire, MD  penicillin v potassium (VEETID) 500 MG tablet Take 1 tablet (500 mg total) by mouth 3 (three) times daily. Patient not taking: Reported on 02/02/2018 12/24/17   Jaynie Crumble, PA-C  prochlorperazine (COMPAZINE) 10 MG tablet Take 1 tablet (10 mg total) by mouth every 8 (eight) hours as  needed for nausea or vomiting. Patient not taking: Reported on 02/02/2018 01/09/17   Shaune Pollack, MD  sulfamethoxazole-trimethoprim (BACTRIM DS,SEPTRA DS) 800-160 MG tablet Take 2 tablets by mouth 2 (two) times daily. Patient not taking: Reported on 05/19/2018 05/14/18   Irean Hong, MD  SUMAtriptan (IMITREX) 50 MG tablet Take 1 tablet (50 mg total) by mouth every 2 (two) hours as needed for migraine. May repeat in 2 hours if headache persists or recurs. Patient not taking: Reported on 02/02/2018 04/27/17   Caccavale, Sophia, PA-C  terbinafine (LAMISIL) 1 % cream Apply 1 application topically 2 (two) times daily. Patient not taking: Reported on 05/10/2018  05/09/18   Tommi Rumps, PA-C  topiramate (TOPAMAX) 25 MG tablet Take one tablet at night for one week, then take 2 tablets at night for one week, then take 3 tablets at night. Patient not taking: Reported on 02/02/2018 11/12/16   Trixie Dredge, PA-C    Family History Family History  Problem Relation Age of Onset  . Hypertension Mother   . Hypertension Maternal Grandmother   . Hypertension Paternal Grandmother   . CAD Other   . Hypertension Other   . Diabetes Other     Social History Social History   Tobacco Use  . Smoking status: Never Smoker  . Smokeless tobacco: Never Used  Substance Use Topics  . Alcohol use: Yes    Comment: socially  . Drug use: No     Allergies   Fish allergy; Reglan [metoclopramide]; and Tramadol   Review of Systems Review of Systems  Constitutional: Positive for appetite change.  HENT: Negative.   Eyes: Negative.   Respiratory: Negative.   Cardiovascular: Negative.   Gastrointestinal: Positive for nausea.  Genitourinary: Negative.   Musculoskeletal: Negative.   Skin: Negative.   Neurological: Negative.   Psychiatric/Behavioral: The patient is nervous/anxious.      Physical Exam Triage Vital Signs ED Triage Vitals [06/09/18 2023]  Enc Vitals Group     BP (!) 138/96     Pulse Rate 69     Resp 18     Temp 98 F (36.7 C)     Temp Source Oral     SpO2 97 %     Weight      Height      Head Circumference      Peak Flow      Pain Score      Pain Loc      Pain Edu?      Excl. in GC?    No data found.  Updated Vital Signs BP (!) 138/96 (BP Location: Left Arm) Comment (BP Location): large cuff  Pulse 69   Temp 98 F (36.7 C) (Oral)   Resp 18   SpO2 97%   Visual Acuity     Physical Exam  Constitutional: He appears well-developed and well-nourished.  Cardiovascular: Normal rate.  Pulmonary/Chest: Effort normal.  Abdominal: Soft. Normal appearance and bowel sounds are normal. There is no tenderness. There is no rebound, no  guarding, no tenderness at McBurney's point and negative Murphy's sign.  Neurological: He is alert.  Skin: Skin is warm. Capillary refill takes less than 2 seconds.  Psychiatric:  Is calm while talking then pt will be to become anxious when talking about him wanting medications.      UC Treatments / Results  Labs (all labs ordered are listed, but only abnormal results are displayed) Labs Reviewed - No data to display  EKG None  Radiology  No results found.  Procedures Procedures (including critical care time)  Medications Ordered in UC Medications - No data to display  Initial Impression / Assessment and Plan / UC Course  I have reviewed the triage vital signs and the nursing notes.  Pertinent labs & imaging results that were available during my care of the patient were reviewed by me and considered in my medical decision making (see chart for details).     We are not able to give any xanax for anxiety offered lexapro, you discussed that you did not want to take this.  Referred you to obtain a pcp  You will need to see a reg provider for any further medications we are not able to monitor you on the other meds you are requesting.  Go to the ER for any chest pain or sob   Or any signs of SI/HI pt denies any self harm thoughts. States that he just has a  16 week old son and it is causing more anxiety.  Expressed to pt that we will not be able to cont to refill medications for this and he will need to see a pcp to be monitored and place on the correct medications.  Reviewed pts records and medications  Final diagnoses:  Generalized anxiety disorder  Nausea without vomiting   Discharge Instructions   None    ED Prescriptions    Medication Sig Dispense Auth. Provider   hydrOXYzine (ATARAX/VISTARIL) 25 MG tablet Take 1 tablet (25 mg total) by mouth every 6 (six) hours. 12 tablet Coralyn Mark, NP     Controlled Substance Prescriptions [place] Controlled Substance Registry  consulted? Not Applicable   Coralyn Mark, NP 06/10/18 [phone]

## 2018-08-26 IMAGING — DX DG CHEST 2V
2 series · 2 of 2 positions shown · non-contrast
Comparison: 10/14/2016

CLINICAL DATA: Left side non radiating chest pain

EXAM:
CHEST  2 VIEW

[chest pa]
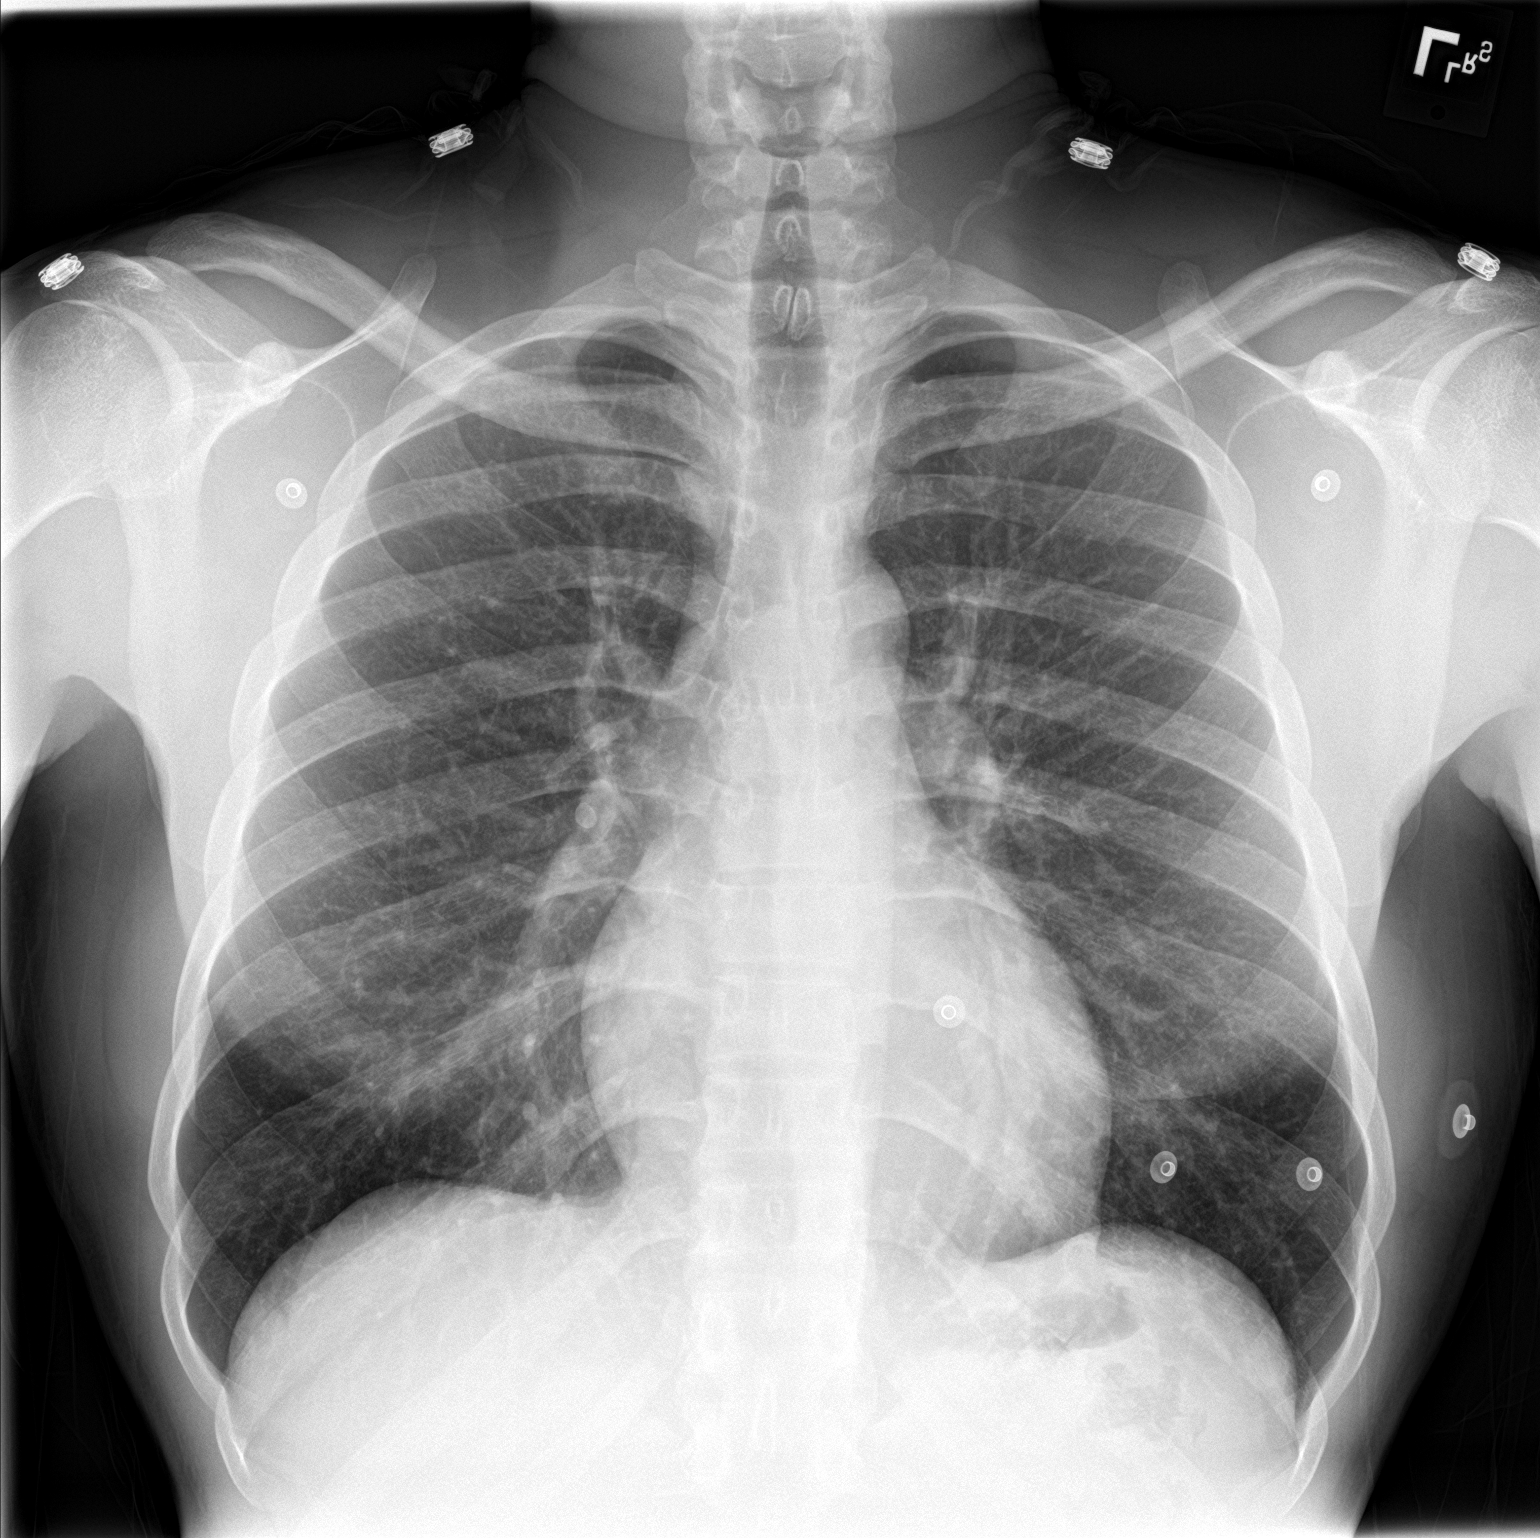

[chest lat]
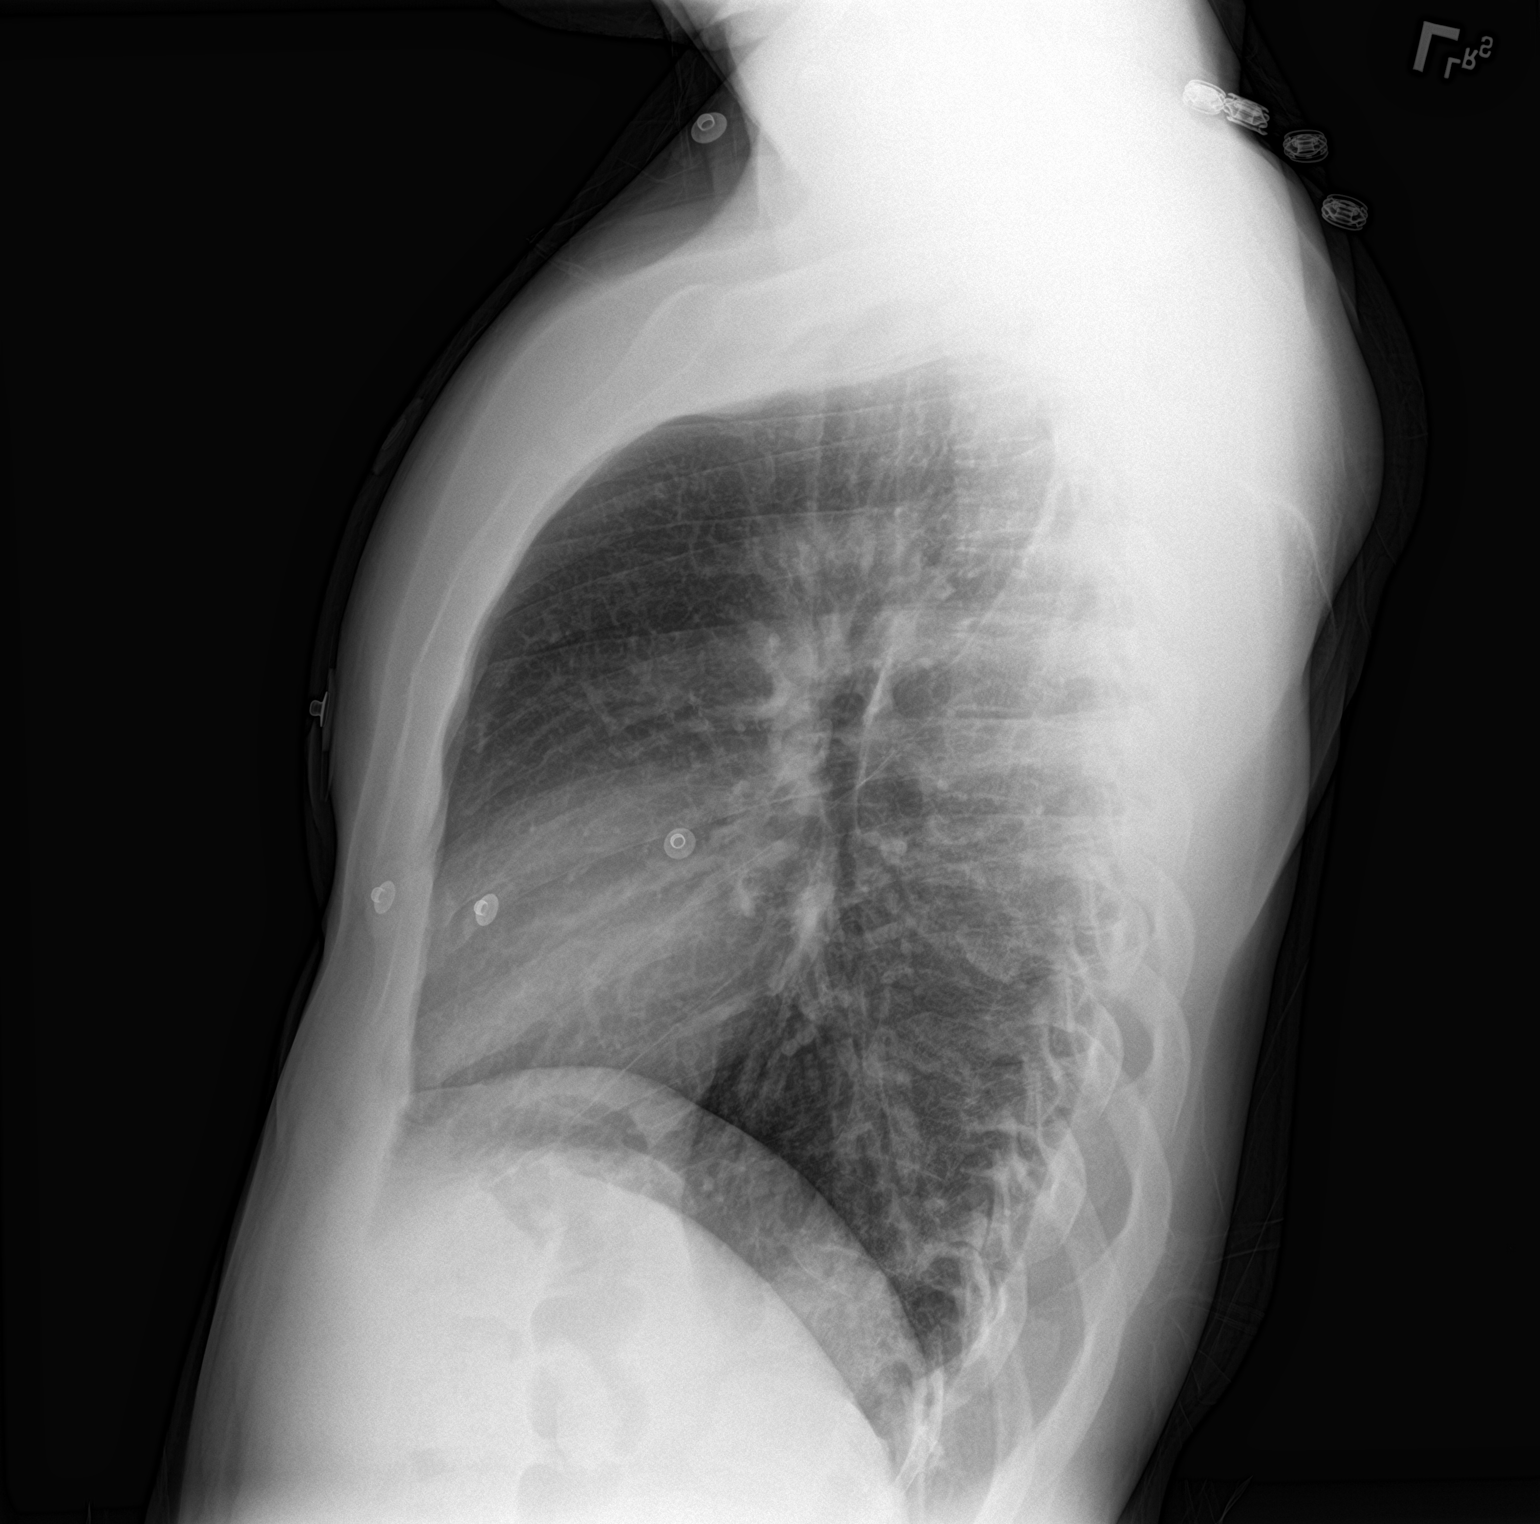

[2 of 2 positions shown; findings below may reference images not displayed]

FINDINGS: The heart size and mediastinal contours are within normal limits.
Both lungs are clear. The visualized skeletal structures are
unremarkable.
IMPRESSION: No active cardiopulmonary disease.

## 2018-08-31 IMAGING — CR DG FOOT COMPLETE 3+V*R*
3 series · 3 of 3 positions shown · non-contrast
Comparison: None.

CLINICAL DATA: Dropped box onto right foot [REDACTED]. Right foot
pain. Initial encounter.

EXAM:
RIGHT FOOT COMPLETE - 3+ VIEW

[x foot ap right]
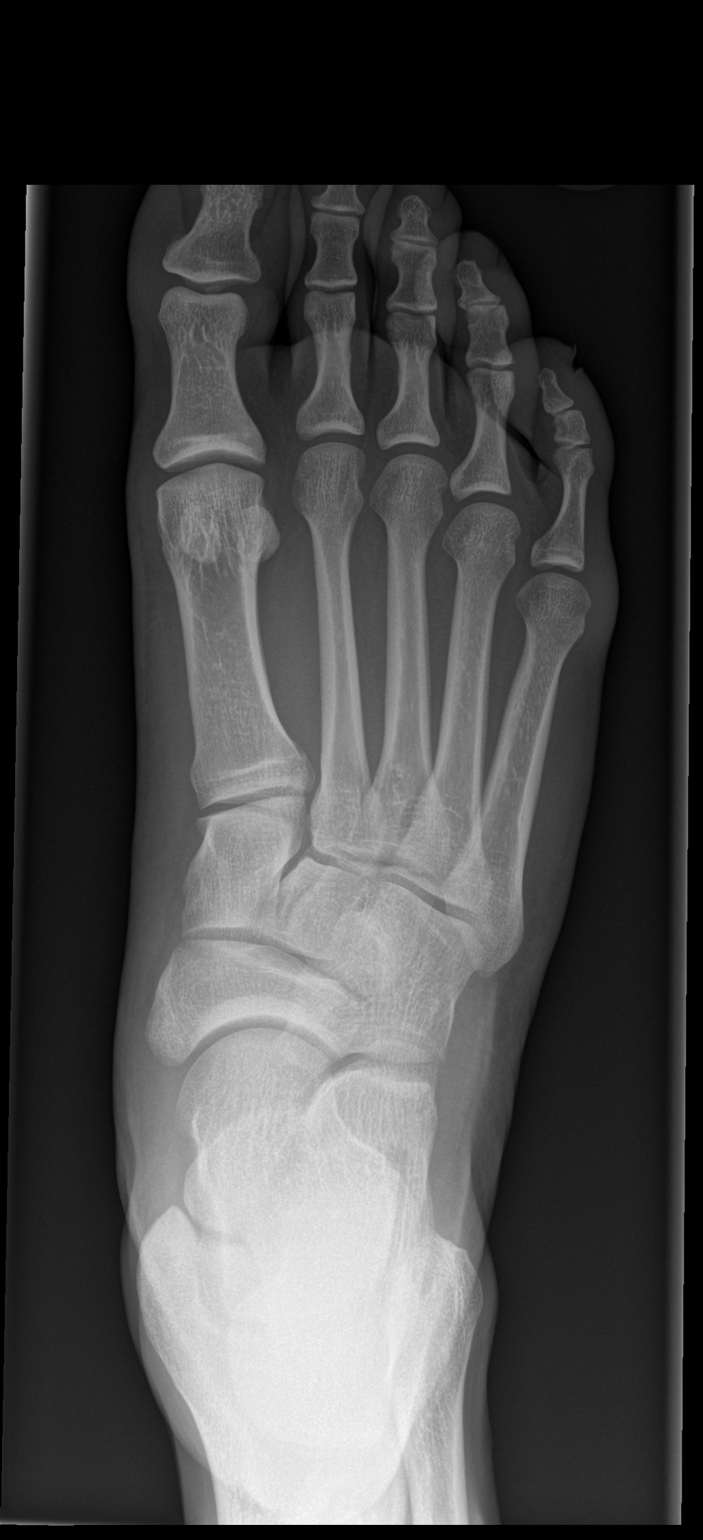

[x foot obl right]
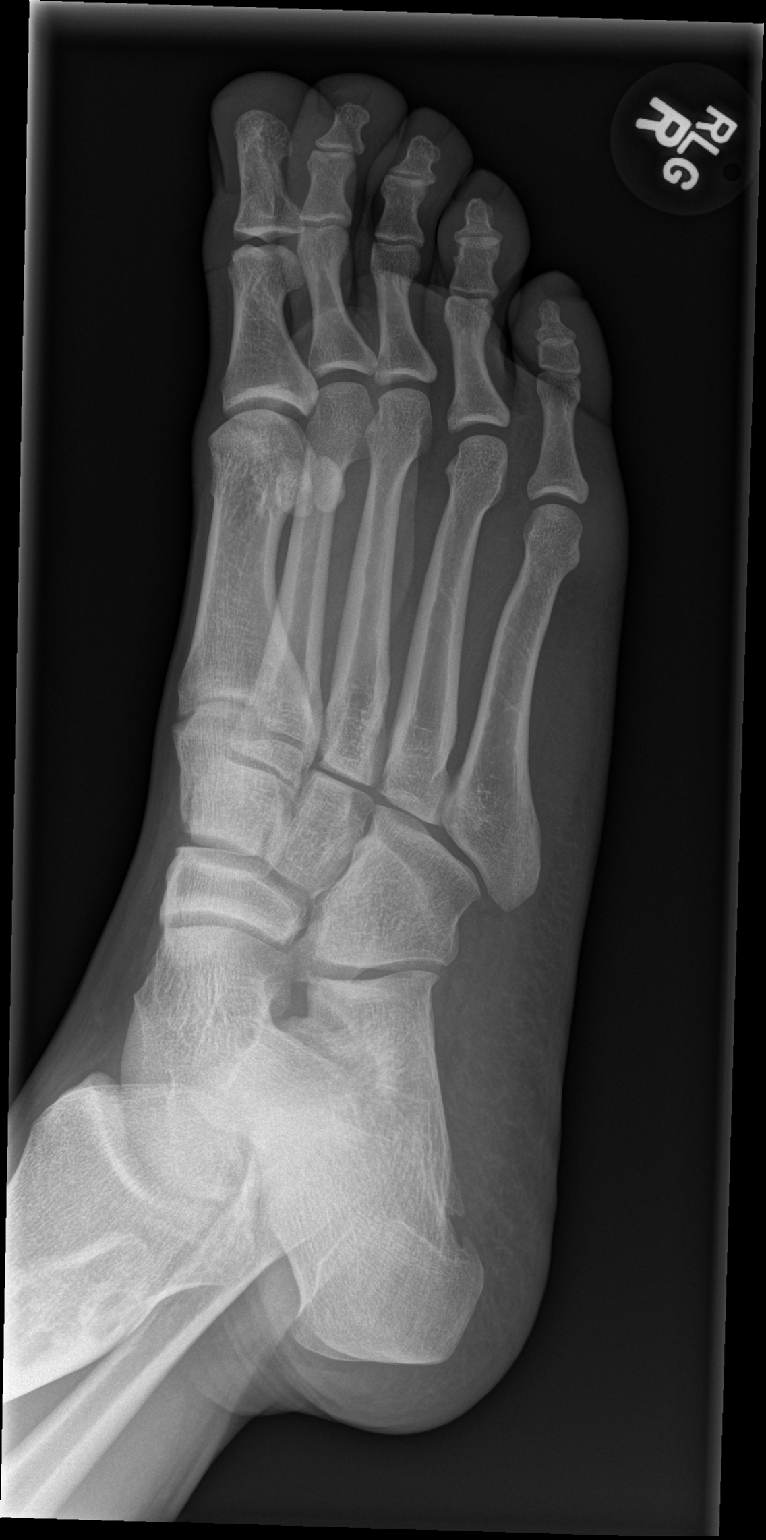

[x foot lat right]
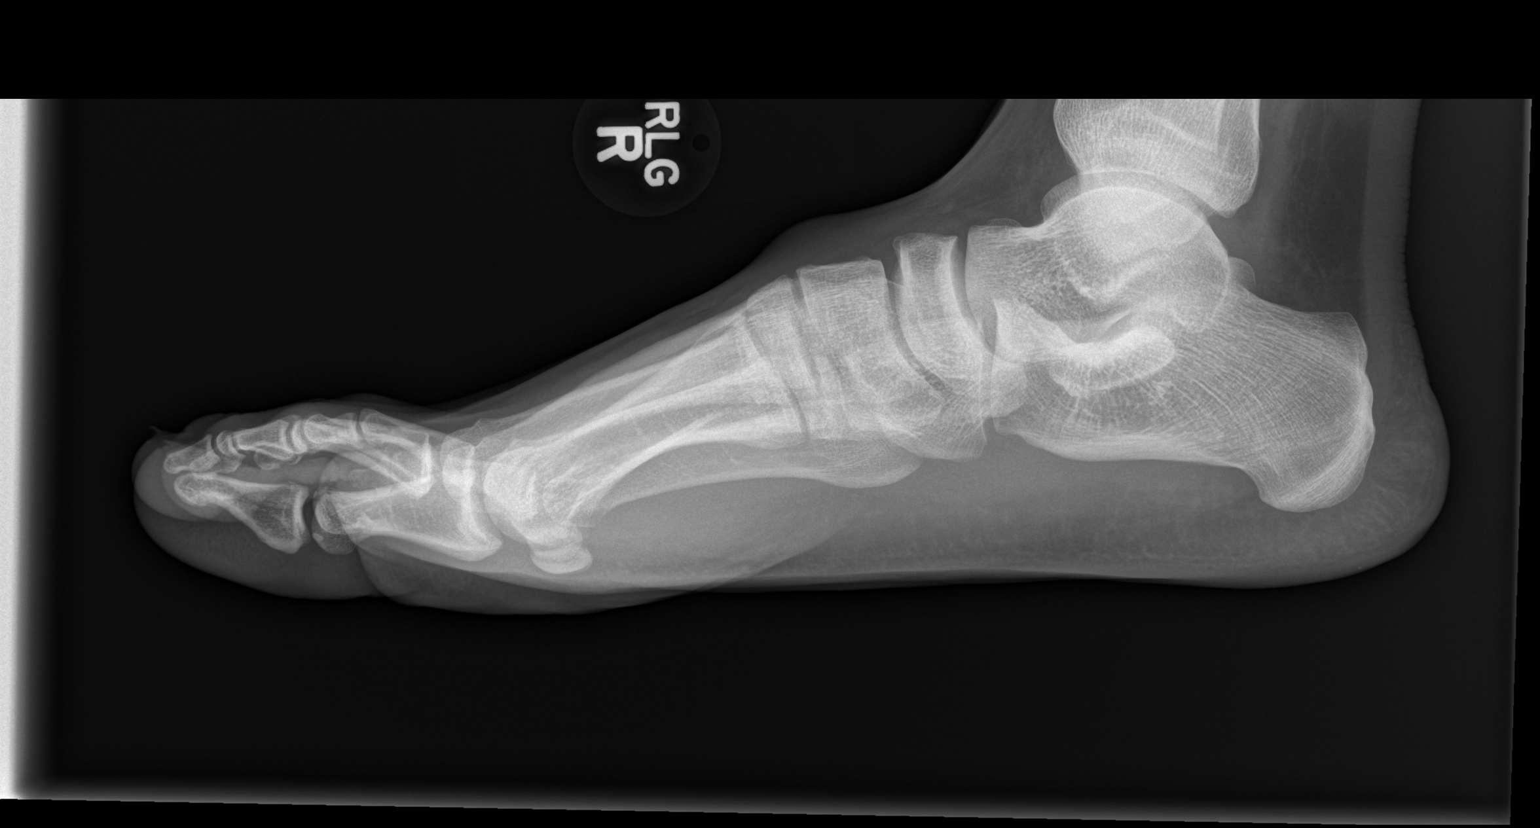

[3 of 3 positions shown; findings below may reference images not displayed]

FINDINGS: Negative for acute fracture or malalignment. Dorsal midfoot soft
tissue swelling without opaque foreign body.

Benign cortically based peripherally sclerotic lesion in the distal
tibia which was more completely visualized on 3552 ankle film. No
detectable change since that time.
IMPRESSION: No acute osseous finding.

## 2018-09-11 ENCOUNTER — Encounter (HOSPITAL_COMMUNITY): Payer: Self-pay | Admitting: Emergency Medicine

## 2018-09-11 ENCOUNTER — Emergency Department (HOSPITAL_COMMUNITY)
Admission: EM | Admit: 2018-09-11 | Discharge: 2018-09-12 | Disposition: A | Discharge status: Home / Self Care | Payer: Self-pay | Attending: Emergency Medicine | Admitting: Emergency Medicine

## 2018-09-11 ENCOUNTER — Other Ambulatory Visit: Payer: Self-pay

## 2018-09-11 DIAGNOSIS — Z79899 Other long term (current) drug therapy: Secondary | ICD-10-CM | POA: Insufficient documentation

## 2018-09-11 DIAGNOSIS — R059 Cough, unspecified: Secondary | ICD-10-CM

## 2018-09-11 DIAGNOSIS — R0789 Other chest pain: Secondary | ICD-10-CM | POA: Insufficient documentation

## 2018-09-11 DIAGNOSIS — R05 Cough: Secondary | ICD-10-CM | POA: Insufficient documentation

## 2018-09-11 DIAGNOSIS — Z711 Person with feared health complaint in whom no diagnosis is made: Secondary | ICD-10-CM

## 2018-09-11 NOTE — ED Triage Notes (Signed)
Patient complaining of coughing, fever, nausea, fatigue, and body aches. Patient states it started 9 days ago. Patient states that he has gotten worse. Patient is also complaining of chest pain.

## 2018-09-12 LAB — HIV ANTIBODY (ROUTINE TESTING W REFLEX): HIV Screen 4th Generation wRfx: NONREACTIVE

## 2018-09-12 LAB — RAPID HIV SCREEN (HIV 1/2 AB+AG)
HIV 1/2 Antibodies: NONREACTIVE
HIV-1 P24 ANTIGEN - HIV24: NONREACTIVE

## 2018-09-12 NOTE — ED Provider Notes (Signed)
[facility]-EMERGENCY DEPT Provider Note  CSN: [id number] Arrival date & time: 09/11/18 2248  Chief Complaint(s) Cough; Headache; and Chest Pain  HPI Alee Korb is a 27 y.o. male who presents to the emergency department with chief complaints of intermittent coughing, headache, chest pain.  However during my assessment patient reported that these were not his major concerns.  He states that he currently does not have any headache or chest pain.  States that these are intermittent in nature and have been occurring for "a while."  He is here to be assessed for possible STDs.  States that he has been sleeping with several women and not protecting himself.  He denies any discharge or dysuria.  Denies any testicular pain.  No abdominal pain.  HPI  Past Medical History Past Medical History:  Diagnosis Date  . Allergic angiitis (HCC)   . Allergy   . Anxiety   . Headache 12/26/2015  . Multiple allergies   . Sickle cell trait [facility])    Patient Active Problem List   Diagnosis Date Noted  . Headache 12/26/2015  . Myalgia 12/11/2015   Home Medication(s) Prior to Admission medications   Medication Sig Start Date End Date Taking? Authorizing Provider  amoxicillin (AMOXIL) 500 MG tablet Take 1 tablet (500 mg total) by mouth 3 (three) times daily. Patient not taking: Reported on 05/10/2018 04/09/18   Kem Boroughs B, FNP  fluticasone (FLONASE) 50 MCG/ACT nasal spray Place 1 spray into both nostrils daily. Patient not taking: Reported on 09/12/2018 05/23/18   Caccavale, Sophia, PA-C  hydrOXYzine (ATARAX/VISTARIL) 25 MG tablet Take 1 tablet (25 mg total) by mouth every 6 (six) hours. Patient not taking: Reported on 09/12/2018 06/09/18   Coralyn Mark, NP  meloxicam (MOBIC) 7.5 MG tablet Take 1 tablet (7.5 mg total) by mouth daily. Patient not taking: Reported on 02/02/2018 04/27/17   Caccavale, Sophia, PA-C  naproxen (NAPROSYN) 500 MG tablet Take 1 tablet (500 mg total) by mouth  2 (two) times daily with a meal. Patient not taking: Reported on 05/10/2018 04/09/18   Kem Boroughs B, FNP  omeprazole (PRILOSEC) 20 MG capsule Take 1 capsule (20 mg total) by mouth daily. Patient not taking: Reported on 02/02/2018 02/17/17   Antony Madura, PA-C  ondansetron (ZOFRAN ODT) 4 MG disintegrating tablet Take 1 tablet (4 mg total) by mouth every 8 (eight) hours as needed for nausea. Patient not taking: Reported on 09/12/2018 05/19/18   Garlon Hatchet, PA-C  PARoxetine (PAXIL) 20 MG tablet Take 1 tablet (20 mg total) by mouth every morning. Patient not taking: Reported on 02/02/2018 09/20/17   Bethann Berkshire, MD  sulfamethoxazole-trimethoprim (BACTRIM DS,SEPTRA DS) 800-160 MG tablet Take 2 tablets by mouth 2 (two) times daily. Patient not taking: Reported on 05/19/2018 05/14/18   Irean Hong, MD  terbinafine (LAMISIL) 1 % cream Apply 1 application topically 2 (two) times daily. Patient not taking: Reported on 05/10/2018 05/09/18   Tommi Rumps, PA-C  topiramate (TOPAMAX) 25 MG tablet Take one tablet at night for one week, then take 2 tablets at night for one week, then take 3 tablets at night. Patient not taking: Reported on 02/02/2018 11/12/16   Trixie Dredge, [place]  Past Surgical History History reviewed. No pertinent surgical history. Family History Family History  Problem Relation Age of Onset  . Hypertension Mother   . Hypertension Maternal Grandmother   . Hypertension Paternal Grandmother   . CAD Other   . Hypertension Other   . Diabetes Other     Social History Social History   Tobacco Use  . Smoking status: Never Smoker  . Smokeless tobacco: Never Used  Substance Use Topics  . Alcohol use: Yes    Comment: socially  . Drug use: No   Allergies Fish allergy; Reglan [metoclopramide]; and Tramadol  Review of Systems Review of Systems All other  systems are reviewed and are negative for acute change except as noted in the HPI  Physical Exam Vital Signs  I have reviewed the triage vital signs BP 106/67   Pulse 66   Temp 98 F (36.7 C) (Oral)   Resp 18   Ht 5\' 7"  (1.702 m)   Wt 113.4 kg   SpO2 99%   BMI 39.16 kg/m   Physical Exam  Constitutional: He is oriented to person, place, and time. He appears well-developed and well-nourished. No distress.  HENT:  Head: Normocephalic and atraumatic.  Nose: Nose normal.  Eyes: Pupils are equal, round, and reactive to light. Conjunctivae and EOM are normal. Right eye exhibits no discharge. Left eye exhibits no discharge. No scleral icterus.  Neck: Normal range of motion. Neck supple.  Cardiovascular: Normal rate and regular rhythm. Exam reveals no gallop and no friction rub.  No murmur heard. Pulmonary/Chest: Effort normal and breath sounds normal. No stridor. No respiratory distress. He has no rales.  Abdominal: Soft. He exhibits no distension. There is no tenderness.  Genitourinary:  Genitourinary Comments: Patient declined testicular exam  Musculoskeletal: He exhibits no edema or tenderness.  Neurological: He is alert and oriented to person, place, and time.  Skin: Skin is warm and dry. No rash noted. He is not diaphoretic. No erythema.  Psychiatric: He has a normal mood and affect.  Vitals reviewed.   ED Results and Treatments Labs (all labs ordered are listed, but only abnormal results are displayed) Labs Reviewed  HIV ANTIBODY (ROUTINE TESTING W REFLEX)  RAPID HIV SCREEN (HIV 1/2 AB+AG)  GC/CHLAMYDIA PROBE AMP ([place]) NOT AT [facility]                                                                                                                         EKG  EKG Interpretation  Date/Time:  Sunday September 12 2018 00:52:40 EST Ventricular Rate:  75 PR Interval:    QRS Duration: 92 QT Interval:  362 QTC Calculation: 405 R Axis:   67 Text Interpretation:  Sinus  rhythm NO STEMI No significant change was found Confirmed by Drema Pry ([id number]) on 09/12/2018 1:21:43 AM      Radiology No results found. Pertinent labs & imaging results that were available during my care of the patient were reviewed by me and  considered in my medical decision making (see chart for details).  Medications Ordered in ED Medications - No data to display                                                                                                                                  Procedures Procedures  (including critical care time)  Medical Decision Making / ED Course I have reviewed the nursing notes for this encounter and the patient's prior records (if available in EHR or on provided paperwork).    1. Chest pain. Highly atypical chest pain inconsistent with ACS.  Most consistent with chest wall pain.  EKG without acute ischemic changes or evidence of pericarditis.  Lungs clear to auscultation bilaterally.   Doubt pulmonary embolism.  Not classic for aortic dissection or esophageal perforation.  Currently asymptomatic.  No need for further work-up at this time.  2.  Headache.  Currently asymptomatic.  Exam nonfocal  3.  Cough.   Patient has not coughed during his stay in my evaluation.  Department with pain thanks for calling mom start a call you so late I have Mr. 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  Despite best efforts to proofread,  errors can occur which can change the documentation meaning.   Nira Connardama, Lavonta Tillis Eduardo, MD 09/12/18 [phone]

## 2018-09-12 NOTE — ED Notes (Signed)
Pt DC'd and left prior to be able to be told test results resulted today.  Pt met with registration prior to departure to discuss how to access his results online and to add phone number for contact regarding test results.  Pt aware that test results will take additional time, being sent out, prior to resulting.

## 2018-09-12 NOTE — Discharge Instructions (Addendum)
You will be contacted if your tests are positive.  We can also check the results on MyChart.

## 2018-09-13 LAB — GC/CHLAMYDIA PROBE AMP (~~LOC~~) NOT AT ARMC
Chlamydia: NEGATIVE
NEISSERIA GONORRHEA: NEGATIVE

## 2018-10-04 ENCOUNTER — Encounter (HOSPITAL_COMMUNITY): Payer: Self-pay | Admitting: *Deleted

## 2018-10-04 ENCOUNTER — Emergency Department (HOSPITAL_COMMUNITY): Payer: Self-pay

## 2018-10-04 ENCOUNTER — Emergency Department (HOSPITAL_COMMUNITY)
Admission: EM | Admit: 2018-10-04 | Discharge: 2018-10-04 | Disposition: A | Discharge status: Home / Self Care | Payer: Self-pay | Attending: Emergency Medicine | Admitting: Emergency Medicine

## 2018-10-04 DIAGNOSIS — Z5321 Procedure and treatment not carried out due to patient leaving prior to being seen by health care provider: Secondary | ICD-10-CM | POA: Insufficient documentation

## 2018-10-04 DIAGNOSIS — R1110 Vomiting, unspecified: Secondary | ICD-10-CM | POA: Insufficient documentation

## 2018-10-04 DIAGNOSIS — R0602 Shortness of breath: Secondary | ICD-10-CM | POA: Insufficient documentation

## 2018-10-04 DIAGNOSIS — R079 Chest pain, unspecified: Secondary | ICD-10-CM | POA: Insufficient documentation

## 2018-10-04 DIAGNOSIS — M791 Myalgia, unspecified site: Secondary | ICD-10-CM | POA: Insufficient documentation

## 2018-10-04 DIAGNOSIS — R51 Headache: Secondary | ICD-10-CM | POA: Insufficient documentation

## 2018-10-04 LAB — BASIC METABOLIC PANEL
Anion gap: 10 (ref 5–15)
BUN: 6 mg/dL (ref 6–20)
CHLORIDE: 105 mmol/L (ref 98–111)
CO2: 26 mmol/L (ref 22–32)
Calcium: 9 mg/dL (ref 8.9–10.3)
Creatinine, Ser: 0.99 mg/dL (ref 0.61–1.24)
GFR calc Af Amer: >60 mL/min (ref >60)
GFR calc non Af Amer: >60 mL/min (ref >60)
Glucose, Bld: 85 mg/dL (ref 70–99)
Potassium: 3.8 mmol/L (ref 3.5–5.1)
SODIUM: 141 mmol/L (ref 135–145)

## 2018-10-04 LAB — CBC
HCT: 42.5 % (ref 39.0–52.0)
Hemoglobin: 13.9 g/dL (ref 13.0–17.0)
MCH: 25.8 pg — ABNORMAL LOW (ref 26.0–34.0)
MCHC: 32.7 g/dL (ref 30.0–36.0)
MCV: 78.8 fL — ABNORMAL LOW (ref 80.0–100.0)
NRBC: 0 % (ref 0.0–0.2)
Platelets: 243 10*3/uL (ref 150–400)
RBC: 5.39 MIL/uL (ref 4.22–5.81)
RDW: 13.6 % (ref 11.5–15.5)
WBC: 4.9 10*3/uL (ref 4.0–10.5)

## 2018-10-04 LAB — POCT I-STAT TROPONIN I: Troponin i, poc: 0.03 ng/mL (ref 0.00–0.08)

## 2018-10-04 NOTE — ED Notes (Signed)
Patient is not answering to come back to a room.

## 2018-10-04 NOTE — ED Triage Notes (Signed)
Pt complains of diarrhea, cough, vomiting, headache for the past 3 weeks.

## 2019-01-27 NOTE — Progress Notes (Signed)
COVID Hotel Screening performed. Temperature, PHQ-9, and need for medical care and medications assessed. Patient reports no additional needs at this time.  Carlyle Basques RN MSN

## 2020-03-29 ENCOUNTER — Other Ambulatory Visit: Payer: Self-pay

## 2020-03-29 ENCOUNTER — Encounter (HOSPITAL_COMMUNITY): Payer: Self-pay

## 2020-03-29 ENCOUNTER — Emergency Department (HOSPITAL_COMMUNITY)
Admission: EM | Admit: 2020-03-29 | Discharge: 2020-03-29 | Disposition: A | Discharge status: Home / Self Care | Payer: Medicaid Other | Attending: Emergency Medicine | Admitting: Emergency Medicine

## 2020-03-29 DIAGNOSIS — K0889 Other specified disorders of teeth and supporting structures: Secondary | ICD-10-CM | POA: Insufficient documentation

## 2020-03-29 MED ORDER — NAPROXEN 500 MG PO TABS
500.0000 mg | ORAL_TABLET | Freq: Once | ORAL | Status: DC
  Filled 2020-03-29: qty 1

## 2020-03-29 MED ORDER — AMOXICILLIN-POT CLAVULANATE 875-125 MG PO TABS: 1.0000 | ORAL_TABLET | Freq: Two times a day (BID) | ORAL | 0 refills | Status: AC

## 2020-03-29 MED ORDER — KETOROLAC TROMETHAMINE 15 MG/ML IJ SOLN
15.0000 mg | Freq: Once | INTRAMUSCULAR | Status: AC
  Administered 2020-03-29: 15 mg via INTRAMUSCULAR
  Filled 2020-03-29: qty 1

## 2020-03-29 MED ORDER — NAPROXEN 500 MG PO TABS: 500.0000 mg | ORAL_TABLET | Freq: Two times a day (BID) | ORAL | 0 refills | Status: AC

## 2020-03-29 MED ORDER — LIDOCAINE HCL (PF) 1 % IJ SOLN
5.0000 mL | Freq: Once | INTRAMUSCULAR | Status: AC
  Administered 2020-03-29: 5 mL
  Filled 2020-03-29: qty 30

## 2020-03-29 MED ORDER — AMOXICILLIN-POT CLAVULANATE 875-125 MG PO TABS
1.0000 | ORAL_TABLET | Freq: Once | ORAL | Status: AC
  Administered 2020-03-29: 1 via ORAL
  Filled 2020-03-29: qty 1

## 2020-03-29 NOTE — ED Provider Notes (Addendum)
[facility]   CSN: [id number] Arrival date & time: 03/29/20  0033     History Chief Complaint  Patient presents with  . Dental Pain    Larry Estes is a 29 y.o. male with a history of sickle cell trait, anxiety, and headaches who presents to the emergency department with complaints of dental pain over the past few days.  Patient states he is having pain to the left upper and right lower gumline that is constant, severe, no alleviating or aggravating factors.  Tried Tylenol without much relief.  He denies fever, chills, intraoral drainage, neck pain, difficulty swallowing, dyspnea, or vomiting.  HPI     Past Medical History:  Diagnosis Date  . Allergic angiitis (HCC)   . Allergy   . Anxiety   . Headache 12/26/2015  . Multiple allergies   . Sickle cell trait [facility])     Patient Active Problem List   Diagnosis Date Noted  . Headache 12/26/2015  . Myalgia 12/11/2015    History reviewed. No pertinent surgical history.     Family History  Problem Relation Age of Onset  . Hypertension Mother   . Hypertension Maternal Grandmother   . Hypertension Paternal Grandmother   . CAD Other   . Hypertension Other   . Diabetes Other     Social History   Tobacco Use  . Smoking status: Never Smoker  . Smokeless tobacco: Never Used  Vaping Use  . Vaping Use: Never assessed  Substance Use Topics  . Alcohol use: Yes    Comment: socially  . Drug use: No    Home Medications Prior to Admission medications   Medication Sig Start Date End Date Taking? Authorizing Provider  amoxicillin (AMOXIL) 500 MG tablet Take 1 tablet (500 mg total) by mouth 3 (three) times daily. Patient not taking: Reported on 05/10/2018 04/09/18   Kem Boroughs B, FNP  fluticasone (FLONASE) 50 MCG/ACT nasal spray Place 1 spray into both nostrils daily. Patient not taking: Reported on 09/12/2018 05/23/18   Caccavale, Sophia, PA-C  hydrOXYzine (ATARAX/VISTARIL)  25 MG tablet Take 1 tablet (25 mg total) by mouth every 6 (six) hours. Patient not taking: Reported on 09/12/2018 06/09/18   Coralyn Mark, NP  meloxicam (MOBIC) 7.5 MG tablet Take 1 tablet (7.5 mg total) by mouth daily. Patient not taking: Reported on 02/02/2018 04/27/17   Caccavale, Sophia, PA-C  naproxen (NAPROSYN) 500 MG tablet Take 1 tablet (500 mg total) by mouth 2 (two) times daily with a meal. Patient not taking: Reported on 05/10/2018 04/09/18   Kem Boroughs B, FNP  omeprazole (PRILOSEC) 20 MG capsule Take 1 capsule (20 mg total) by mouth daily. Patient not taking: Reported on 02/02/2018 02/17/17   Antony Madura, PA-C  ondansetron (ZOFRAN ODT) 4 MG disintegrating tablet Take 1 tablet (4 mg total) by mouth every 8 (eight) hours as needed for nausea. Patient not taking: Reported on 09/12/2018 05/19/18   Garlon Hatchet, PA-C  PARoxetine (PAXIL) 20 MG tablet Take 1 tablet (20 mg total) by mouth every morning. Patient not taking: Reported on 02/02/2018 09/20/17   Bethann Berkshire, MD  sulfamethoxazole-trimethoprim (BACTRIM DS,SEPTRA DS) 800-160 MG tablet Take 2 tablets by mouth 2 (two) times daily. Patient not taking: Reported on 05/19/2018 05/14/18   Irean Hong, MD  terbinafine (LAMISIL) 1 % cream Apply 1 application topically 2 (two) times daily. Patient not taking: Reported on 05/10/2018 05/09/18   Tommi Rumps, PA-C  topiramate (TOPAMAX) 25  MG tablet Take one tablet at night for one week, then take 2 tablets at night for one week, then take 3 tablets at night. Patient not taking: Reported on 02/02/2018 11/12/16   Trixie Dredge, PA-C    Allergies    Fish allergy, Reglan [metoclopramide], and Tramadol  Review of Systems   Review of Systems  Constitutional: Negative for chills and fever.  HENT: Positive for dental problem.   Respiratory: Negative for shortness of breath.   Cardiovascular: Negative for chest pain.  Gastrointestinal: Negative for nausea and vomiting.  Neurological: Negative for  syncope.    Physical Exam Updated Vital Signs BP (!) 145/90 (BP Location: Right Arm)   Pulse 66   Temp 98.7 F (37.1 C) (Oral)   Resp 18   SpO2 100%   Physical Exam Vitals and nursing note reviewed.  Constitutional:      General: He is not in acute distress.    Appearance: He is well-developed. He is not toxic-appearing.  HENT:     Head: Normocephalic and atraumatic.     Right Ear: Tympanic membrane is not perforated, erythematous, retracted or bulging.     Left Ear: Tympanic membrane is not perforated, erythematous, retracted or bulging.     Nose: Nose normal.     Mouth/Throat:     Pharynx: Uvula midline. No oropharyngeal exudate or posterior oropharyngeal erythema.      Comments: Posterior oropharynx is symmetric appearing. Patient tolerating own secretions without difficulty. No trismus. No drooling. No hot potato voice. No swelling beneath the tongue, submandibular compartment is soft.  Eyes:     General:        Right eye: No discharge.        Left eye: No discharge.     Conjunctiva/sclera: Conjunctivae normal.  Cardiovascular:     Rate and Rhythm: Normal rate and regular rhythm.     Heart sounds: No murmur heard.   Pulmonary:     Effort: Pulmonary effort is normal.     Breath sounds: Normal breath sounds.  Musculoskeletal:     Cervical back: Normal range of motion and neck supple. No edema, erythema, rigidity or crepitus. No pain with movement.  Lymphadenopathy:     Cervical: No cervical adenopathy.  Neurological:     Mental Status: He is alert.  Psychiatric:        Behavior: Behavior normal.        Thought Content: Thought content normal.     ED Results / Procedures / Treatments   Labs (all labs ordered are listed, but only abnormal results are displayed) Labs Reviewed - No data to display  EKG None  Radiology No results found.  Procedures Procedures (including critical care time)  Medications Ordered in ED Medications  amoxicillin-clavulanate  (AUGMENTIN) 875-125 MG per tablet 1 tablet (has no administration in time range)  lidocaine (PF) (XYLOCAINE) 1 % injection 5 mL (has no administration in time range)  ketorolac (TORADOL) 15 MG/ML injection 15 mg (has no administration in time range)    ED Course  I have reviewed the triage vital signs and the nursing notes.  Pertinent labs & imaging results that were available during my care of the patient were reviewed by me and considered in my medical decision making (see chart for details).    MDM Rules/Calculators/A&P                         Patient presents with dental pain. Patient is nontoxic  appearing, vitals without significant abnormality- BP elevated mildly, doubt HTN emergency.  Exam unconcerning for Ludwig's angina or deep space infection.  Question early abscess to left upper gingiva, we discussed option of incision and drainage in the emergency department, risks/benefits discussed, patient ultimately declined. Will treat with Augmentin and Naproxen.  Urged patient to follow-up with dentist, dental resources were provided.  Discussed treatment plan and need for follow up as well as return precautions. Provided opportunity for questions, patient confirmed understanding and is agreeable to plan.  Final Clinical Impression(s) / ED Diagnoses Final diagnoses:  Pain, dental    Rx / DC Orders ED Discharge Orders         Ordered    amoxicillin-clavulanate (AUGMENTIN) 875-125 MG tablet  Every 12 hours     Discontinue  Reprint     03/29/20 0151    naproxen (NAPROSYN) 500 MG tablet  2 times daily     Discontinue  Reprint     03/29/20 0151           Regan Llorente, Glynda Jaeger, PA-C 03/29/20 0151  Following initial discussion patient changed his mind & elected to attempt I&D of left upper gum area.   INCISION AND DRAINAGE Performed by: Kennith Maes Consent: Verbal consent obtained. Risks and benefits: risks, benefits and alternatives were discussed Type: abscess  Body  area: L upper gingiva   Anesthesia: local infiltration  Incision was made with a scalpel.  Local anesthetic: lidocaine 1% wo epinephrine  Anesthetic total: 3 ml  Complexity: simple Needle aspiration with 18G needle.   Drainage: purulent/bloody  Drainage amount: mild  Patient tolerance: Patient tolerated the procedure well with no immediate complications.  Same plan of NSAIDs & abx with dentist follow up- patient confirmed understanding & is in agreement.        Leafy Kindle 03/29/20 0254    Merrily Pew, MD 03/29/20 [phone]

## 2020-03-29 NOTE — ED Triage Notes (Signed)
Patient arrived stating he has two dental abscesses over the last three days. Reports taking two Tylenol at 930pm

## 2020-03-29 NOTE — Discharge Instructions (Addendum)
Call one of the dentists offices provided to schedule an appointment for re-evaluation and further management within the next 48 hours.  ° °I have prescribed you Augmentin which is an antibiotic to treat the infection and Naproxen which is an anti-inflammatory medicine to treat the pain.  ° °Please take all of your antibiotics until finished. You may develop abdominal discomfort or diarrhea from the antibiotic.  You may help offset this with probiotics which you can buy at the store (ask your pharmacist if unable to find) or get probiotics in the form of eating yogurt. Do not eat or take the probiotics until 2 hours after your antibiotic. If you are unable to tolerate these side effects follow-up with your primary care provider or return to the emergency department.  ° °If you begin to experience any blistering, rashes, swelling, or difficulty breathing seek medical care for evaluation of potentially more serious side effects.  ° °Be sure to eat something when taking the Naproxen as it can cause stomach upset and at worst stomach bleeding. Do not take additional non steroidal anti-inflammatory medicines such as Ibuprofen, Aleve, Advil, Mobic, Diclofenac, or goodie powder while taking Naproxen. You may supplement with Tylenol.  ° °We have prescribed you new medication(s) today. Discuss the medications prescribed today with your pharmacist as they can have adverse effects and interactions with your other medicines including over the counter and prescribed medications. Seek medical evaluation if you start to experience new or abnormal symptoms after taking one of these medicines, seek care immediately if you start to experience difficulty breathing, feeling of your throat closing, facial swelling, or rash as these could be indications of a more serious allergic reaction ° °If you start to experience and new or worsening symptoms return to the emergency department. If you start to experience fever, chills, neck  stiffness/pain, or inability to move your neck or open your mouth come back to the emergency department immediately.  ° °

## 2021-03-11 ENCOUNTER — Encounter (HOSPITAL_COMMUNITY): Payer: Self-pay | Admitting: *Deleted

## 2021-03-11 ENCOUNTER — Other Ambulatory Visit: Payer: Self-pay

## 2021-03-11 ENCOUNTER — Emergency Department (HOSPITAL_COMMUNITY): Admission: EM | Admit: 2021-03-11 | Discharge: 2021-03-11 | Discharge status: Against Medical Advice | Payer: 59

## 2021-03-11 ENCOUNTER — Emergency Department (HOSPITAL_COMMUNITY)
Admission: EM | Admit: 2021-03-11 | Discharge: 2021-03-11 | Disposition: A | Discharge status: Home / Self Care | Payer: 59 | Attending: Emergency Medicine | Admitting: Emergency Medicine

## 2021-03-11 DIAGNOSIS — R110 Nausea: Secondary | ICD-10-CM | POA: Diagnosis not present

## 2021-03-11 DIAGNOSIS — R519 Headache, unspecified: Secondary | ICD-10-CM | POA: Insufficient documentation

## 2021-03-11 DIAGNOSIS — Z5321 Procedure and treatment not carried out due to patient leaving prior to being seen by health care provider: Secondary | ICD-10-CM | POA: Diagnosis not present

## 2021-03-11 NOTE — ED Triage Notes (Signed)
Pt is here for severe headache which he has had since he woke up this am.  Pt states that his headache is sharp and dull and he has had nausea with this and his eyes bother him. Pt has hx of headaches for 5 years and used to have prescription for anti anxiety meds and sumitriptan which he is now out of both.  Pt asks for prescription for this. Pt is alert and oriented.

## 2021-08-31 ENCOUNTER — Other Ambulatory Visit: Payer: Self-pay

## 2021-08-31 ENCOUNTER — Encounter (HOSPITAL_COMMUNITY): Payer: Self-pay

## 2021-08-31 ENCOUNTER — Emergency Department (HOSPITAL_COMMUNITY)
Admission: EM | Admit: 2021-08-31 | Discharge: 2021-09-01 | Disposition: A | Discharge status: Home / Self Care | Payer: Medicaid Other | Attending: Emergency Medicine | Admitting: Emergency Medicine

## 2021-08-31 ENCOUNTER — Emergency Department (HOSPITAL_COMMUNITY): Payer: Medicaid Other

## 2021-08-31 DIAGNOSIS — Z711 Person with feared health complaint in whom no diagnosis is made: Secondary | ICD-10-CM

## 2021-08-31 DIAGNOSIS — U071 COVID-19: Secondary | ICD-10-CM | POA: Insufficient documentation

## 2021-08-31 DIAGNOSIS — Z202 Contact with and (suspected) exposure to infections with a predominantly sexual mode of transmission: Secondary | ICD-10-CM | POA: Insufficient documentation

## 2021-08-31 NOTE — ED Triage Notes (Addendum)
Pt reports with sore throat, diarrhea, fever, SHOB, headache, and cough x 3 weeks.

## 2021-08-31 NOTE — ED Provider Notes (Addendum)
Emergency Medicine Provider Triage Evaluation Note  Nylan Staples , a 30 y.o. male  was evaluated in triage.  Pt complains of Unwell.  States he has had a cough for 2 to 3 weeks.  Productive of green sputum.  Did recently moved here and states his mother is now sick and now he feels like he has had fevers, congestion, rhinorrhea, scratchy throat.  No chest pain, shortness of breath.  No emesis.  Review of Systems  Positive: Cough, fever Negative: Cp, sob, abd pain  Physical Exam  BP 125/79 (BP Location: Left Arm)   Pulse (!) 106   Resp 17   SpO2 98%  Gen:   Awake, no distress   Resp:  Normal effort  MSK:   Moves extremities without difficulty  Other:    Medical Decision Making  Medically screening exam initiated at 11:17 PM.  Appropriate orders placed.  Henri Bozard was informed that the remainder of the evaluation will be completed by another provider, this initial triage assessment does not replace that evaluation, and the importance of remaining in the ED until their evaluation is complete.  UR sx  Patient requesting HIV/ std screen as well, denies symptoms       Hisako Bugh A, PA-C 08/31/21 2324    LongArlyss Repress, MD 09/02/21 1302

## 2021-09-01 LAB — HIV ANTIBODY (ROUTINE TESTING W REFLEX): HIV Screen 4th Generation wRfx: NONREACTIVE

## 2021-09-01 LAB — RESP PANEL BY RT-PCR (FLU A&B, COVID) ARPGX2
Influenza A by PCR: NEGATIVE
Influenza B by PCR: NEGATIVE
SARS Coronavirus 2 by RT PCR: POSITIVE — AB

## 2021-09-01 LAB — GROUP A STREP BY PCR: Group A Strep by PCR: NOT DETECTED

## 2021-09-01 LAB — RPR: RPR Ser Ql: NONREACTIVE

## 2021-09-01 MED ORDER — KETOROLAC TROMETHAMINE 15 MG/ML IJ SOLN
30.0000 mg | Freq: Once | INTRAMUSCULAR | Status: AC
  Administered 2021-09-01: 03:00:00 30 mg via INTRAMUSCULAR
  Filled 2021-09-01: qty 2

## 2021-09-01 NOTE — ED Provider Notes (Signed)
[facility]-EMERGENCY DEPT Provider Note   CSN: [id number] Arrival date & time: 08/31/21  2305     History Chief Complaint  Patient presents with   Sore Throat   Cough   Fever   Shortness of Breath   Diarrhea    Larry Estes is a 30 y.o. male.  30 year old male presents to the emergency department for complaint of URI symptoms.  He reports subjective fever, body aches, sore throat, cough.  While symptoms have been present x3 weeks, he noticed an acute worsening to his symptoms this past Wednesday.  They have remained constant and unchanged.  Unaware of sick contacts; however, mother is now sick with similar symptoms. No associated chest pain, shortness of breath, abdominal pain, vomiting.  The history is provided by the patient. No language interpreter was used.  Sore Throat Associated symptoms include shortness of breath.  Cough Associated symptoms: fever and shortness of breath   Fever Associated symptoms: cough and diarrhea   Shortness of Breath Associated symptoms: cough and fever   Diarrhea Associated symptoms: fever       Past Medical History:  Diagnosis Date   Allergic angiitis (HCC)    Allergy    Anxiety    Headache 12/26/2015   Multiple allergies    Sickle cell trait [facility])     Patient Active Problem List   Diagnosis Date Noted   Headache 12/26/2015   Myalgia 12/11/2015    History reviewed. No pertinent surgical history.     Family History  Problem Relation Age of Onset   Hypertension Mother    Hypertension Maternal Grandmother    Hypertension Paternal Grandmother    CAD Other    Hypertension Other    Diabetes Other     Social History   Tobacco Use   Smoking status: Never   Smokeless tobacco: Never  Substance Use Topics   Alcohol use: Yes    Comment: socially   Drug use: No    Home Medications Prior to Admission medications   Medication Sig Start Date End Date Taking? Authorizing Provider   amoxicillin-clavulanate (AUGMENTIN) 875-125 MG tablet Take 1 tablet by mouth every 12 (twelve) hours. 03/29/20   Petrucelli, Samantha R, PA-C  naproxen (NAPROSYN) 500 MG tablet Take 1 tablet (500 mg total) by mouth 2 (two) times daily. 03/29/20   Petrucelli, Samantha R, PA-C  fluticasone (FLONASE) 50 MCG/ACT nasal spray Place 1 spray into both nostrils daily. Patient not taking: Reported on 09/12/2018 05/23/18 03/29/20  Caccavale, Sophia, PA-C  omeprazole (PRILOSEC) 20 MG capsule Take 1 capsule (20 mg total) by mouth daily. Patient not taking: Reported on 02/02/2018 02/17/17 03/29/20  Antony Madura, PA-C  PARoxetine (PAXIL) 20 MG tablet Take 1 tablet (20 mg total) by mouth every morning. Patient not taking: Reported on 02/02/2018 09/20/17 03/29/20  Bethann Berkshire, MD  topiramate (TOPAMAX) 25 MG tablet Take one tablet at night for one week, then take 2 tablets at night for one week, then take 3 tablets at night. Patient not taking: Reported on 02/02/2018 11/12/16 03/29/20  Trixie Dredge, PA-C    Allergies    Fish allergy, Reglan [metoclopramide], and Tramadol  Review of Systems   Review of Systems  Constitutional:  Positive for fever.  Respiratory:  Positive for cough and shortness of breath.   Gastrointestinal:  Positive for diarrhea.  Ten systems reviewed and are negative for acute change, except as noted in the HPI.    Physical Exam Updated Vital Signs BP 123/73  Pulse 72   Temp 98.3 F (36.8 C) (Oral)   Resp 17   Ht 5\' 7"  (1.702 m)   Wt 73.6 kg   SpO2 100%   BMI 25.42 kg/m   Physical Exam Vitals and nursing note reviewed.  Constitutional:      General: He is not in acute distress.    Appearance: He is well-developed. He is not diaphoretic.     Comments: Nontoxic-appearing and in no distress  HENT:     Head: Normocephalic and atraumatic.     Mouth/Throat:     Comments: No voice muffling.  Tolerating secretions without difficulty.  No tripoding, stridor. Eyes:     General: No  scleral icterus.    Conjunctiva/sclera: Conjunctivae normal.  Pulmonary:     Effort: Pulmonary effort is normal. No respiratory distress.     Breath sounds: No stridor. No wheezing.     Comments: Respirations even and unlabored Musculoskeletal:        General: Normal range of motion.     Cervical back: Normal range of motion.  Skin:    General: Skin is warm and dry.     Coloration: Skin is not pale.     Findings: No erythema or rash.  Neurological:     Mental Status: He is alert and oriented to person, place, and time.     Coordination: Coordination normal.  Psychiatric:        Behavior: Behavior normal.    ED Results / Procedures / Treatments   Labs (all labs ordered are listed, but only abnormal results are displayed) Labs Reviewed  RESP PANEL BY RT-PCR (FLU A&B, COVID) ARPGX2 - Abnormal; Notable for the following components:      Result Value   SARS Coronavirus 2 by RT PCR POSITIVE (*)    All other components within normal limits  GROUP A STREP BY PCR  RPR  HIV ANTIBODY (ROUTINE TESTING W REFLEX)  GC/CHLAMYDIA PROBE AMP ([place]) NOT AT [facility]    EKG None  Radiology DG Chest 2 View  Result Date: 08/31/2021 CLINICAL DATA:  Cough EXAM: CHEST - 2 VIEW COMPARISON:  Chest x-ray 05/10/2018, CT chest 08/12/2016. FINDINGS: The heart and mediastinal contours are within normal limits. No focal consolidation. No pulmonary edema. No pleural effusion. No pneumothorax. No acute osseous abnormality. IMPRESSION: No active cardiopulmonary disease. Electronically Signed   By: 13/04/2016 M.D.   On: 08/31/2021 23:51    Procedures Procedures   Medications Ordered in ED Medications  ketorolac (TORADOL) 15 MG/ML injection 30 mg (has no administration in time range)    ED Course  I have reviewed the triage vital signs and the nursing notes.  Pertinent labs & imaging results that were available during my care of the patient were reviewed by me and considered in my medical  decision making (see chart for details).    MDM Rules/Calculators/A&P                           30 year old male presenting to the emergency department for URI symptoms.  Reports worsening of his symptoms since Wednesday.  No signs of respiratory distress.  No hypoxia.  He denies chest pain, shortness of breath.  Chest x-ray is negative for acute cardiopulmonary abnormality.  The patient has tested positive for COVID-19 in the emergency department.  This is consistent with his symptomatic complaints.  Advised supportive care and PCP follow up.  Return precautions discussed and provided. Patient  discharged in stable condition with no unaddressed concerns.  Larry Estes was evaluated in Emergency Department on 09/01/2021 for the symptoms described in the history of present illness. He was evaluated in the context of the global COVID-19 pandemic, which necessitated consideration that the patient might be at risk for infection with the SARS-CoV-2 virus that causes COVID-19. Institutional protocols and algorithms that pertain to the evaluation of patients at risk for COVID-19 are in a state of rapid change based on information released by regulatory bodies including the CDC and federal and state organizations. These policies and algorithms were followed during the patient's care in the ED.   Final Clinical Impression(s) / ED Diagnoses Final diagnoses:  COVID-19 virus infection  Concern about STD in male without diagnosis    Rx / DC Orders ED Discharge Orders     None        Antony Madura, PA-C 09/01/21 7517    Geoffery Lyons, MD 09/01/21 [phone]

## 2021-09-01 NOTE — Discharge Instructions (Addendum)
You have tested positive for COVID in the ED. Take tylenol or ibuprofen for fever, headaches, body aches. Drink plenty of fluids to prevent dehydration. You may continue to use other over-the-counter remedies for symptom control, if desired. Return for new or concerning symptoms such as worsening shortness of breath, coughing up blood, persistent vomiting, loss of consciousness.  Follow up on your STD test results in MyChart.

## 2021-09-02 LAB — GC/CHLAMYDIA PROBE AMP (~~LOC~~) NOT AT ARMC
Chlamydia: NEGATIVE
Comment: NEGATIVE
Comment: NORMAL
Neisseria Gonorrhea: NEGATIVE
